# Patient Record
Sex: Female | Born: 1935 | Race: White | Hispanic: No | Smoking: Former smoker
Health system: Southern US, Community
[De-identification: ages and names within clinical notes are randomized; demographics above are authoritative.]

## PROBLEM LIST (undated history)

## (undated) DIAGNOSIS — R0602 Shortness of breath: Secondary | ICD-10-CM

## (undated) DIAGNOSIS — H269 Unspecified cataract: Secondary | ICD-10-CM

## (undated) DIAGNOSIS — IMO0001 Reserved for inherently not codable concepts without codable children: Secondary | ICD-10-CM

## (undated) DIAGNOSIS — M545 Low back pain, unspecified: Secondary | ICD-10-CM

## (undated) DIAGNOSIS — F329 Major depressive disorder, single episode, unspecified: Secondary | ICD-10-CM

## (undated) DIAGNOSIS — I350 Nonrheumatic aortic (valve) stenosis: Secondary | ICD-10-CM

## (undated) DIAGNOSIS — E7800 Pure hypercholesterolemia, unspecified: Secondary | ICD-10-CM

## (undated) DIAGNOSIS — E119 Type 2 diabetes mellitus without complications: Secondary | ICD-10-CM

## (undated) DIAGNOSIS — N183 Chronic kidney disease, stage 3 unspecified: Secondary | ICD-10-CM

## (undated) DIAGNOSIS — J449 Chronic obstructive pulmonary disease, unspecified: Secondary | ICD-10-CM

## (undated) DIAGNOSIS — M1990 Unspecified osteoarthritis, unspecified site: Secondary | ICD-10-CM

## (undated) DIAGNOSIS — E559 Vitamin D deficiency, unspecified: Secondary | ICD-10-CM

## (undated) DIAGNOSIS — G709 Myoneural disorder, unspecified: Secondary | ICD-10-CM

## (undated) DIAGNOSIS — D509 Iron deficiency anemia, unspecified: Secondary | ICD-10-CM

## (undated) DIAGNOSIS — H35059 Retinal neovascularization, unspecified, unspecified eye: Secondary | ICD-10-CM

## (undated) DIAGNOSIS — H353290 Exudative age-related macular degeneration, unspecified eye, stage unspecified: Secondary | ICD-10-CM

## (undated) DIAGNOSIS — I447 Left bundle-branch block, unspecified: Secondary | ICD-10-CM

## (undated) DIAGNOSIS — K219 Gastro-esophageal reflux disease without esophagitis: Secondary | ICD-10-CM

## (undated) DIAGNOSIS — G8929 Other chronic pain: Secondary | ICD-10-CM

## (undated) DIAGNOSIS — F32A Depression, unspecified: Secondary | ICD-10-CM

## (undated) DIAGNOSIS — F0280 Dementia in other diseases classified elsewhere without behavioral disturbance: Secondary | ICD-10-CM

## (undated) DIAGNOSIS — H3530 Unspecified macular degeneration: Secondary | ICD-10-CM

## (undated) DIAGNOSIS — Z0389 Encounter for observation for other suspected diseases and conditions ruled out: Secondary | ICD-10-CM

## (undated) DIAGNOSIS — M1710 Unilateral primary osteoarthritis, unspecified knee: Secondary | ICD-10-CM

## (undated) DIAGNOSIS — I10 Essential (primary) hypertension: Secondary | ICD-10-CM

## (undated) DIAGNOSIS — I480 Paroxysmal atrial fibrillation: Secondary | ICD-10-CM

## (undated) DIAGNOSIS — I5042 Chronic combined systolic (congestive) and diastolic (congestive) heart failure: Secondary | ICD-10-CM

## (undated) DIAGNOSIS — G309 Alzheimer's disease, unspecified: Secondary | ICD-10-CM

## (undated) DIAGNOSIS — E785 Hyperlipidemia, unspecified: Secondary | ICD-10-CM

## (undated) DIAGNOSIS — I428 Other cardiomyopathies: Secondary | ICD-10-CM

## (undated) HISTORY — DX: Reserved for inherently not codable concepts without codable children: IMO0001

## (undated) HISTORY — DX: Low back pain: M54.5

## (undated) HISTORY — DX: Gastro-esophageal reflux disease without esophagitis: K21.9

## (undated) HISTORY — DX: Chronic kidney disease, stage 3 unspecified: N18.30

## (undated) HISTORY — DX: Pure hypercholesterolemia, unspecified: E78.00

## (undated) HISTORY — DX: Type 2 diabetes mellitus without complications: E11.9

## (undated) HISTORY — DX: Low back pain, unspecified: M54.50

## (undated) HISTORY — DX: Other cardiomyopathies: I42.8

## (undated) HISTORY — DX: Iron deficiency anemia, unspecified: D50.9

## (undated) HISTORY — DX: Alzheimer's disease, unspecified: G30.9

## (undated) HISTORY — DX: Dementia in other diseases classified elsewhere, unspecified severity, without behavioral disturbance, psychotic disturbance, mood disturbance, and anxiety: F02.80

## (undated) HISTORY — DX: Major depressive disorder, single episode, unspecified: F32.9

## (undated) HISTORY — DX: Depression, unspecified: F32.A

## (undated) HISTORY — DX: Exudative age-related macular degeneration, unspecified eye, stage unspecified: H35.3290

## (undated) HISTORY — DX: Chronic obstructive pulmonary disease, unspecified: J44.9

## (undated) HISTORY — DX: Unilateral primary osteoarthritis, unspecified knee: M17.10

## (undated) HISTORY — DX: Vitamin D deficiency, unspecified: E55.9

## (undated) HISTORY — PX: TONSILLECTOMY: SUR1361

## (undated) HISTORY — DX: Unspecified osteoarthritis, unspecified site: M19.90

## (undated) HISTORY — DX: Unspecified cataract: H26.9

## (undated) HISTORY — DX: Chronic kidney disease, stage 3 (moderate): N18.3

## (undated) HISTORY — DX: Retinal neovascularization, unspecified, unspecified eye: H35.059

## (undated) HISTORY — DX: Paroxysmal atrial fibrillation: I48.0

## (undated) HISTORY — DX: Hyperlipidemia, unspecified: E78.5

## (undated) HISTORY — DX: Chronic combined systolic (congestive) and diastolic (congestive) heart failure: I50.42

## (undated) HISTORY — DX: Nonrheumatic aortic (valve) stenosis: I35.0

## (undated) HISTORY — DX: Encounter for observation for other suspected diseases and conditions ruled out: Z03.89

## (undated) HISTORY — DX: Essential (primary) hypertension: I10

## (undated) HISTORY — DX: Left bundle-branch block, unspecified: I44.7

---
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
## 1968-03-15 HISTORY — PX: ABDOMINAL HYSTERECTOMY: SUR658

## 1995-03-16 HISTORY — PX: KNEE SURGERY: SHX244

## 2005-11-16 ENCOUNTER — Encounter: Admission: RE | Admit: 2005-11-16 | Discharge: 2005-11-16 | Payer: Self-pay | Admitting: Internal Medicine

## 2005-12-28 ENCOUNTER — Encounter: Admission: RE | Admit: 2005-12-28 | Discharge: 2005-12-28 | Payer: Self-pay | Admitting: Internal Medicine

## 2006-06-26 ENCOUNTER — Inpatient Hospital Stay (HOSPITAL_COMMUNITY): Admission: EM | Admit: 2006-06-26 | Discharge: 2006-06-30 | Payer: Self-pay | Admitting: Emergency Medicine

## 2006-06-27 ENCOUNTER — Encounter (INDEPENDENT_AMBULATORY_CARE_PROVIDER_SITE_OTHER): Payer: Self-pay | Admitting: Cardiology

## 2006-06-28 ENCOUNTER — Ambulatory Visit: Payer: Self-pay | Admitting: Infectious Diseases

## 2007-10-27 ENCOUNTER — Encounter: Payer: Self-pay | Admitting: Cardiovascular Disease

## 2007-10-27 ENCOUNTER — Inpatient Hospital Stay (HOSPITAL_COMMUNITY): Admission: EM | Admit: 2007-10-27 | Discharge: 2007-10-29 | Payer: Self-pay | Admitting: Emergency Medicine

## 2007-12-25 ENCOUNTER — Inpatient Hospital Stay (HOSPITAL_COMMUNITY): Admission: EM | Admit: 2007-12-25 | Discharge: 2007-12-30 | Payer: Self-pay | Admitting: Emergency Medicine

## 2008-01-08 ENCOUNTER — Encounter: Admission: RE | Admit: 2008-01-08 | Discharge: 2008-01-08 | Payer: Self-pay | Admitting: Gastroenterology

## 2008-06-10 ENCOUNTER — Emergency Department (HOSPITAL_COMMUNITY): Admission: EM | Admit: 2008-06-10 | Discharge: 2008-06-10 | Payer: Self-pay | Admitting: Emergency Medicine

## 2009-01-09 ENCOUNTER — Ambulatory Visit: Payer: Self-pay | Admitting: Pulmonary Disease

## 2009-01-09 DIAGNOSIS — J309 Allergic rhinitis, unspecified: Secondary | ICD-10-CM | POA: Insufficient documentation

## 2009-01-09 DIAGNOSIS — R0602 Shortness of breath: Secondary | ICD-10-CM | POA: Insufficient documentation

## 2009-01-09 DIAGNOSIS — E119 Type 2 diabetes mellitus without complications: Secondary | ICD-10-CM | POA: Insufficient documentation

## 2009-01-09 DIAGNOSIS — I5040 Unspecified combined systolic (congestive) and diastolic (congestive) heart failure: Secondary | ICD-10-CM

## 2009-01-09 DIAGNOSIS — E785 Hyperlipidemia, unspecified: Secondary | ICD-10-CM

## 2009-01-09 DIAGNOSIS — I10 Essential (primary) hypertension: Secondary | ICD-10-CM | POA: Insufficient documentation

## 2009-01-25 ENCOUNTER — Inpatient Hospital Stay (HOSPITAL_COMMUNITY): Admission: EM | Admit: 2009-01-25 | Discharge: 2009-01-27 | Payer: Self-pay | Admitting: Emergency Medicine

## 2009-01-27 ENCOUNTER — Encounter (INDEPENDENT_AMBULATORY_CARE_PROVIDER_SITE_OTHER): Payer: Self-pay | Admitting: Emergency Medicine

## 2009-02-14 ENCOUNTER — Encounter: Payer: Self-pay | Admitting: Pulmonary Disease

## 2009-02-14 ENCOUNTER — Ambulatory Visit: Payer: Self-pay | Admitting: Pulmonary Disease

## 2009-03-13 ENCOUNTER — Encounter: Admission: RE | Admit: 2009-03-13 | Discharge: 2009-03-13 | Payer: Self-pay | Admitting: Internal Medicine

## 2009-03-24 ENCOUNTER — Encounter (HOSPITAL_COMMUNITY): Admission: RE | Admit: 2009-03-24 | Discharge: 2009-06-18 | Payer: Self-pay | Admitting: Cardiovascular Disease

## 2009-07-08 ENCOUNTER — Ambulatory Visit: Payer: Self-pay | Admitting: Pulmonary Disease

## 2009-12-24 ENCOUNTER — Encounter: Admission: RE | Admit: 2009-12-24 | Discharge: 2009-12-24 | Payer: Self-pay | Admitting: Internal Medicine

## 2009-12-31 ENCOUNTER — Ambulatory Visit: Payer: Self-pay | Admitting: Oncology

## 2010-01-03 ENCOUNTER — Inpatient Hospital Stay (HOSPITAL_COMMUNITY): Admission: EM | Admit: 2010-01-03 | Discharge: 2010-01-06 | Payer: Self-pay | Admitting: Emergency Medicine

## 2010-01-05 ENCOUNTER — Encounter (INDEPENDENT_AMBULATORY_CARE_PROVIDER_SITE_OTHER): Payer: Self-pay | Admitting: Cardiovascular Disease

## 2010-02-13 ENCOUNTER — Encounter: Payer: Self-pay | Admitting: Pulmonary Disease

## 2010-03-18 ENCOUNTER — Encounter
Admission: RE | Admit: 2010-03-18 | Discharge: 2010-03-18 | Payer: Self-pay | Source: Home / Self Care | Attending: Internal Medicine | Admitting: Internal Medicine

## 2010-04-04 ENCOUNTER — Encounter (HOSPITAL_BASED_OUTPATIENT_CLINIC_OR_DEPARTMENT_OTHER): Payer: Self-pay | Admitting: Internal Medicine

## 2010-04-05 ENCOUNTER — Encounter (HOSPITAL_BASED_OUTPATIENT_CLINIC_OR_DEPARTMENT_OTHER): Payer: Self-pay | Admitting: Internal Medicine

## 2010-04-14 NOTE — Assessment & Plan Note (Signed)
Summary: rov for dyspnea   Copy to:  [name] Primary Provider/Referring Provider:  [name]  CC:  Dyspnea follow-up.  The patient says she is doing well off Spiriva and Advair. She is not having to use her Proair much but does say her breathing is a little worse  due to the pollen.Marland Kitchen  History of Present Illness: The pt comes in today for f/u of her dyspnea.  At the last visit, she was taken off maintenance bronchodilators due to minimal abnormalities on her pfts.  She has been using as needed proair, and feels she is doing well with this.  She uses only intermittantly, and has not used in over a week.  She denies cough, congestion, or mucus.  She has been thru cardiac rehab, and feels it has helped her.  Current Medications (verified): 1)  Lantus 100 Unit/ml Soln (Insulin Glargine) .... Use As Directed 2)  Metoprolol Tartrate 25 Mg Tabs (Metoprolol Tartrate) .... Take 1 Tablet By Mouth Two Times A Day 3)  Furosemide 20 Mg Tabs (Furosemide) .... Take 1 Tablet By Mouth Two Times A Day 4)  Bupropion Hcl 75 Mg Tabs (Bupropion Hcl) .... Take 2 Tabs( Total of 150mg )  By Mouth Two Times A Day 5)  Calcium + Vitamin D .... Take 1 Tablet By Mouth Two Times A Day 6)  Lisinopril 20 Mg Tabs (Lisinopril) .... Take 1 Tablet By Mouth Once A Day 7)  Lovastatin 40 Mg Tabs (Lovastatin) .... Take 1 Tablet By Mouth Once A Day 8)  Hydrocodone 500mg  .... Take 1 Tab By Mouth Every 6 Hours As Needed 9)  Famotidine 20 Mg Tabs (Famotidine) .... Take 1 Tablet By Mouth Two Times A Day 10)  Proair Hfa 108 (90 Base) Mcg/act  Aers (Albuterol Sulfate) .Marland Kitchen.. 1-2 Puffs Every 4-6 Hours As Needed 11)  Nystatin Cream .... Use Two Times A Day As Needed 12)  Amlodipine Besylate 2.5 Mg Tabs (Amlodipine Besylate) .... Take 1 Tablet By Mouth Once A Day 13)  Colace 100 Mg Caps (Docusate Sodium) .... Take 1 Tablet By Mouth Once A Day  Allergies (verified): 1)  ! Paxil  Review of Systems      See HPI  Vital  Signs:  Patient profile:   77 year old female Height:      62 inches (157.48 cm) Weight:      207 pounds (94.09 kg) BMI:     38.00 O2 Sat:      98 % on Room air Temp:     98.0 degrees F (36.67 degrees C) oral Pulse rate:   98 / minute BP sitting:   126 / 80  (right arm) Cuff size:   regular  Vitals Entered By: Michel Bickers CMA (July 08, 2009 9:09 AM)  O2 Sat at Rest %:  98 O2 Flow:  Room air CC: Dyspnea follow-up.  The patient says she is doing well off Spiriva and Advair. She is not having to use her Proair much but does say her breathing is a little worse  due to the pollen. Comments Medications reviewed with the patient. Michel Bickers CMA  July 08, 2009 9:10 AM   Physical Exam  General:  obese female in nad Lungs:  clear to auscultation Heart:  rrr   Impression & Recommendations:  Problem # 1:  DYSPNEA (ICD-786.05)  multifactorial, and related to her weight, deconditioning, and chronic heart disease.  She has minimal airtrapping on pfts and no fixed airflow obstruction by spirometry.  She has been doing well on as needed albuterol alone, and uses it rarely.  She has participated in cardiac rehab and has lost 3 pounds since last visit, and feels better.  I will see her on an as needed basis.  Other Orders: Est. Patient Level II (16109)  Patient Instructions: 1)  continue with proair as needed. 2)  work on weight loss and conditioning 3)  followup with me as needed.

## 2010-04-22 NOTE — Letter (Signed)
Summary: [facility]  [facility]   Imported By: Lester Chalfant 04/16/2010 10:18:59  _____________________________________________________________________  External Attachment:    Type:   Image     Comment:   External Document

## 2010-04-29 ENCOUNTER — Inpatient Hospital Stay (HOSPITAL_COMMUNITY)
Admission: EM | Admit: 2010-04-29 | Discharge: 2010-05-04 | DRG: 683 | Disposition: A | Discharge status: Home Health | Payer: MEDICARE | Attending: Internal Medicine | Admitting: Internal Medicine

## 2010-04-29 ENCOUNTER — Emergency Department (HOSPITAL_COMMUNITY): Payer: MEDICARE

## 2010-04-29 DIAGNOSIS — Z87891 Personal history of nicotine dependence: Secondary | ICD-10-CM

## 2010-04-29 DIAGNOSIS — Z96659 Presence of unspecified artificial knee joint: Secondary | ICD-10-CM

## 2010-04-29 DIAGNOSIS — E860 Dehydration: Secondary | ICD-10-CM | POA: Diagnosis present

## 2010-04-29 DIAGNOSIS — E785 Hyperlipidemia, unspecified: Secondary | ICD-10-CM | POA: Diagnosis present

## 2010-04-29 DIAGNOSIS — J4489 Other specified chronic obstructive pulmonary disease: Secondary | ICD-10-CM | POA: Diagnosis present

## 2010-04-29 DIAGNOSIS — Z7901 Long term (current) use of anticoagulants: Secondary | ICD-10-CM

## 2010-04-29 DIAGNOSIS — I10 Essential (primary) hypertension: Secondary | ICD-10-CM | POA: Diagnosis present

## 2010-04-29 DIAGNOSIS — I447 Left bundle-branch block, unspecified: Secondary | ICD-10-CM | POA: Diagnosis present

## 2010-04-29 DIAGNOSIS — I428 Other cardiomyopathies: Secondary | ICD-10-CM | POA: Diagnosis present

## 2010-04-29 DIAGNOSIS — J449 Chronic obstructive pulmonary disease, unspecified: Secondary | ICD-10-CM | POA: Diagnosis present

## 2010-04-29 DIAGNOSIS — N179 Acute kidney failure, unspecified: Principal | ICD-10-CM | POA: Diagnosis present

## 2010-04-29 DIAGNOSIS — I4891 Unspecified atrial fibrillation: Secondary | ICD-10-CM | POA: Diagnosis present

## 2010-04-29 DIAGNOSIS — Z79899 Other long term (current) drug therapy: Secondary | ICD-10-CM

## 2010-04-29 DIAGNOSIS — I5032 Chronic diastolic (congestive) heart failure: Secondary | ICD-10-CM | POA: Diagnosis present

## 2010-04-29 DIAGNOSIS — Z880 Allergy status to penicillin: Secondary | ICD-10-CM

## 2010-04-29 DIAGNOSIS — N390 Urinary tract infection, site not specified: Secondary | ICD-10-CM | POA: Diagnosis present

## 2010-04-29 DIAGNOSIS — I959 Hypotension, unspecified: Secondary | ICD-10-CM | POA: Diagnosis present

## 2010-04-29 DIAGNOSIS — Z794 Long term (current) use of insulin: Secondary | ICD-10-CM

## 2010-04-29 DIAGNOSIS — E119 Type 2 diabetes mellitus without complications: Secondary | ICD-10-CM | POA: Diagnosis present

## 2010-04-29 DIAGNOSIS — E7800 Pure hypercholesterolemia, unspecified: Secondary | ICD-10-CM | POA: Diagnosis present

## 2010-04-29 LAB — CBC
Hemoglobin: 12.1 g/dL (ref 12.0–15.0)
MCH: 32.9 pg (ref 26.0–34.0)
Platelets: 123 10*3/uL — ABNORMAL LOW (ref 150–400)
RBC: 3.68 MIL/uL — ABNORMAL LOW (ref 3.87–5.11)
WBC: 7.1 10*3/uL (ref 4.0–10.5)

## 2010-04-29 LAB — BASIC METABOLIC PANEL
CO2: 27 mEq/L (ref 19–32)
Chloride: 100 mEq/L (ref 96–112)
Creatinine, Ser: 2.73 mg/dL — ABNORMAL HIGH (ref 0.4–1.2)
GFR calc Af Amer: 20 mL/min — ABNORMAL LOW (ref >60)
Potassium: 3.2 mEq/L — ABNORMAL LOW (ref 3.5–5.1)
Sodium: 142 mEq/L (ref 135–145)

## 2010-04-29 LAB — DIFFERENTIAL
Basophils Relative: 1 % (ref 0–1)
Lymphs Abs: 1.1 10*3/uL (ref 0.7–4.0)
Monocytes Relative: 12 % (ref 3–12)
Neutro Abs: 4.9 10*3/uL (ref 1.7–7.7)
Neutrophils Relative %: 70 % (ref 43–77)

## 2010-04-30 ENCOUNTER — Emergency Department (HOSPITAL_COMMUNITY): Payer: MEDICARE

## 2010-04-30 LAB — CREATININE, URINE, RANDOM: Creatinine, Urine: 49.5 mg/dL

## 2010-04-30 LAB — AMMONIA: Ammonia: 80 umol/L — ABNORMAL HIGH (ref 11–35)

## 2010-04-30 LAB — CK TOTAL AND CKMB (NOT AT ARMC)
CK, MB: 8.8 ng/mL (ref 0.3–4.0)
Relative Index: 2.4 (ref 0.0–2.5)

## 2010-04-30 LAB — URINALYSIS, ROUTINE W REFLEX MICROSCOPIC
Hgb urine dipstick: NEGATIVE
Specific Gravity, Urine: 1.013 (ref 1.005–1.030)
Urine Glucose, Fasting: NEGATIVE mg/dL
pH: 6.5 (ref 5.0–8.0)

## 2010-04-30 LAB — LIPID PANEL: VLDL: 17 mg/dL (ref 0–40)

## 2010-04-30 LAB — URINE MICROSCOPIC-ADD ON

## 2010-04-30 LAB — COMPREHENSIVE METABOLIC PANEL
ALT: 18 U/L (ref 0–35)
Albumin: 3.5 g/dL (ref 3.5–5.2)
Alkaline Phosphatase: 48 U/L (ref 39–117)
BUN: 86 mg/dL — ABNORMAL HIGH (ref 6–23)
Calcium: 9.2 mg/dL (ref 8.4–10.5)
Glucose, Bld: 215 mg/dL — ABNORMAL HIGH (ref 70–99)
Potassium: 3.7 mEq/L (ref 3.5–5.1)
Sodium: 145 mEq/L (ref 135–145)
Total Protein: 5.9 g/dL — ABNORMAL LOW (ref 6.0–8.3)

## 2010-04-30 LAB — CBC
Hemoglobin: 10.7 g/dL — ABNORMAL LOW (ref 12.0–15.0)
MCH: 32.3 pg (ref 26.0–34.0)
Platelets: 94 10*3/uL — ABNORMAL LOW (ref 150–400)
RBC: 3.31 MIL/uL — ABNORMAL LOW (ref 3.87–5.11)

## 2010-04-30 LAB — CARDIAC PANEL(CRET KIN+CKTOT+MB+TROPI)
Relative Index: 2.5 (ref 0.0–2.5)
Relative Index: 2.5 (ref 0.0–2.5)
Total CK: 536 U/L — ABNORMAL HIGH (ref 7–177)
Troponin I: 0.02 ng/mL (ref 0.00–0.06)
Troponin I: 0.04 ng/mL (ref 0.00–0.06)

## 2010-04-30 LAB — LIPASE, BLOOD: Lipase: 22 U/L (ref 11–59)

## 2010-04-30 LAB — GLUCOSE, CAPILLARY
Glucose-Capillary: 118 mg/dL — ABNORMAL HIGH (ref 70–99)
Glucose-Capillary: 173 mg/dL — ABNORMAL HIGH (ref 70–99)

## 2010-04-30 LAB — SODIUM, URINE, RANDOM: Sodium, Ur: 58 mEq/L

## 2010-04-30 LAB — TROPONIN I: Troponin I: 0.02 ng/mL (ref 0.00–0.06)

## 2010-04-30 LAB — TSH: TSH: 0.706 u[IU]/mL (ref 0.350–4.500)

## 2010-04-30 LAB — MAGNESIUM: Magnesium: 2.4 mg/dL (ref 1.5–2.5)

## 2010-04-30 LAB — PROTIME-INR: INR: 2.55 — ABNORMAL HIGH (ref 0.00–1.49)

## 2010-05-01 LAB — CBC
HCT: 29.2 % — ABNORMAL LOW (ref 36.0–46.0)
Hemoglobin: 10 g/dL — ABNORMAL LOW (ref 12.0–15.0)
MCHC: 34.2 g/dL (ref 30.0–36.0)
MCV: 94.5 fL (ref 78.0–100.0)
RDW: 13.7 % (ref 11.5–15.5)

## 2010-05-01 LAB — PROTIME-INR: INR: 2.72 — ABNORMAL HIGH (ref 0.00–1.49)

## 2010-05-01 LAB — URINE CULTURE: Culture  Setup Time: 201202160109

## 2010-05-01 LAB — GLUCOSE, CAPILLARY
Glucose-Capillary: 184 mg/dL — ABNORMAL HIGH (ref 70–99)
Glucose-Capillary: 187 mg/dL — ABNORMAL HIGH (ref 70–99)
Glucose-Capillary: 155 mg/dL — ABNORMAL HIGH (ref 70–99)

## 2010-05-01 LAB — BASIC METABOLIC PANEL
BUN: 71 mg/dL — ABNORMAL HIGH (ref 6–23)
CO2: 27 mEq/L (ref 19–32)
Chloride: 113 mEq/L — ABNORMAL HIGH (ref 96–112)
GFR calc Af Amer: 36 mL/min — ABNORMAL LOW (ref >60)
Potassium: 3.8 mEq/L (ref 3.5–5.1)

## 2010-05-02 LAB — PROTIME-INR
INR: 2.72 — ABNORMAL HIGH (ref 0.00–1.49)
Prothrombin Time: 28.9 seconds — ABNORMAL HIGH (ref 11.6–15.2)

## 2010-05-02 LAB — BASIC METABOLIC PANEL
BUN: 36 mg/dL — ABNORMAL HIGH (ref 6–23)
Calcium: 8.3 mg/dL — ABNORMAL LOW (ref 8.4–10.5)
Creatinine, Ser: 1.17 mg/dL (ref 0.4–1.2)
GFR calc non Af Amer: 45 mL/min — ABNORMAL LOW (ref >60)
Glucose, Bld: 116 mg/dL — ABNORMAL HIGH (ref 70–99)

## 2010-05-02 LAB — GLUCOSE, CAPILLARY
Glucose-Capillary: 129 mg/dL — ABNORMAL HIGH (ref 70–99)
Glucose-Capillary: 148 mg/dL — ABNORMAL HIGH (ref 70–99)

## 2010-05-02 LAB — CBC
MCH: 33 pg (ref 26.0–34.0)
MCHC: 34.6 g/dL (ref 30.0–36.0)
Platelets: 70 10*3/uL — ABNORMAL LOW (ref 150–400)

## 2010-05-03 LAB — GLUCOSE, CAPILLARY
Glucose-Capillary: 94 mg/dL (ref 70–99)
Glucose-Capillary: 144 mg/dL — ABNORMAL HIGH (ref 70–99)
Glucose-Capillary: 176 mg/dL — ABNORMAL HIGH (ref 70–99)
Glucose-Capillary: 126 mg/dL — ABNORMAL HIGH (ref 70–99)

## 2010-05-03 LAB — BASIC METABOLIC PANEL
Chloride: 116 mEq/L — ABNORMAL HIGH (ref 96–112)
Creatinine, Ser: 0.96 mg/dL (ref 0.4–1.2)
GFR calc Af Amer: >60 mL/min (ref >60)
Potassium: 4.6 mEq/L (ref 3.5–5.1)
Sodium: 145 mEq/L (ref 135–145)

## 2010-05-03 LAB — PROTIME-INR: INR: 3.1 — ABNORMAL HIGH (ref 0.00–1.49)

## 2010-05-04 LAB — CBC: WBC: 6.5 10*3/uL (ref 4.0–10.5)

## 2010-05-04 LAB — GLUCOSE, CAPILLARY
Glucose-Capillary: 128 mg/dL — ABNORMAL HIGH (ref 70–99)
Glucose-Capillary: 117 mg/dL — ABNORMAL HIGH (ref 70–99)

## 2010-05-04 LAB — PROTIME-INR
INR: 2.13 — ABNORMAL HIGH (ref 0.00–1.49)
Prothrombin Time: 24 seconds — ABNORMAL HIGH (ref 11.6–15.2)

## 2010-05-04 LAB — BASIC METABOLIC PANEL
GFR calc Af Amer: >60 mL/min (ref >60)
GFR calc non Af Amer: 58 mL/min — ABNORMAL LOW (ref >60)
Potassium: 3.9 mEq/L (ref 3.5–5.1)
Sodium: 144 mEq/L (ref 135–145)

## 2010-05-11 NOTE — H&P (Signed)
NAMEVICTORIAN, [name]              ACCOUNT NO.:  [id number]  MEDICAL RECORD NO.:  [id number]           PATIENT TYPE:  E  LOCATION:  [facility]                         FACILITY:  [facility]  PHYSICIAN:  Eduard Clos, MDDATE OF BIRTH:  Nov 08, 1929  DATE OF ADMISSION:  04/29/2010 DATE OF DISCHARGE:                             HISTORY & PHYSICAL   PRIMARY CARE PHYSICIAN:  [name], MD  CHIEF COMPLAINT:  Weakness and confusion.  History obtained from the patient's daughter, ER physician, and previous charts.  HISTORY OF PRESENT ILLNESS:  A 77 year old female with known history of cardiomyopathy with last EF measured in October of 2011, was 45%, history of COPD, hypertension, chronic left bundle-branch block, diabetes mellitus type 2, hyperlipidemia, was brought into ER, the patient was found to be increasingly weak and confused over last 3-4 days.  As per patient's daughter, patient was found herself feeling very weak and not eating well, nauseated, did not throw up, has not had any abdominal pain or chest pain, did not have shortness of breath.  Did not have fever or chills.  Did not lose consciousness or any focal deficit.  In the ER, the patient was found to have mildly hypotensive with a creatinine increasing from her baseline.  The patient was given some fluid and was found to have UTI.  At this time, the patient has been admitted for ARF with dehydration and UTI.  PAST MEDICAL HISTORY:  COPD, hyperlipidemia, diabetes mellitus type 2, hypertension, chronic left bundle-branch block, and systolic and diastolic CHF with last EF measured in October 2011 was 45%.  MEDICATIONS ON ADMISSION: 1. The patient is on albuterol inhaler four times as needed. 2. Nystatin twice daily. 3. Metoprolol 25 mg twice daily. 4. Lovastatin 40 mg daily. 5. Lisinopril 20 mg daily. 6. Lantus insulin 23 units b.i.d. 7. Imdur 30 mg daily. 8. Hydrocodone/acetaminophen q.6 p.r.n. 9. Furosemide 40 mg  daily. 10.Famotidine 20 mg twice daily. 11.Colace 1-2 tablets twice daily. 12.Citalopram 20 mg daily. 13.Caltrate.  ALLERGIES:  PAROXETINE and TRAMADOL.  SOCIAL HISTORY:  The patient quit smoking 3 years ago, drinks or alcohol occasionally on the weekend, has no history of drug abuse.  Lives with her daughter, at this time is a full code.  FAMILY HISTORY:  Significant for father died of MI.  Mother had pulmonary embolism and diabetes, and hypertension in multiple family members.  REVIEW OF SYSTEMS:  As per history of present illness nothing else significant.  PHYSICAL EXAMINATION:  GENERAL:  The patient examined at bedside, not in acute distress. VITAL SIGNS:  Blood pressure is now after giving a liter and half bolus is 98/50, pulse is 74, temperature 97.5, respirations 18, O2 sat 98%. HEENT:  Anicteric.  No pallor.  No discharge from ears, eyes, nose, or mouth. CHEST:  Bilateral air entry present.  No rhonchi, no crepitation. Heart:  S1 and S2 heard. ABDOMEN:  Soft, nontender.  Bowel sounds heard.  Has some discoloration on the left side of the abdomen. CNS:  The patient is at this time, alert, awake, hard of hearing, oriented x3, moves upper and lower extremities. EXTREMITIES:  Peripheral  pulses felt.  No edema.  LABORATORY DATA:  EKG shows AFib with left bundle-branch block, heart rate of 73 beats per minute.  The patient has known history of LBBB. Chest x-ray shows mild cardiomegaly without acute disease.  CBC, WBC is 7.1, hemoglobin is 12.1, hematocrit is 34.1, platelets 123.  PT/INR is 27.5 and 2.5.  Basic metabolic panel sodium 142, potassium 3.2, chloride 100, carbon dioxide 27, glucose 193, BUN 102, creatinine is 2.7, calcium 10.3.  UA shows large leukocytes, hyaline cast, nitrites positive, wbc 11-20.  ASSESSMENT: 1. Generalized weakness probably from dehydration and hypotension. 2. Acute renal failure from dehydration and hypotension and     medication. 3.  Hypotension from dehydration and medication. 4. Urinary tract infection. 5. Altered mental status probably metabolic reason, has improved at     this time. 6. Thrombocytopenia. 7. History of diastolic and systolic heart failure with last ejection     fraction measured in October 2011 was 45%. 8. Chronic obstructive pulmonary disease. 9. History of atrial fibrillation at this time rate controlled, is on     Coumadin, she is therapeutic. 10.History of chronic left bundle-branch block. 11.Diabetes mellitus type 2.  PLAN: 1. At this time, we will admit the patient to telemetry. 2. The patient for her acute renal failure, has always refused fluid     bolus 1 liter.  At this time, we will continue gently hydration for     another one liter, closely follow intake output, daily weights and     also her BMET.  I am also going to check a urine creatinine and     sodium.  I am going to hold off Lasix, lisinopril, and Imdur as     patient is mildly hypotensive at this time. 3. For urinary tract infection, we will do urine culture.  The patient     has been started on Cipro which we will continue and further dose     of antibiotics based on the urine cultures. 4. History of hypertension presently hypotensive, as discussed     earlier, we will hold off antihypertensive. 5. Altered mental status which is improved at this time would be from     metabolic reasons, I will just get a CT head to make sure there is     no intracranial process. 6. Diabetes mellitus type 2.  The patient has poor p.o. intake at this     time.  We are going to decrease the dose to 10 units b.i.d. with     sensitive sliding scale. 7. Thrombocytopenia.  The patient does have history of     thrombocytopenia in the previous admission in October 2011.  We     will closely follow CBC for this. 8. Atrial fibrillation, at this time is rate controlled.  We will     continue the Coumadin therapeutic. 9. Further recommendation as  condition evolves. 10.Patient is a full code.     Eduard Clos, MD     ANK/MEDQ  D:  04/30/2010  T:  04/30/2010  Job:  [id number]  cc:   [name], MD  Electronically Signed by Midge Minium MD on 05/11/2010 04:58:52 PM

## 2010-05-11 NOTE — H&P (Signed)
  NAMEEVANGELINE, [name]              ACCOUNT NO.:  [id number]  MEDICAL RECORD NO.:  [id number]           PATIENT TYPE:  LOCATION:                                 FACILITY:  PHYSICIAN:  Eduard Clos, MDDATE OF BIRTH:  Nov 21, 1929  DATE OF ADMISSION: DATE OF DISCHARGE:                             HISTORY & PHYSICAL   ADDENDUM  At this time, I am also ordering LFTs and lipase.  If the LFTs are increased, we may have to get a sonogram of the abdomen.  At this time, the patient's abdomen is benign, has no tenderness.     Eduard Clos, MD     ANK/MEDQ  D:  04/30/2010  T:  04/30/2010  Job:  [id number]  Electronically Signed by Midge Minium MD on 05/11/2010 04:58:58 PM

## 2010-05-22 NOTE — Discharge Summary (Signed)
[name], [name]              ACCOUNT NO.:  [id number]  MEDICAL RECORD NO.:  [id number]           PATIENT TYPE:  I  LOCATION:  6733                         FACILITY:  [facility]  PHYSICIAN:  Isidor Holts, M.D.  DATE OF BIRTH:  [birth year]  DATE OF ADMISSION:  04/29/2010 DATE OF DISCHARGE:  05/04/2010                              DISCHARGE SUMMARY   PRIMARY MD:  [name], MD  DISCHARGE DIAGNOSES: 1. Dehydration, volume depletion, and acute renal failure. 2. Urinary tract infection. 3. Confusion/delirium secondary to dehydration, volume depletion,     acute renal failure, and urinary tract infection. 4. Type 2 diabetes mellitus. 5. Dyslipidemia. 6. Chronic obstructive pulmonary disease. 7. History of hypertension. 8. History of left bundle-branch block. 9. Diastolic congestive cardiomyopathy, ejection fraction 45%, January     2011. 10.Atrial fibrillation, on chronic anticoagulation.  DISCHARGE MEDICATIONS: 1. Ciprofloxacin 500 mg p.o. b.i.d. for 2 days only. 2. Lantus 15 units subcutaneously b.i.d. (was on 23 units     subcutaneously b.i.d.). 3. Albuterol inhaler (90 mcg) 2 puffs p.r.n. q.i.d. for shortness of     breath. 4. Caltrate plus vitamin D and magnesium OTC 1 tablet p.o. daily. 5. Citalopram 20 mg p.o. q.a.m. 6. Colace OTC 1 tablet p.o. p.r.n. b.i.d. for constipation. 7. Famotidine 20 mg p.o. b.i.d. 8. Furosemide 40 mg p.o. daily. 9. Hydrocodone/APAP (5/500) one p.o. p.r.n. q.6 h. for arthritis pain. 10.Imdur 30 mg p.o. daily. 11.Lovastatin 40 mg p.o. daily. 12.Metoprolol tartrate 25 mg p.o. b.i.d. 13.Nystatin topical (100,000 units/g) 1 application topically to     affected areas of skin b.i.d. 14.Coumadin 5 mg p.o. at 6 p.m. daily on Mondays and Fridays, 2.5 mg     p.o. at 6 p.m. daily on other days.  Note: Lisinopril has been discontinued secondary to hypotension.  PROCEDURES: 1. Chest x-ray April 29, 2010.  This showed mild cardiomegaly  without acute disease. 2. Head CT scan April 30, 2010.  This showed no acute abnormality.  CONSULTATIONS:  None.  ADMISSION HISTORY:  As in H and P notes of April 29, 2010, dictated by Dr. Midge Minium.  However in brief, this is a 77 year old female, with known history of type 2 diabetes mellitus, hypertension, chronic left bundle-branch block, COPD, congestive cardiomyopathy/diastolic dysfunction, ejection fraction 45% in October 2011, dyslipidemia, presenting with increasing weakness and confusion over 3-4 days.  She was subsequently admitted for further evaluation, investigation, and management.  CLINICAL COURSE: 1. Dehydration/volume depletion and acute renal failure.  The     patient's BUN was found to be markedly elevated at 102 with     creatinine of 2.7.  Baseline creatinine is known to be between 0.84-     1.2.  The patient at the same time, was also found to be hypotensive     with a BP of 98/52 after a liter and half bolus of intravenous     fluids.  She was commenced on aggressive intravenous fluid     hydration with normal saline.  Nitrates and ACE inhibitor as well     as diuretics, were placed on hold.  Over the course of her  hospitalization, hydration status improved, as did mental status and     renal indices.  By May 03, 2010, BUN was 29, creatinine 0.96.     Intravenous fluids were therefore discontinued, and on May 04, 2010, BUN was 21, creatinine 0.94.  She did experience mild     shortness of breath in the early a.m. of May 04, 2010, likely     secondary to mild fluid overload.  This was readily addressed with     single intravenous dose of Lasix, with complete resolution.  2. UTI.  The patient did present with positive urinary sediment,     consistent with urinary tract infection.  She was treated with     ciprofloxacin, and although subsequent urine cultures grew multiple     bacterial morphotypes, given her age as well as  history of diabetes     mellitus, this will be considered as complicated UTI.  We therefore     have elected to complete a full 7-day course of ciprofloxacin     treatment.  She had no recorded episodes of pyrexia during this     hospitalization.  3. Altered mental status/confusion.  This was likely secondary to     delirium against a background of the patient's acute medical problems.  By     day #2 of hospitalization, mental status had reverted to baseline.  4. Type 2 diabetes mellitus.  The patient remained euglycemic     throughout the course of her hospitalization on Lantus insulin,     sliding scale insulin coverage, and appropriate carbohydrate-     modified diet.  CBG's remained normal on 15 units of Lantus b.i.d.     She has therefore been discharged on this dose.  5. Dyslipidemia.  The patient continues on statin treatment.  6. COPD.  This did not prove problematic.  7. Diastolic cardiomyopathy.  As described above, the patient did have     mild fluid overload in the early a.m. of May 04, 2010.  During     the course of hospitalization, however, there were no problems     referable to congestive heart failure.  8. Atrial fibrillation.  The patient has remained rate controlled on     beta-blocker treatment.  Anticoagulation was managed by clinical     pharmacologist during her hospital stay.  9. Hypertension.  The patient did present with hypotension,     necessitating holding her Lasix and ACE inhibitor as well as Imdur.     However, by May 04, 2010, BP had normalized at 121/75.  We     have reinstated her Imdur and of course her diuretics, but have     recommended discontinuation of lisinopril, until reevaluated by her     primary MD.  DISPOSITION:  The patient was considered clinically stable for discharge on May 04, 2010.  She was discharged accordingly.  ACTIVITY:  As tolerated.  Recommended to increase activity slowly.  DIET:   Heart-healthy/carbohydrate modified.  FOLLOWUP INSTRUCTIONS:  The patient is to follow up with her primary MD, Dr. [name], within 1-2 weeks.  She has been instructed to call for an appointment.  She verbalized understanding.     Isidor Holts, M.D.     CO/MEDQ  D:  05/04/2010  T:  05/05/2010  Job:  [id number]  cc:   [name], MD  Electronically Signed by Isidor Holts M.D. on 05/22/2010 01:58:29 PM

## 2010-05-27 LAB — GLUCOSE, CAPILLARY
Glucose-Capillary: 102 mg/dL — ABNORMAL HIGH (ref 70–99)
Glucose-Capillary: 108 mg/dL — ABNORMAL HIGH (ref 70–99)
Glucose-Capillary: 111 mg/dL — ABNORMAL HIGH (ref 70–99)
Glucose-Capillary: 197 mg/dL — ABNORMAL HIGH (ref 70–99)
Glucose-Capillary: 245 mg/dL — ABNORMAL HIGH (ref 70–99)
Glucose-Capillary: 82 mg/dL (ref 70–99)

## 2010-05-27 LAB — CBC
Hemoglobin: 12.3 g/dL (ref 12.0–15.0)
MCHC: 34.6 g/dL (ref 30.0–36.0)
Platelets: 74 10*3/uL — ABNORMAL LOW (ref 150–400)
Platelets: 90 10*3/uL — ABNORMAL LOW (ref 150–400)
RBC: 3.58 MIL/uL — ABNORMAL LOW (ref 3.87–5.11)
RDW: 15.1 % (ref 11.5–15.5)
WBC: 4.1 10*3/uL (ref 4.0–10.5)

## 2010-05-27 LAB — BASIC METABOLIC PANEL
BUN: 25 mg/dL — ABNORMAL HIGH (ref 6–23)
BUN: 31 mg/dL — ABNORMAL HIGH (ref 6–23)
BUN: 32 mg/dL — ABNORMAL HIGH (ref 6–23)
CO2: 24 mEq/L (ref 19–32)
CO2: 26 mEq/L (ref 19–32)
Calcium: 8.7 mg/dL (ref 8.4–10.5)
Calcium: 8.8 mg/dL (ref 8.4–10.5)
Calcium: 9.1 mg/dL (ref 8.4–10.5)
Chloride: 109 mEq/L (ref 96–112)
Creatinine, Ser: 0.95 mg/dL (ref 0.4–1.2)
Creatinine, Ser: 1.05 mg/dL (ref 0.4–1.2)
GFR calc Af Amer: >60 mL/min (ref >60)
GFR calc Af Amer: >60 mL/min (ref >60)
GFR calc non Af Amer: 47 mL/min — ABNORMAL LOW (ref >60)
GFR calc non Af Amer: 51 mL/min — ABNORMAL LOW (ref >60)
GFR calc non Af Amer: >60 mL/min (ref >60)
Glucose, Bld: 134 mg/dL — ABNORMAL HIGH (ref 70–99)
Glucose, Bld: 203 mg/dL — ABNORMAL HIGH (ref 70–99)
Potassium: 3.7 mEq/L (ref 3.5–5.1)
Potassium: 3.9 mEq/L (ref 3.5–5.1)
Sodium: 139 mEq/L (ref 135–145)
Sodium: 141 mEq/L (ref 135–145)
Sodium: 143 mEq/L (ref 135–145)

## 2010-05-27 LAB — POCT CARDIAC MARKERS
CKMB, poc: 2 ng/mL (ref 1.0–8.0)
Myoglobin, poc: 103 ng/mL (ref 12–200)

## 2010-05-27 LAB — LIPID PANEL
Cholesterol: 104 mg/dL (ref 0–200)
HDL: 41 mg/dL (ref >39)
LDL Cholesterol: 54 mg/dL (ref 0–99)
Total CHOL/HDL Ratio: 2.5 RATIO
Triglycerides: 47 mg/dL (ref <150)

## 2010-05-27 LAB — HEPARIN INDUCED THROMBOCYTOPENIA PNL
Patient O.D.: 0.125
UFH Low Dose 0.5 IU/mL: 4 % Release

## 2010-05-27 LAB — DIFFERENTIAL
Basophils Relative: 1 % (ref 0–1)
Monocytes Relative: 12 % (ref 3–12)
Neutro Abs: 5.3 10*3/uL (ref 1.7–7.7)
Neutrophils Relative %: 70 % (ref 43–77)

## 2010-05-27 LAB — HEMOGLOBIN A1C: Hgb A1c MFr Bld: 6 % — ABNORMAL HIGH (ref <5.7)

## 2010-05-29 ENCOUNTER — Inpatient Hospital Stay (INDEPENDENT_AMBULATORY_CARE_PROVIDER_SITE_OTHER)
Admission: RE | Admit: 2010-05-29 | Discharge: 2010-05-29 | Disposition: A | Discharge status: Home / Self Care | Payer: MEDICARE | Source: Ambulatory Visit | Attending: Family Medicine | Admitting: Family Medicine

## 2010-05-29 DIAGNOSIS — R300 Dysuria: Secondary | ICD-10-CM

## 2010-05-29 LAB — POCT URINALYSIS DIPSTICK
Glucose, UA: 250 mg/dL — AB
Ketones, ur: NEGATIVE mg/dL
Specific Gravity, Urine: 1.01 (ref 1.005–1.030)
Urobilinogen, UA: 2 mg/dL — ABNORMAL HIGH (ref 0.0–1.0)

## 2010-05-30 LAB — URINE CULTURE

## 2010-06-17 LAB — CBC
Hemoglobin: 10.4 g/dL — ABNORMAL LOW (ref 12.0–15.0)
MCHC: 34.7 g/dL (ref 30.0–36.0)
MCHC: 35 g/dL (ref 30.0–36.0)
MCHC: 35.1 g/dL (ref 30.0–36.0)
MCV: 93.3 fL (ref 78.0–100.0)
MCV: 94.3 fL (ref 78.0–100.0)
Platelets: 114 10*3/uL — ABNORMAL LOW (ref 150–400)
Platelets: 198 10*3/uL (ref 150–400)
RDW: 13.9 % (ref 11.5–15.5)
RDW: 14.1 % (ref 11.5–15.5)
RDW: 14.2 % (ref 11.5–15.5)

## 2010-06-17 LAB — URINE CULTURE
Colony Count: NO GROWTH
Culture: NO GROWTH

## 2010-06-17 LAB — POCT I-STAT, CHEM 8
Creatinine, Ser: 1 mg/dL (ref 0.4–1.2)
HCT: 33 % — ABNORMAL LOW (ref 36.0–46.0)
Hemoglobin: 11.2 g/dL — ABNORMAL LOW (ref 12.0–15.0)
Potassium: 3.8 mEq/L (ref 3.5–5.1)
Sodium: 142 mEq/L (ref 135–145)
TCO2: 19 mmol/L (ref 0–100)

## 2010-06-17 LAB — CARDIAC PANEL(CRET KIN+CKTOT+MB+TROPI)
CK, MB: 4 ng/mL (ref 0.3–4.0)
CK, MB: 4.5 ng/mL — ABNORMAL HIGH (ref 0.3–4.0)
CK, MB: 4.8 ng/mL — ABNORMAL HIGH (ref 0.3–4.0)
Relative Index: 3 — ABNORMAL HIGH (ref 0.0–2.5)
Relative Index: 3.3 — ABNORMAL HIGH (ref 0.0–2.5)
Total CK: 150 U/L (ref 7–177)
Total CK: 170 U/L (ref 7–177)
Troponin I: 0.25 ng/mL — ABNORMAL HIGH (ref 0.00–0.06)
Troponin I: 0.25 ng/mL — ABNORMAL HIGH (ref 0.00–0.06)
Troponin I: 0.27 ng/mL — ABNORMAL HIGH (ref 0.00–0.06)
Troponin I: 0.68 ng/mL (ref 0.00–0.06)
Troponin I: 0.74 ng/mL (ref 0.00–0.06)

## 2010-06-17 LAB — BASIC METABOLIC PANEL
BUN: 24 mg/dL — ABNORMAL HIGH (ref 6–23)
CO2: 24 mEq/L (ref 19–32)
Calcium: 8.7 mg/dL (ref 8.4–10.5)
Calcium: 8.9 mg/dL (ref 8.4–10.5)
Chloride: 108 mEq/L (ref 96–112)
Creatinine, Ser: 1.31 mg/dL — ABNORMAL HIGH (ref 0.4–1.2)
Glucose, Bld: 114 mg/dL — ABNORMAL HIGH (ref 70–99)
Glucose, Bld: 122 mg/dL — ABNORMAL HIGH (ref 70–99)
Potassium: 4.3 mEq/L (ref 3.5–5.1)

## 2010-06-17 LAB — URINALYSIS, MICROSCOPIC ONLY
Nitrite: NEGATIVE
Protein, ur: NEGATIVE mg/dL
Specific Gravity, Urine: 1.01 (ref 1.005–1.030)
Urobilinogen, UA: 1 mg/dL (ref 0.0–1.0)

## 2010-06-17 LAB — GLUCOSE, CAPILLARY
Glucose-Capillary: 110 mg/dL — ABNORMAL HIGH (ref 70–99)
Glucose-Capillary: 117 mg/dL — ABNORMAL HIGH (ref 70–99)
Glucose-Capillary: 119 mg/dL — ABNORMAL HIGH (ref 70–99)
Glucose-Capillary: 124 mg/dL — ABNORMAL HIGH (ref 70–99)
Glucose-Capillary: 159 mg/dL — ABNORMAL HIGH (ref 70–99)
Glucose-Capillary: 177 mg/dL — ABNORMAL HIGH (ref 70–99)

## 2010-06-17 LAB — CK TOTAL AND CKMB (NOT AT ARMC)
CK, MB: 6.1 ng/mL — ABNORMAL HIGH (ref 0.3–4.0)
Total CK: 162 U/L (ref 7–177)

## 2010-06-17 LAB — DIFFERENTIAL
Basophils Absolute: 0.1 10*3/uL (ref 0.0–0.1)
Basophils Relative: 1 % (ref 0–1)
Eosinophils Absolute: 0.2 10*3/uL (ref 0.0–0.7)
Neutro Abs: 5.8 10*3/uL (ref 1.7–7.7)
Neutrophils Relative %: 57 % (ref 43–77)

## 2010-06-17 LAB — URINALYSIS, ROUTINE W REFLEX MICROSCOPIC
Glucose, UA: NEGATIVE mg/dL
Ketones, ur: NEGATIVE mg/dL
Nitrite: NEGATIVE
Specific Gravity, Urine: 1.013 (ref 1.005–1.030)
pH: 6 (ref 5.0–8.0)

## 2010-06-17 LAB — LIPID PANEL
Cholesterol: 131 mg/dL (ref 0–200)
LDL Cholesterol: 70 mg/dL (ref 0–99)
Total CHOL/HDL Ratio: 3 RATIO

## 2010-06-17 LAB — HEMOGLOBIN A1C
Hgb A1c MFr Bld: 5.5 % (ref 4.6–6.1)
Mean Plasma Glucose: 111 mg/dL

## 2010-06-17 LAB — TROPONIN I: Troponin I: 0.49 ng/mL — ABNORMAL HIGH (ref 0.00–0.06)

## 2010-06-17 LAB — POCT CARDIAC MARKERS
CKMB, poc: 2.7 ng/mL (ref 1.0–8.0)
Myoglobin, poc: 203 ng/mL (ref 12–200)

## 2010-06-25 LAB — BASIC METABOLIC PANEL
BUN: 29 mg/dL — ABNORMAL HIGH (ref 6–23)
Calcium: 8.7 mg/dL (ref 8.4–10.5)
GFR calc non Af Amer: 38 mL/min — ABNORMAL LOW (ref >60)
Glucose, Bld: 76 mg/dL (ref 70–99)
Potassium: 3.8 mEq/L (ref 3.5–5.1)

## 2010-06-25 LAB — URINALYSIS, ROUTINE W REFLEX MICROSCOPIC
Bilirubin Urine: NEGATIVE
Glucose, UA: NEGATIVE mg/dL
Specific Gravity, Urine: 1.017 (ref 1.005–1.030)
Urobilinogen, UA: 1 mg/dL (ref 0.0–1.0)
pH: 6 (ref 5.0–8.0)

## 2010-06-25 LAB — DIFFERENTIAL
Basophils Absolute: 0 10*3/uL (ref 0.0–0.1)
Eosinophils Relative: 7 % — ABNORMAL HIGH (ref 0–5)
Lymphocytes Relative: 18 % (ref 12–46)

## 2010-06-25 LAB — URINE CULTURE: Colony Count: 10000

## 2010-06-25 LAB — CBC
HCT: 28.4 % — ABNORMAL LOW (ref 36.0–46.0)
Platelets: 162 10*3/uL (ref 150–400)
RDW: 13.9 % (ref 11.5–15.5)

## 2010-06-25 LAB — URINE MICROSCOPIC-ADD ON

## 2010-07-28 NOTE — Discharge Summary (Signed)
[name], [name] NO.:  [id number]   MEDICAL RECORD NO.:  [id number]          PATIENT TYPE:  INP   LOCATION:  2019                         FACILITY:  [facility]   PHYSICIAN:  Altha Harm, MDDATE OF BIRTH:  06-10-1929   DATE OF ADMISSION:  12/25/2007  DATE OF DISCHARGE:  12/29/2007                               DISCHARGE SUMMARY   ADDENDUM.   PROBLEM:  Patient with mild systolic heart failure, end diastolic  dysfunction.   The patient had been on lisinopril.  This had been stopped by a  cardiologist at [facility] upon this admission.  At  this time, I will not restart the lisinopril and defer to the  cardiologist to make that decision when the patient is seen in the  office.      Altha Harm, MD  Electronically Signed     MAM/MEDQ  D:  12/29/2007  T:  12/29/2007  Job:  [id number]

## 2010-07-28 NOTE — H&P (Signed)
[name], [name] NO.:  [id number]   MEDICAL RECORD NO.:  [id number]          PATIENT TYPE:  INP   LOCATION:  3306                         FACILITY:  [facility]   PHYSICIAN:  Della Goo, M.D. DATE OF BIRTH:  1929-06-26   DATE OF ADMISSION:  12/25/2007  DATE OF DISCHARGE:                              HISTORY & PHYSICAL   PRIMARY CARE PHYSICIAN:  [facility], Dr. Frederik [name]   CHIEF COMPLAINT:  Shortness of breath.   HISTORY OF PRESENT ILLNESS:  This is a 77 year old female who was  brought emergently to the hospital after having sudden onset of  shortness of breath at 11:00 p.m. in her home.  The patient lives with  her son and his family, and he reports hearing her struggling to breathe  and also found her to be severely confused at that time.  He reports  that she was stating that she was seeing her husband who is now  deceased.  EMS was called, and the patient was taken to the emergency  department.  The initial evaluation found the patient to be in  respiratory distress but also to be hypoglycemic with a CBG in the 50s.  The patient was administered one ampule of D50, and when she arrived in  the emergency department, the patient eventually had titration of her  oxygen up until she was placed on BiPAP therapy.  The patient was  evaluated by the EDP and found to have a COPD exacerbation.  She was  administered nebulizer treatments and also administered Solu-Medrol  therapy and had improvement and was weaned off of the BiPAP therapy.  The patient's blood sugar also had improved as well as her alertness.  The patient denied having any chest pain.  She denies having any cough  or chest congestion or nasal congestion, nausea, vomiting or diarrhea.  The patient has a history of COPD/emphysema, and she also has diastolic  congestive heart failure and cardiomyopathy with an ejection fraction of  40-45%, mild aortic stenosis, hypertension, type 2 diabetes  mellitus,  hyperlipidemia, she is status post hysterectomy, status post  tonsillectomy, and status post appendectomy.  The patient's medications  will be further verified.  The patient's son will call back with her  medications.   ALLERGIES:  PAXIL   SOCIAL HISTORY:  The patient has a past history of smoking.  She quit  one year ago.  She is a nondrinker.   FAMILY HISTORY:  Noncontributory.   REVIEW OF SYSTEMS:  Pertinents are mentioned above.   PHYSICAL EXAMINATION FINDINGS:  GENERAL:  This is an obese 77 year old  female who is currently in no acute distress.  VITAL SIGNS:  Temperature 98.6, blood pressure 160/81, heart rate 86,  respirations 30 her O2 sats initially were 99% when she was on BiPAP at  a 35% FIO2.  This was titrated downward.  The patient was down to 4  liters and had an O2 sat of 94%.  HEENT: Normocephalic, atraumatic.  Pupils equal round reactive to light.  Extraocular movements are intact.  Funduscopic benign.  Oropharynx is  clear.  NECK;  supple full range of motion.  No thyromegaly, adenopathy,  jugulovenous distention.  CARDIOVASCULAR:  Regular rate and rhythm.  Positive systolic ejection  murmur heard at the right second intercostal space.  LUNGS:  With  decreased breath sounds bilaterally and mild expiratory wheezes.  ABDOMEN:  Positive bowel sounds, soft, nontender, nondistended.  EXTREMITIES:  Without cyanosis, clubbing or edema.  NEUROLOGIC:  Examination nonfocal.   LABORATORY STUDIES:  White blood cell count 8.9, hemoglobin 13.7,  hematocrit 39.2, platelets 169, neutrophils 58%, lymphocytes 25%.  Sodium 139, potassium 3.7, chloride 111, bicarb 23, BUN 23, creatinine  1.13 and glucose 52 initially.  On recheck after one ampule of D50, the  blood sugar was 182.  Protime 14.0, INR 1.1, lipase 16.  BNP 249.0.  Arterial blood gas 7.420, pCO2 45, pO2 526, bicarb 29.3 and 100% on  BiPAP with a 35% FIO2.  Urinalysis negative.  Point of care markers:   Myoglobin of 71.7, CK-MB 1.3, troponin less than 0.05.  Chest x-ray  reveals peribronchial thickening with mild interstitial edema and right  infrahilar opacity is present.  A right infrahilar opacity which may be  consistent with atelectasis or infection/aspiration.  Also a pneumonia  level was performed and it returned elevated at 99.  CT scan of the head  performed is negative for any acute findings.  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   ASSESSMENT:  A 77 year old female being admitted with:  1. Respiratory distress.  2. COPD exacerbation.  3. Hypoglycemic episode.  4. Mild diastolic CHF.  5. Chronic renal insufficiency.  6. Elevated ammonia level.   PLAN:  The patient will be admitted to a step-down ICU area.  Cardiac  enzymes will be performed.  The patient will be placed on nebulizer  treatments, oxygen and IV steroid taper.  Her home medications will be  further verified.  Her son is to call back with her medications and  their dosages.  The patient will be placed on DVT and GI prophylaxis and  sliding-scale insulin coverage has also been ordered.  The hypoglycemic  protocol will also be followed as needed.  A repeat ammonia level will  be performed in the a.m. and further workup will ensue pending the  patient's clinical course and results of her studies.      Della Goo, M.D.  Electronically Signed     HJ/MEDQ  D:  12/25/2007  T:  12/25/2007  Job:  [id number]   cc:   Lenon Curt. Chilton Si, M.D.

## 2010-07-28 NOTE — Consult Note (Signed)
NAMEJAHMIA, [name] NO.:  [id number]   MEDICAL RECORD NO.:  [id number]          PATIENT TYPE:  INP   LOCATION:  3306                         FACILITY:  [facility]   PHYSICIAN:  Bernette Redbird, M.D.   DATE OF BIRTH:  [birth year]   DATE OF CONSULTATION:  12/25/2007  DATE OF DISCHARGE:                                 CONSULTATION   We were asked to see [name] today in consultation for a possible GI  bleed.   HISTORY OF PRESENT ILLNESS:  This is a 77 year old female with a recent  history of heme-positive anemia.  She was admitted in respiratory  distress with a COPD exacerbation and hypoglycemic with a CBG in the  104s.  She has recently had an upper endoscopy and colonoscopy by Dr.  Dorena Cookey 1 month ago.  The colonoscopy was incomplete secondary to  poor prep.  A barium enema was recommended to complete the study.  The  patient describes abdominal pain particularly on her left side, usually  after eating, lasts about an hour.  She says it is not relieved by  famotidine or Tums.  The patient has been having soft stools on MiraLax.  She says that if she does not take her MiraLax, it will get hard and she  has recently discontinued her iron in order to test her stools for  guaiac positivity.   PAST MEDICAL HISTORY:  Significant for:  1. COPD.  2. Diastolic congestive heart failure.  3. Cardiomyopathy with an ejection fraction of 40-45%.  4. Mild aortic stenosis.  5. Hypertension.  6. Type 2 diabetes.  7. Hyperlipidemia.  8. Chronic anemia.  9. Chronic pain.  10.History of depression.  11.Nonmobile cardiac thrombus.  12.She is status post hysterectomy, tonsillectomy, and appendectomy.   CURRENT MEDICATIONS:  Lantus, metoprolol, furosemide, bupropion, calcium  with vitamin D, magnesium, Colace, lisinopril, lovastatin, hydrocodone,  famotidine and albuterol.   ALLERGIES:  She has allergies to PAROXETINE and TRAMADOL.   REVIEW OF SYSTEMS:  Positive for  weight loss approximately 10 pounds in  the last 2 months as well as per HPI.   SOCIAL HISTORY:  She has a 90+ pack year tobacco history, but does not  smoke any longer.   FAMILY HISTORY:  She tells me that she believes her mother had colon  cancer as she was colonoscoped shortly before her death and had a mass  that they decided not to do surgery on.   PHYSICAL EXAMINATION:  GENERAL:  She is alert and oriented, on nasal  cannula O2.  HEART:  Regular rate and rhythm with murmur.  LUNGS:  Clear to auscultation anteriorly.  ABDOMEN:  Obese, soft, and slightly tender in left lower quadrant.  RECTAL:  She is nontender with no stool in her rectal vault.  Her fluid  is faintly guaiac-positive.   LABORATORY DATA:  Troponin of 0.14.  Coags are normal.  Hemoglobin is  13.7, hematocrit 39.2, white count is 8.9, and platelets 169,000.  Her  BMET significant for a BUN of 23, creatinine 1.13, and glucose of 52.  LFTs are normal.  She  is going for an abdominal ultrasound this  afternoon.   ASSESSMENT AND PLAN:  Dr. Molly Maduro Buccini has seen and examined the  patient, collected a history, and reviewed her chart.  His impression is  this is a 77 year old female with multiple medical problems who does  have heme-positive stool, is currently not anemic.  She did have an  incomplete colonoscopy, and we would recommend that this study be  completed with a barium enema.  She is now being admitted with  respiratory distress and recurrent hypoglycemia as well as an elevated  troponin.  I believe the patient would be okay for cardiac cath  tomorrow, recommend that her GI workup be completed when she is stable  enough to prep and withstand the procedure.  Thanks very much for this  consultation.      Stephani Police, PA    ______________________________  Bernette Redbird, M.D.    MLY/MEDQ  D:  12/25/2007  T:  12/26/2007  Job:  [id number]   cc:   Lenon Curt. Chilton Si, M.D.  John C. Madilyn Fireman, M.D.

## 2010-07-28 NOTE — Discharge Summary (Signed)
NAMENAJAE, FILSAIME NO.:  [id number]   MEDICAL RECORD NO.:  [id number]          PATIENT TYPE:  INP   LOCATION:  2019                         FACILITY:  [facility]   PHYSICIAN:  Altha Harm, MDDATE OF BIRTH:  Nov 29, 1929   DATE OF ADMISSION:  12/25/2007  DATE OF DISCHARGE:  12/30/2007                               DISCHARGE SUMMARY   FINAL DISCHARGE DIAGNOSIS:  1. Shortness of breath.  2. Exacerbation of chronic obstructive pulmonary disease.  3. Pneumonia.  4. Hyperlipidemia.  5. Diabetes type 2.  6. Hypertension.  7. Mild systolic congestive heart failure.  8. Left bundle branch block  9. Diastolic dysfunction.   MEDICATIONS AT DISCHARGE:  1. Metoprolol 25 mg p.o. b.i.d.  2. Lasix 20 mg p.o. b.i.d.  3. Bupropion 15 mg p.o. b.i.d.  4. Calcium with vitamin D 1 tablet p.o. daily.  5. Colace 1 mg p.o. daily.  6. Lovastatin 40 mg p.o. q.h.s.  7. Hydrocodone 500 mg p.o. q. 6 h. p.r.n.  8. Famotidine 20 mg p.o. b.i.d.  9. Albuterol 2 puffs p.o. q.4-6 h. p.r.n.  10.Spiriva 18 mcg 1 puff HandiHaler p.o. daily.  11.Zantac 1 tablet p.o. q.h.s.  12.Lantus 15 units subcu at breakfast and at bedtime.   CONSULTANTS:  1. Dr. Matthias Hughs, gastroenterology.  2. [facility].   PROCEDURE:  Cardiac cath with essentially normal coronaries.   DIAGNOSTIC STUDIES:  1. Barium enema that showed tortuosity of the colon.  No colonic mass      or active process was identified at the colon.  2. Abdominal x-ray one view which shows retained stool throughout the      colon.  3. Chest x-ray two view shows resolution of bilateral airspace      process.  4. Ultrasound of the abdomen which shows poorly visualized pancreas,      abdominal aorta and inferior vena cava due to overlying bowel gas.      Otherwise unremarkable examination.  5. CT of the head without contrast which shows no acute intracranial      abnormality.   CODE STATUS:  Full  code.   ALLERGIES:  1. PAXIL.  2. Janean Sark.   PRIMARY CARE PHYSICIAN:  Dr. Murray Hodgkins, [facility].   CHIEF COMPLAINT:  Shortness of breath.   HISTORY OF PRESENT ILLNESS:  Please see the H and P dictated by Dr.  Della Goo on December 25, 2007 for details of the HPI.   However, in short this is a patient who presented with hypoglycemia and  shortness of breath.   HOSPITAL COURSE:  1. Hypoglycemia. The patient is a known diabetic who takes Lantus 34      units subcu in the morning and the evening.  It is unclear as to      whether the patient had been eating properly, however, became      hypoglycemic at night with confusion.  The patient was brought to      the hospital where the hypoglycemia was treated with dextrose      supplementation and the patient was observed.  The patient did      regain her euglycemic state and the Lantus has been decreased to 15      units subcu q. a.m. and p.m.  It needs further titration as an      outpatient if the patient resumes her normal eating patterns.  2. The patient does complain of chest pain when she arrived to the      hospital and she had serial enzymes done with a small rise in her      troponins.  The patient had intermittent left bundle branch block,      and based upon this and the patient's presenting symptoms      cardiology was consulted who assessed that it is necessary for the      patient to have a cardiac catheterization done.  Cardiac cath was      done on today which showed the patient had an essentially normal      coronary arteries.  3. Acute exacerbation of COPD. Upon arrival the patient did have      significant shortness of breath requiring nebulized beta agonist      and BiPAP treatment.  The patient was started on Solu-Medrol and      tapered down off the prednisone.  The patient is now off of her      steroids and is back to using her Spiriva and albuterol without any      difficulty.  The patient at  this time does not require any oxygen      supplementation.  4. Pneumonia.  The patient upon arrival to the emergency room had a      chest x-ray done and was assessed by the admitting physician, at      that time the patient had pneumonia.  The patient was started on      antibiotics and has completed her course of antibiotics for      treatment of pneumonia.  However, clinically it is unclear that the      patient did have pneumonia.  However, given the gravity of her      respiratory situation upon arrival it was prudent to start her      antibiotics.   DIETARY RESTRICTIONS:  The patient should be on a diabetic heart-healthy  diet.   PHYSICAL RESTRICTIONS:  No restriction to activity.   FOLLOWUP:  The patient is to follow up with Dr. Murray Hodgkins in the  office within a week or as needed.      Altha Harm, MD  Electronically Signed     MAM/MEDQ  D:  12/29/2007  T:  12/29/2007  Job:  [id number]   cc:   Lenon Curt. Chilton Si, M.D.

## 2010-07-28 NOTE — Cardiovascular Report (Signed)
[name], [name]              ACCOUNT NO.:  [id number]   MEDICAL RECORD NO.:  [id number]          PATIENT TYPE:  INP   LOCATION:  2019                         FACILITY:  [facility]   PHYSICIAN:  Cristy Hilts. Jacinto Halim, MD       DATE OF BIRTH:  06-Jan-1930   DATE OF PROCEDURE:  12/29/2007  DATE OF DISCHARGE:                            CARDIAC CATHETERIZATION   PROCEDURES PERFORMED:  1. Left ventriculography.  2. Selective right and left coronary angiography.  3. Right heart catheterization and calculation of cardiac output and      cardiac index by Fick.   INDICATION:  [name], who is a 77 year old female, who was  admitted with acute onset of shortness of breath and respiratory  failure.  She had previously undergone echocardiographic evaluation,  which had suggested mild aortic stenosis.  With the sudden onset of  shortness of breath with the patient with multiple cardiovascular risks  that includes hyperlipidemia and abnormal EKG with left bundle-branch  block and congestive heart failure by chest x-ray and diabetes mellitus,  she was felt that she should have a cardiac catheterization to evaluate  her coronary anatomy.  Right heart catheterization was performed to  evaluate for pulmonary hypertension.   HEMODYNAMIC DATA RIGHT HEART CATHETERIZATION:  RA pressure 11/9, mean 8  mmHg.   RV pressure 35/8, end-diastolic pressure 11 mmHg.   PA pressure 36/19 with a mean of 28 mmHg.  Saturation 62%.   Pulmonary capillary wedge 22/31, mean 23 mmHg.  Saturation aortic 93%.   Fick                                            Thermodilution  Cardiac output (CO) 5.98                          Cardiac output (CO)  5.71  Cardiac index (CI) 3.01                               Cardiac index (CI)  2.81  Which were normal.   LEFT HEART CATHETERIZATION HEMODYNAMIC DATA:  The left ventricular  pressure was 134/14 with end-diastolic pressure of 22 mmHg.  Aortic  pressure 132/60 with a mean of  80 mmHg.  There was no significant  pressure gradient across the aortic valve.   Right coronary artery.  Right coronary artery is a moderate-to-large  caliber vessel, which is smooth and normal.  It is a dominant vessel.   Left main coronary artery.  Left main coronary artery is a large vessel  that is smooth and normal.   Circumflex coronary artery.  Circumflex coronary artery are shows mild  calcification.  There is a 10% stenosis in the distal circumflex.  Circumflex gives origin to large obtuse marginal one.   LAD.  LAD is a large caliber vessel.  Gives origin to moderate-sized  diagonals.  There was again  minimal calcification was noted in the LAD.    Left ventricle.  Left ventricular systolic function was minimally  depressed with ejection fraction of 45-50% with mild global hypokinesis.  There was no evidence of apical thrombus.   IMPRESSION:  1. Essentially normal coronary arteries.  There is minimal      calcification noted in the coronary arteries.  No evidence of      aortic stenosis by hemodynamic measurements.  There is borderline      decrease in left ventricular systolic function.  2. Mild pulmonary hypertension with preserved cardiac output and      cardiac index.  No evidence of arteriovenous shunting.   RECOMMENDATION:  Evaluation for noncardiac causes of shortness of breath  is indicated.   TECHNIQUE OF THE PROCEDURE:  Under usual sterile precautions using a 7-  [place] right femoral venous and a 6-French right femoral arterial  access, a balloon-tip Swan-Ganz catheter was advanced into the pulmonary  artery and pulmonary capillary wedge was easily obtained.  Right-sided  hemodynamics were carefully performed and the data was carefully  analyzed.  Catheter was then pulled out of the body.   Left heart catheterization was performed using a 6-French multipurpose  B2 catheter, which was advanced into the ascending aorta and then into  the left ventricle.  Left  ventriculography was performed both in LAO and  RAO projection.  Catheter pulled into the ascending aorta.  Right  coronary was selectively engaged and angiography was performed, then  left main coronary artery was selectively engaged and angiography was  performed, then catheter was pulled out of the body.  A total of 90 mL  of contrast was utilized for diagnostic angiography.  No immediate  complications were noted.      Cristy Hilts. Jacinto Halim, MD  Electronically Signed     JRG/MEDQ  D:  12/29/2007  T:  12/30/2007  Job:  [id number]   cc:   Nanetta Batty, M.D.  Bertram Millard. Hyacinth Meeker, M.D.

## 2010-07-31 NOTE — Discharge Summary (Signed)
[name], [name]              ACCOUNT NO.:  [id number]   MEDICAL RECORD NO.:  [id number]          PATIENT TYPE:  INP   LOCATION:  2021                         FACILITY:  [facility]   PHYSICIAN:  Antonieta Iba, MD   DATE OF BIRTH:  03/06/1930   DATE OF ADMISSION:  10/27/2007  DATE OF DISCHARGE:  10/29/2007                               DISCHARGE SUMMARY   DISCHARGE DIAGNOSES:  1. Dyspnea on exertion, multifactorial, secondary to mild diastolic      congestive heart failure, acute with chronic anemia, and chronic      obstructive pulmonary disease.  2. Acute on chronic anemia:  Blood work showed that she did have low      iron.  She also had a positive guaiac stool.  She was transfused      with 2 units of packed red blood cells with hematocrit rising from      25-30.  3. Cardiomyopathy with an ejection fraction of 40-45%.  No known      coronary artery disease.  She has never had a catheterization or a      Myoview that we know of.  She did have a moderate diffuse left      ventricular hypokinesis per echocardiogram done on this admission.  4. Mild aortic stenosis by 2-D echocardiogram.  5. Non-mobile thrombus along the apical wall, questionable by      echocardiogram.  It may represent a prominent papillary muscle      secondary to her global hypokinesis and left ventricular      hypertrophy.  6. Hypertension.  7. Non-insulin-dependent diabetes mellitus.  8. Chronic obstructive pulmonary disease.  9. History of depression.  10.History of chronic pain.  11.History of hyperlipidemia.   LABORATORY:  Initial CBC showed a hemoglobin 8.5, hematocrit 25.1, WBCs  4.9, and platelets 149.  On October 28, 2007, hemoglobin 8.2, hematocrit  24.6, platelets 127, and WBCs 5.5.  After transfusion; hemoglobin 10,  hematocrit 29.9, WBCs 4.6, and platelets 133.  BNP on admission was 293;  at discharge, it was 135.  Magnesium 2.2.  CK-MBs and troponins negative  x3.  Ferritin 20, total iron  25,TIBC 308, 10%, fat 8.  Positive guaiac  stool.  Chest x-ray on October 27, 2007, mild cardiomegaly without  definite failure and bibasilar airspace disease, right greater than  left.  On October 27, 2007, echocardiogram read by Dr. Yates Decamp showing  an EF of 40-45%, LVH, and LV hypokinesis, possibility of small minimally  calcified non-mobile thrombus along the apical wall of the left  ventricle, mild AS with a mean gradient of 11, estimated aortic valve  area is 1.48 cm2, mild MAC and mild MR.  She had RVH and LAE.   DISCHARGE MEDICATIONS:  1. Bupropion 75 mg twice a day.  2. Metoprolol 50 mg b.i.d.  3. Lasix 20 mg b.i.d.  4. Lovastatin 40 mg every day.  5. Lisinopril 40 mg every day.  6. Aspirin 81 mg every day.  7. Stool softener 1 a day.  8. Pepcid 20 mg twice a day.  9. Vicodin 500 mg  four times a day.  10.Multivitamin with calcium once a day.  11.Lantus 32 units twice daily.  12.Spiriva MDI once in the morning and once at bedtime.  13.Albuterol p.r.n.  14.Ferrous sulfate 325 mg twice per day.   She will follow up in our office with a Persantine Myoview, then follow  up with cardiologist at [facility].  She  should follow up with a Gastrointestinal MD through her primary care  doctor, Dr. Murray Hodgkins.   HOSPITAL COURSE:  [name] is a 77 year old female with history of  hypertension, hyperlipidemia, and long history of smoking, who was  brought to the emergency room by her family because of shortness of  breath, only able to sleep for about 2 hours secondary to her shortness  of breath.  She denied any dark stools or hematochezia.  She was seen by  Dr. Julien Nordmann, admitted, on IV heparin, and ruled out for an MI.  She underwent 2-D echocardiogram.  Her initial hemoglobin and hematocrit  were low the following day, October 28, 2007.  It dropped from hematocrit  of 25 down to 24, thus Dr. Julien Nordmann decided that she needs to be   transfused.  She was given 2 units of packed red blood cells with 20 mg  of Lasix IV between the units.  On the following day, October 29, 2007,  her hematocrit was 30 and hemoglobin 10.  She was again seen by Dr.  Julien Nordmann.  She did have a positive guaiac stool, but he felt that  she could follow up as an outpatient with a GI doctor through her  primary care doctor.  Also, he felt that she could have an outpatient  ischemic evaluation and that for now aspirin would be held, which she  was on in the hospital, but apparently not as an outpatient per her  medication reconciliation form.       Lezlie Octave, N.P.      Antonieta Iba, MD  Electronically Signed    BB/MEDQ  D:  12/07/2007  T:  12/08/2007  Job:  [id number]   cc:   Lenon Curt. Chilton Si, M.D.

## 2010-07-31 NOTE — Discharge Summary (Signed)
[name], [name]              ACCOUNT NO.:  [id number]   MEDICAL RECORD NO.:  [id number]          PATIENT TYPE:  INP   LOCATION:  3732                         FACILITY:  [facility]   PHYSICIAN:  [name], MDDATE OF BIRTH:  25-Jan-1930   DATE OF ADMISSION:  06/26/2006  DATE OF DISCHARGE:  06/30/2006                               DISCHARGE SUMMARY   DISCHARGE DISPOSITION:  Home.   FINAL DISCHARGE DIAGNOSES:  1. Hypoxemia, resolved.  2. Staph aureus urinary tract infection.  3. Possible pneumonia.  4. Mild decompensative congestive heart failure, resolved.  5. Mild hypokalemia.  6. History of hypertension.  7. Diabetes type 2.  8. Chronic pain.  9. History of depression.   DISCHARGE MEDICATIONS:  New medications:  1. Bactrim DS 1 tab p.o. b.i.d. x7 days.  2. Potassium 20 mEq daily.   Continued medications:  1. Lopressor 50 mg p.o. b.i.d.  2. Lasix 40 mg p.o. daily.  3. Lisinopril 40 mg p.o. daily.  4. Wellbutrin 150 mg p.o. b.i.d.  5. Lantus 34 units subcutaneously 2 times a day.  6. Lovastatin 40 mg p.o. daily at bedtime.  7. Vicodin 1 tab p.o. q.4 hours p.r.n.   CONSULTANTS:  Dr. Ninetta [name], infectious diseases.   PROCEDURES:  None.   DIAGNOSTIC STUDIES:  1. A 2D echocardiogram, which shows overall left ventricular systolic      function mildly decreased with a left ventricular ejection fraction      estimated between 45-50%.  There is mild diffuse left ventricular      hypokinesis and left ventricular diastolic function parameters are      normal.  2. CT with pulmonary angiogram, which shows no evidence of pulmonary      embolus.  Minimal bronchitic and interstitial changes with minimal      right lower lobe infiltrate.  3. Peribronchial thickening, likely related to chronic bronchitis or      smoking.  Irregularity of right-sided ribs, likely remote trauma.   PERTINENT LABORATORY STUDIES:  At time of discharge, patient had a white  blood cell count of  6.9, a hemoglobin of 10.7, hematocrit of 31.6, a  platelet count of 151.  A sodium of 138, a potassium of 4.4, chloride of  106, bicarb of 23, a BUN of 24 and a creatinine of 1.0 and a calcium of  8.1.  Patient's hemoglobin A1c, on this hospitalization, was 5.9.  Cholesterol was checked.  Patient's total cholesterol was 164,  triglycerides 113, HDL 61, LDL 80.  Culture:  Patient had a urine  culture, which was positive for staph aureus, which was sensitive to  Levaquin and Bactrim and vancomycin.  Blood cultures were sterile and  revealed no staph aureus.   CODE STATUS:  Full code.   ALLERGIES:  TO PAXIL.   PRIMARY CARE PHYSICIAN:  Dr. Murray [name].   CHIEF COMPLAINT:  Shortness of breath.   HISTORY OF PRESENT ILLNESS:  Please see H&P dictated by Dr. Darene [name] for  details of the HPI.  However, briefly, the patient is a 77 year old with  diabetes, CHF, COPD, hypertension, hyperlipidemia who presented with  worsening shortness of breath over a period of 3-4 days.   HOSPITAL COURSE:  1. Right lower lobe pneumonia.  The patient was started on vancomycin      and Zosyn for her pneumonia.  The patient was initially hypoxemic      and the hypoxemia resolved over the course of her hospital stay.      At this time, the patient has normal O2 saturations on room air and      is able to ambulate without any difficulty.  2. Staph aureus urinary tract infection.  The patient was found to      have a staph aureus UTI, which was sensitive to several      medications.  The patient was initially started on vancomycin and      this was transitioned over to Bactrim.  The patient will continue      on Bactrim for a total of 7 days.  3. Hypokalemia.  The patient was mildly hypokalemic with a potassium      at a nadir of 3.1.  The patient was orally replaced and is now in a      eukalemic state.  The patient will continue on potassium      supplementation in light of the fact that she is on chronic  Lasix.  4. Congestive heart failure.  The patient does have mild systolic      dysfunction and was initially, clinically appeared to be in a      decompensated state of CHF.  The patient was diuresed.  Results are      clinical state and is being continued off of Prinivil and      Lopressor, which she had been on previously.  The patient,      otherwise, remained stable during her hospital stay, with blood      pressures in a normotensive state and her blood sugars in a      euglycemic state.   FOLLOWUP:  The patient is to follow up with primary care physician in 1  week or p.r.n.   DIETARY RESTRICTIONS:  The patient should be on a low-cholesterol, low-  sodium, diabetic diet.   PHYSICAL RESTRICTIONS:  None.   FOLLOWUP LABORATORY STUDIES:  No compelling laboratory studies need to  be done at this time; however, I would recommend that the patient does  have an evaluation of her electrolytes sometime in the next 3-6 months  to evaluate for her potassium levels.      Yvonne Harm, MD  Electronically Signed     MAM/MEDQ  D:  06/30/2006  T:  06/30/2006  Job:  [id number]   cc:   [name], M.D.

## 2010-07-31 NOTE — H&P (Signed)
[name], [name]              ACCOUNT NO.:  [id number]   MEDICAL RECORD NO.:  [id number]          PATIENT TYPE:  EMS   LOCATION:  [facility]                         FACILITY:  [facility]   PHYSICIAN:  Madaline Savage, MD        DATE OF BIRTH:  Jul 05, 1929   DATE OF PROCEDURE:  DATE OF DISCHARGE:                    STAT - MUST CHANGE TO CORRECT WORK TYPE   PRIMARY CARE PHYSICIAN:  Lenon Curt. Chilton Si, M.D.   CHIEF COMPLAINT:  Shortness of breath and nausea.   [name] is a 77 year old lady with a history of diabetes mellitus,  CHF, history of COPD, hypertension and hyperlipidemia, who comes in with  worsening shortness of breath for the last 3 to 4 days.  She says she  has had a similar attack approximately 10 years ago, when she was  diagnosed to have CHF.  She says her breathing difficulty started three  days ago and progressively got worse.  She also complains of nausea.  She has not vomited.  She also feels generally weak for the last two to  three days.  She says she has gained approximately 10 pounds in the last  one week.  She usually weighs approximately over 210 to 220 pounds, but  this morning she was 230 pounds.  She denies having any chest pain.  She  did feel that she had chills, but she denies having any fever.  She  denies having any cough at this time.  She says she had cough to start  with, with whitish sputum production.  She started taking Robitussin,  and her cough has improved since then.  She denies chest pain or any  abdominal pain or any change in her bowel habits.  No other complaints.   PAST MEDICAL HISTORY:  1. History of CHF, she says for the last 10 to 14 years, but she says      she has never had an echocardiogram of a heart cath.  2. History of COPD.  She states her last lung function studies were      approximately 6 years ago, and she was told she was not that bad at      that time.  3. Diabetes mellitus.  She says her blood sugars are well controlled,  usually running between 100 and 150.  4. Hypertension.  5. Hyperlipidemia.   PAST SURGICAL HISTORY:  1. Appendectomy.  2. Hysterectomy.  3. Tonsillectomy.   ALLERGIES:  SHE IS ALLERGIC TO PAXIL.   SOCIAL HISTORY:  She lives with her son and daughter-in-law.  She has a  family history of smoking in the past, smoked since the age of 67 and  has smoked since then.  She now smokes approximately 3 cigarettes a day,  but she used to be a heavy smoker in the past.  She takes alcohol very  occasionally.  No history of drug abuse.   FAMILY HISTORY:  Noncontributory.   REVIEW OF SYSTEMS:  GENERAL:  She denies any fever but does complain of  chills.  No headaches, no blurred vision, no sore throat.  CARDIOVASCULAR:  Denies chest pain, denies palpitation.  RESPIRATORY:  She does have shortness of breath, no cough.  GI:  No abdominal pain but does complain of nausea.  No change in bowel  habits.   PHYSICAL EXAMINATION:  GENERAL:  She is alert and oriented x3.  VITAL SIGNS:  Temperature is 100, pulse rate of 119, regular.  Blood  pressure 155/72, oxygen saturation 97% on 2 liters nasal cannula.  HEENT:  Head atraumatic, normocephalic.  Pupils bilaterally equal and  reactive to light.  Moist mucous membranes.  NECK:  Supple, no JVD, no carotid bruits.  CARDIOVASCULAR:  S1, S2, regular rate and rhythm.  CHEST:  Bilateral wheezes heard.  No crackles heard.  ABDOMEN:  Soft, nontender.  EXTREMITIES:  No edema appreciated.   LABORATORY:  She had a white count of 409, hemoglobin of 11.6, platelets  128, sodium 134, potassium 3.5, chloride 103, bicarb 23, BUN 8,  creatinine 0.86, glucose 193.  BNP is elevated at 337.  She had a  urinalysis which showed 21-50 WBCs and many bacteria.  Her x-ray chest  showed no acute cardiopulmonary process but pre-bronchial thickening,  likely chronic bronchitis.  She had an EKG which showed normal sinus  rhythm.   IMPRESSION:  1. Dyspnea, which is likely  multifactorial.  2. CHF with decompensation.  3. Acute bronchitis versus pneumonia.  4. Urinary tract infection.  5. Chronic obstructive pulmonary disease.  6. Diabetes mellitus.  7. Hypertension.  8. Hyperlipidemia.  9. Tobaccoism.   PLAN:  1. Dyspnea.  this is a 77 year old lady who comes in with shortness of      breath which has been worsening for the last three days.  She does      have a fever of 100, and she does have a mildly elevated BN PT of      337.  Clinically, she does not appear to be in overt heart failure,      but as per the family, she has been gaining weight and has gained      approximately 10 pounds.  We will dose her Lasix as IV at this      point of time, and we will adjust the dose of Lasix, but looking at      the clinical response.  She also has a very big history of smoking      in the past, and I will also put her on oxygen and aerosols.  Her x-      ray of the chest does not show an infiltrate, but she does have      wheezing and mild fever.  She does have a runny nose at this time.      She could probably have acute bronchitis.  I would start her on      Rocephin and Citrobacter at this time, and I will also get a D-      dimer on her.  If the D-dimer is positive, I will go ahead and do a      CT of her chest.  I will also send her for blood cultures and urine      cultures.  2. History of CHF.  She has a history of CHF, and her BN PT is      elevated at 337.  We will use her Lasix as IV.  I will get a 2D      echocardiogram to assess her LV function.  3. Acute bronchitis versus pneumonia.  Will put her on IV antibiotics,  obtain cultures.  4. Urinary tract infection.  She does not have any symptoms of urinary      tract infection.  Her urinalysis shows pyuria.  Will get cultures      on her.  Rocephin should provide adequate coverage for urinary      pathogens.  5. History of COPD.  I will put her on Solu-Medrol and aerosols. 6. Diabetes  mellitus.  I will restart her Lantus at her home dose.  I      will also put her on a sliding scale coverage.  I expect her blood      sugars to be elevated at this time, because of we are using      steroids.  7. Hypertension.  Blood pressure is mildly elevated, but I expect it      so come down with the use of Lasix.  8. Hyperlipidemia.  Will get a lipid profile in the morning.  9. Arthritis.  She does have a history of arthritis and uses Vicodin      at home.  I will continue the same dose of Vicodin while in the      hospital.  10.I will put her on DVT and GI prophylaxis.      Madaline Savage, MD  Electronically Signed     PKN/MEDQ  D:  06/26/2006  T:  06/26/2006  Job:  [id number]

## 2010-08-17 ENCOUNTER — Emergency Department (HOSPITAL_COMMUNITY): Payer: Medicare Other

## 2010-08-17 ENCOUNTER — Inpatient Hospital Stay (HOSPITAL_COMMUNITY)
Admission: EM | Admit: 2010-08-17 | Discharge: 2010-08-22 | Disposition: A | Discharge status: Home Health | Payer: Medicare Other | Source: Home / Self Care | Attending: Internal Medicine | Admitting: Internal Medicine

## 2010-08-17 DIAGNOSIS — J438 Other emphysema: Secondary | ICD-10-CM | POA: Diagnosis present

## 2010-08-17 DIAGNOSIS — F1910 Other psychoactive substance abuse, uncomplicated: Secondary | ICD-10-CM | POA: Diagnosis present

## 2010-08-17 DIAGNOSIS — D649 Anemia, unspecified: Secondary | ICD-10-CM | POA: Diagnosis present

## 2010-08-17 DIAGNOSIS — I509 Heart failure, unspecified: Secondary | ICD-10-CM | POA: Diagnosis present

## 2010-08-17 DIAGNOSIS — M129 Arthropathy, unspecified: Secondary | ICD-10-CM | POA: Diagnosis present

## 2010-08-17 DIAGNOSIS — I447 Left bundle-branch block, unspecified: Secondary | ICD-10-CM | POA: Diagnosis present

## 2010-08-17 DIAGNOSIS — Z87891 Personal history of nicotine dependence: Secondary | ICD-10-CM

## 2010-08-17 DIAGNOSIS — I359 Nonrheumatic aortic valve disorder, unspecified: Secondary | ICD-10-CM | POA: Diagnosis present

## 2010-08-17 DIAGNOSIS — R5381 Other malaise: Secondary | ICD-10-CM | POA: Diagnosis present

## 2010-08-17 DIAGNOSIS — E119 Type 2 diabetes mellitus without complications: Secondary | ICD-10-CM | POA: Diagnosis present

## 2010-08-17 DIAGNOSIS — Z7901 Long term (current) use of anticoagulants: Secondary | ICD-10-CM

## 2010-08-17 DIAGNOSIS — E785 Hyperlipidemia, unspecified: Secondary | ICD-10-CM | POA: Diagnosis present

## 2010-08-17 DIAGNOSIS — J449 Chronic obstructive pulmonary disease, unspecified: Secondary | ICD-10-CM | POA: Diagnosis present

## 2010-08-17 DIAGNOSIS — M549 Dorsalgia, unspecified: Secondary | ICD-10-CM | POA: Diagnosis present

## 2010-08-17 DIAGNOSIS — I5043 Acute on chronic combined systolic (congestive) and diastolic (congestive) heart failure: Principal | ICD-10-CM | POA: Diagnosis present

## 2010-08-17 DIAGNOSIS — J90 Pleural effusion, not elsewhere classified: Secondary | ICD-10-CM | POA: Diagnosis not present

## 2010-08-17 DIAGNOSIS — Z66 Do not resuscitate: Secondary | ICD-10-CM | POA: Diagnosis present

## 2010-08-17 DIAGNOSIS — I10 Essential (primary) hypertension: Secondary | ICD-10-CM | POA: Diagnosis present

## 2010-08-17 DIAGNOSIS — I428 Other cardiomyopathies: Secondary | ICD-10-CM | POA: Diagnosis present

## 2010-08-17 DIAGNOSIS — J9600 Acute respiratory failure, unspecified whether with hypoxia or hypercapnia: Secondary | ICD-10-CM | POA: Diagnosis present

## 2010-08-17 DIAGNOSIS — J4489 Other specified chronic obstructive pulmonary disease: Secondary | ICD-10-CM | POA: Diagnosis present

## 2010-08-17 DIAGNOSIS — R5383 Other fatigue: Secondary | ICD-10-CM | POA: Diagnosis present

## 2010-08-17 DIAGNOSIS — Z765 Malingerer [conscious simulation]: Secondary | ICD-10-CM

## 2010-08-17 DIAGNOSIS — D696 Thrombocytopenia, unspecified: Secondary | ICD-10-CM | POA: Diagnosis present

## 2010-08-17 DIAGNOSIS — I4891 Unspecified atrial fibrillation: Secondary | ICD-10-CM | POA: Diagnosis present

## 2010-08-17 DIAGNOSIS — D638 Anemia in other chronic diseases classified elsewhere: Secondary | ICD-10-CM | POA: Diagnosis present

## 2010-08-17 DIAGNOSIS — I2789 Other specified pulmonary heart diseases: Secondary | ICD-10-CM | POA: Diagnosis present

## 2010-08-17 DIAGNOSIS — I5023 Acute on chronic systolic (congestive) heart failure: Secondary | ICD-10-CM | POA: Diagnosis present

## 2010-08-17 DIAGNOSIS — R040 Epistaxis: Secondary | ICD-10-CM | POA: Diagnosis present

## 2010-08-17 DIAGNOSIS — M1990 Unspecified osteoarthritis, unspecified site: Secondary | ICD-10-CM | POA: Diagnosis present

## 2010-08-17 DIAGNOSIS — Z794 Long term (current) use of insulin: Secondary | ICD-10-CM

## 2010-08-17 LAB — GLUCOSE, CAPILLARY: Glucose-Capillary: 195 mg/dL — ABNORMAL HIGH (ref 70–99)

## 2010-08-17 LAB — POCT I-STAT, CHEM 8
BUN: 35 mg/dL — ABNORMAL HIGH (ref 6–23)
Chloride: 107 mEq/L (ref 96–112)
Potassium: 3.7 mEq/L (ref 3.5–5.1)
Sodium: 141 mEq/L (ref 135–145)

## 2010-08-17 LAB — URINALYSIS, ROUTINE W REFLEX MICROSCOPIC
Bilirubin Urine: NEGATIVE
Glucose, UA: NEGATIVE mg/dL
Hgb urine dipstick: NEGATIVE
Specific Gravity, Urine: 1.013 (ref 1.005–1.030)
Urobilinogen, UA: 0.2 mg/dL (ref 0.0–1.0)

## 2010-08-17 LAB — CARDIAC PANEL(CRET KIN+CKTOT+MB+TROPI)
Relative Index: INVALID (ref 0.0–2.5)
Total CK: 70 U/L (ref 7–177)
Troponin I: 0.34 ng/mL (ref <0.30)

## 2010-08-17 LAB — POCT I-STAT 3, VENOUS BLOOD GAS (G3P V)
Bicarbonate: 23.2 mEq/L (ref 20.0–24.0)
TCO2: 24 mmol/L (ref 0–100)
pCO2, Ven: 39.2 mmHg — ABNORMAL LOW (ref 45.0–50.0)
pH, Ven: 7.38 — ABNORMAL HIGH (ref 7.250–7.300)

## 2010-08-17 LAB — HEMOGLOBIN AND HEMATOCRIT, BLOOD
HCT: 28 % — ABNORMAL LOW (ref 36.0–46.0)
Hemoglobin: 9.8 g/dL — ABNORMAL LOW (ref 12.0–15.0)

## 2010-08-17 LAB — CBC
Platelets: 106 10*3/uL — ABNORMAL LOW (ref 150–400)
RBC: 2.62 MIL/uL — ABNORMAL LOW (ref 3.87–5.11)
WBC: 5.7 10*3/uL (ref 4.0–10.5)

## 2010-08-17 LAB — DIFFERENTIAL
Basophils Absolute: 0.1 10*3/uL (ref 0.0–0.1)
Basophils Relative: 1 % (ref 0–1)
Eosinophils Absolute: 0.3 10*3/uL (ref 0.0–0.7)
Lymphs Abs: 0.8 10*3/uL (ref 0.7–4.0)
Neutrophils Relative %: 66 % (ref 43–77)

## 2010-08-17 LAB — PROTIME-INR
INR: 1.73 — ABNORMAL HIGH (ref 0.00–1.49)
Prothrombin Time: 20.4 seconds — ABNORMAL HIGH (ref 11.6–15.2)

## 2010-08-17 LAB — CK TOTAL AND CKMB (NOT AT ARMC)
CK, MB: 3.1 ng/mL (ref 0.3–4.0)
Total CK: 78 U/L (ref 7–177)

## 2010-08-17 LAB — TROPONIN I: Troponin I: <0.3 ng/mL (ref <0.30)

## 2010-08-18 LAB — CROSSMATCH: Antibody Screen: NEGATIVE

## 2010-08-18 LAB — CBC
HCT: 28.8 % — ABNORMAL LOW (ref 36.0–46.0)
MCV: 92.3 fL (ref 78.0–100.0)
Platelets: 108 10*3/uL — ABNORMAL LOW (ref 150–400)
RBC: 3.12 MIL/uL — ABNORMAL LOW (ref 3.87–5.11)
WBC: 5.4 10*3/uL (ref 4.0–10.5)

## 2010-08-18 LAB — CARDIAC PANEL(CRET KIN+CKTOT+MB+TROPI)
CK, MB: 3.3 ng/mL (ref 0.3–4.0)
Relative Index: INVALID (ref 0.0–2.5)
Total CK: 66 U/L (ref 7–177)
Troponin I: 0.36 ng/mL (ref <0.30)

## 2010-08-18 LAB — IRON AND TIBC
Iron: 51 ug/dL (ref 42–135)
Saturation Ratios: 19 % — ABNORMAL LOW (ref 20–55)

## 2010-08-18 LAB — GLUCOSE, CAPILLARY: Glucose-Capillary: 176 mg/dL — ABNORMAL HIGH (ref 70–99)

## 2010-08-18 LAB — BASIC METABOLIC PANEL
Calcium: 8.6 mg/dL (ref 8.4–10.5)
GFR calc Af Amer: >60 mL/min (ref >60)
GFR calc non Af Amer: 59 mL/min — ABNORMAL LOW (ref >60)
Glucose, Bld: 142 mg/dL — ABNORMAL HIGH (ref 70–99)
Sodium: 145 mEq/L (ref 135–145)

## 2010-08-18 LAB — PROTIME-INR: Prothrombin Time: 19.2 seconds — ABNORMAL HIGH (ref 11.6–15.2)

## 2010-08-18 LAB — VITAMIN B12: Vitamin B-12: 398 pg/mL (ref 211–911)

## 2010-08-19 ENCOUNTER — Inpatient Hospital Stay (HOSPITAL_COMMUNITY): Payer: Medicare Other

## 2010-08-19 DIAGNOSIS — R0602 Shortness of breath: Secondary | ICD-10-CM

## 2010-08-19 LAB — CBC
HCT: 31.8 % — ABNORMAL LOW (ref 36.0–46.0)
HCT: 29.9 % — ABNORMAL LOW (ref 36.0–46.0)
Hemoglobin: 10.1 g/dL — ABNORMAL LOW (ref 12.0–15.0)
MCH: 30.9 pg (ref 26.0–34.0)
MCH: 31.4 pg (ref 26.0–34.0)
MCHC: 33.3 g/dL (ref 30.0–36.0)
MCV: 92.7 fL (ref 78.0–100.0)
MCV: 92.9 fL (ref 78.0–100.0)
Platelets: 118 10*3/uL — ABNORMAL LOW (ref 150–400)
Platelets: 118 10*3/uL — ABNORMAL LOW (ref 150–400)
RBC: 3.22 MIL/uL — ABNORMAL LOW (ref 3.87–5.11)
RDW: 16.2 % — ABNORMAL HIGH (ref 11.5–15.5)
WBC: 4.9 10*3/uL (ref 4.0–10.5)

## 2010-08-19 LAB — GLUCOSE, CAPILLARY
Glucose-Capillary: 131 mg/dL — ABNORMAL HIGH (ref 70–99)
Glucose-Capillary: 123 mg/dL — ABNORMAL HIGH (ref 70–99)

## 2010-08-19 LAB — BASIC METABOLIC PANEL
CO2: 28 mEq/L (ref 19–32)
Calcium: 8.8 mg/dL (ref 8.4–10.5)
Chloride: 104 mEq/L (ref 96–112)
GFR calc Af Amer: >60 mL/min (ref >60)
Potassium: 3.6 mEq/L (ref 3.5–5.1)
Sodium: 139 mEq/L (ref 135–145)

## 2010-08-19 LAB — PROTIME-INR: INR: 1.54 — ABNORMAL HIGH (ref 0.00–1.49)

## 2010-08-19 LAB — D-DIMER, QUANTITATIVE: D-Dimer, Quant: 0.71 ug/mL-FEU — ABNORMAL HIGH (ref 0.00–0.48)

## 2010-08-19 MED ORDER — IOHEXOL 300 MG/ML  SOLN
100.0000 mL | Freq: Once | INTRAMUSCULAR | Status: AC | PRN
  Administered 2010-08-19: 100 mL via INTRAVENOUS

## 2010-08-20 LAB — GLUCOSE, CAPILLARY
Glucose-Capillary: 93 mg/dL (ref 70–99)
Glucose-Capillary: 174 mg/dL — ABNORMAL HIGH (ref 70–99)
Glucose-Capillary: 153 mg/dL — ABNORMAL HIGH (ref 70–99)

## 2010-08-20 LAB — BASIC METABOLIC PANEL
Chloride: 103 mEq/L (ref 96–112)
GFR calc non Af Amer: >60 mL/min (ref >60)
Glucose, Bld: 100 mg/dL — ABNORMAL HIGH (ref 70–99)
Potassium: 3.5 mEq/L (ref 3.5–5.1)
Sodium: 141 mEq/L (ref 135–145)

## 2010-08-21 LAB — CBC
Hemoglobin: 10.5 g/dL — ABNORMAL LOW (ref 12.0–15.0)
MCHC: 34.3 g/dL (ref 30.0–36.0)
RDW: 15.6 % — ABNORMAL HIGH (ref 11.5–15.5)
WBC: 4.5 10*3/uL (ref 4.0–10.5)

## 2010-08-21 LAB — BASIC METABOLIC PANEL
BUN: 25 mg/dL — ABNORMAL HIGH (ref 6–23)
Creatinine, Ser: 0.8 mg/dL (ref 0.4–1.2)
GFR calc Af Amer: >60 mL/min (ref >60)
GFR calc non Af Amer: >60 mL/min (ref >60)

## 2010-08-21 LAB — GLUCOSE, CAPILLARY: Glucose-Capillary: 113 mg/dL — ABNORMAL HIGH (ref 70–99)

## 2010-08-21 LAB — PROTIME-INR
INR: 2 — ABNORMAL HIGH (ref 0.00–1.49)
Prothrombin Time: 22.8 seconds — ABNORMAL HIGH (ref 11.6–15.2)

## 2010-08-22 LAB — CBC
HCT: 30.8 % — ABNORMAL LOW (ref 36.0–46.0)
Hemoglobin: 10.4 g/dL — ABNORMAL LOW (ref 12.0–15.0)
MCV: 93.3 fL (ref 78.0–100.0)
Platelets: 120 10*3/uL — ABNORMAL LOW (ref 150–400)
RBC: 3.3 MIL/uL — ABNORMAL LOW (ref 3.87–5.11)
WBC: 4.4 10*3/uL (ref 4.0–10.5)

## 2010-08-22 LAB — GLUCOSE, CAPILLARY: Glucose-Capillary: 108 mg/dL — ABNORMAL HIGH (ref 70–99)

## 2010-08-22 LAB — BASIC METABOLIC PANEL
BUN: 26 mg/dL — ABNORMAL HIGH (ref 6–23)
CO2: 28 mEq/L (ref 19–32)
Chloride: 103 mEq/L (ref 96–112)
Creatinine, Ser: 0.93 mg/dL (ref 0.4–1.2)

## 2010-08-23 ENCOUNTER — Emergency Department (HOSPITAL_COMMUNITY): Payer: Medicare Other

## 2010-08-23 ENCOUNTER — Inpatient Hospital Stay (HOSPITAL_COMMUNITY)
Admission: EM | Admit: 2010-08-23 | Discharge: 2010-08-27 | Disposition: A | Discharge status: Other Acute Inpatient Hospital | Payer: Medicare Other | Source: Home / Self Care | Attending: Internal Medicine | Admitting: Internal Medicine

## 2010-08-23 LAB — DIFFERENTIAL
Basophils Absolute: 0 10*3/uL (ref 0.0–0.1)
Lymphocytes Relative: 22 % (ref 12–46)
Monocytes Relative: 14 % — ABNORMAL HIGH (ref 3–12)
Neutro Abs: 2.4 10*3/uL (ref 1.7–7.7)

## 2010-08-23 LAB — CK TOTAL AND CKMB (NOT AT ARMC)
CK, MB: 5.7 ng/mL — ABNORMAL HIGH (ref 0.3–4.0)
Relative Index: 5.1 — ABNORMAL HIGH (ref 0.0–2.5)
Total CK: 112 U/L (ref 7–177)

## 2010-08-23 LAB — URINALYSIS, ROUTINE W REFLEX MICROSCOPIC
Glucose, UA: NEGATIVE mg/dL
Hgb urine dipstick: NEGATIVE
Specific Gravity, Urine: 1.013 (ref 1.005–1.030)
Urobilinogen, UA: 1 mg/dL (ref 0.0–1.0)

## 2010-08-23 LAB — CBC
Hemoglobin: 11.4 g/dL — ABNORMAL LOW (ref 12.0–15.0)
MCH: 31.8 pg (ref 26.0–34.0)
MCH: 32.1 pg (ref 26.0–34.0)
Platelets: ADEQUATE 10*3/uL (ref 150–400)
RBC: 3.59 MIL/uL — ABNORMAL LOW (ref 3.87–5.11)
RBC: 3.4 MIL/uL — ABNORMAL LOW (ref 3.87–5.11)
RDW: 15.5 % (ref 11.5–15.5)
WBC: 4.5 10*3/uL (ref 4.0–10.5)

## 2010-08-23 LAB — BLOOD GAS, VENOUS
Acid-base deficit: 1.2 mmol/L (ref 0.0–2.0)
Delivery systems: POSITIVE
FIO2: 0.5 %
pCO2, Ven: 40 mmHg — ABNORMAL LOW (ref 45.0–50.0)
pH, Ven: 7.381 — ABNORMAL HIGH (ref 7.250–7.300)
pO2, Ven: 53.9 mmHg — ABNORMAL HIGH (ref 30.0–45.0)

## 2010-08-23 LAB — COMPREHENSIVE METABOLIC PANEL
ALT: 25 U/L (ref 0–35)
AST: 43 U/L — ABNORMAL HIGH (ref 0–37)
Alkaline Phosphatase: 84 U/L (ref 39–117)
CO2: 28 mEq/L (ref 19–32)
Chloride: 103 mEq/L (ref 96–112)
Creatinine, Ser: 0.81 mg/dL (ref 0.4–1.2)
GFR calc non Af Amer: >60 mL/min (ref >60)
Potassium: 3.9 mEq/L (ref 3.5–5.1)
Total Bilirubin: 0.8 mg/dL (ref 0.3–1.2)

## 2010-08-23 LAB — GLUCOSE, CAPILLARY
Glucose-Capillary: 141 mg/dL — ABNORMAL HIGH (ref 70–99)
Glucose-Capillary: 131 mg/dL — ABNORMAL HIGH (ref 70–99)
Glucose-Capillary: 127 mg/dL — ABNORMAL HIGH (ref 70–99)
Glucose-Capillary: 140 mg/dL — ABNORMAL HIGH (ref 70–99)

## 2010-08-23 LAB — HEMOGLOBIN A1C
Hgb A1c MFr Bld: 5.9 % — ABNORMAL HIGH (ref <5.7)
Mean Plasma Glucose: 123 mg/dL — ABNORMAL HIGH (ref <117)

## 2010-08-23 LAB — PROTIME-INR: Prothrombin Time: 25 seconds — ABNORMAL HIGH (ref 11.6–15.2)

## 2010-08-23 LAB — CARDIAC PANEL(CRET KIN+CKTOT+MB+TROPI)
CK, MB: 2.7 ng/mL (ref 0.3–4.0)
Relative Index: 3.6 — ABNORMAL HIGH (ref 0.0–2.5)
Total CK: 103 U/L (ref 7–177)

## 2010-08-23 LAB — PRO B NATRIURETIC PEPTIDE: Pro B Natriuretic peptide (BNP): 3909 pg/mL — ABNORMAL HIGH (ref 0–450)

## 2010-08-23 LAB — TSH: TSH: 1.231 u[IU]/mL (ref 0.350–4.500)

## 2010-08-24 LAB — BASIC METABOLIC PANEL
BUN: 33 mg/dL — ABNORMAL HIGH (ref 6–23)
CO2: 26 mEq/L (ref 19–32)
Calcium: 9 mg/dL (ref 8.4–10.5)
Chloride: 106 mEq/L (ref 96–112)
Creatinine, Ser: 1.04 mg/dL (ref 0.4–1.2)
GFR calc Af Amer: >60 mL/min (ref >60)

## 2010-08-24 LAB — CBC
HCT: 30.1 % — ABNORMAL LOW (ref 36.0–46.0)
MCH: 32.3 pg (ref 26.0–34.0)
MCV: 94.4 fL (ref 78.0–100.0)
Platelets: 96 10*3/uL — ABNORMAL LOW (ref 150–400)
RDW: 15.5 % (ref 11.5–15.5)

## 2010-08-24 LAB — GLUCOSE, CAPILLARY
Glucose-Capillary: 161 mg/dL — ABNORMAL HIGH (ref 70–99)
Glucose-Capillary: 142 mg/dL — ABNORMAL HIGH (ref 70–99)

## 2010-08-24 LAB — URINE CULTURE: Culture: NO GROWTH

## 2010-08-24 LAB — PROTIME-INR: Prothrombin Time: 26.1 seconds — ABNORMAL HIGH (ref 11.6–15.2)

## 2010-08-25 ENCOUNTER — Inpatient Hospital Stay (HOSPITAL_COMMUNITY): Payer: Medicare Other

## 2010-08-25 LAB — BASIC METABOLIC PANEL
BUN: 33 mg/dL — ABNORMAL HIGH (ref 6–23)
Calcium: 9.1 mg/dL (ref 8.4–10.5)
GFR calc non Af Amer: 54 mL/min — ABNORMAL LOW (ref >60)
Glucose, Bld: 115 mg/dL — ABNORMAL HIGH (ref 70–99)
Potassium: 4 mEq/L (ref 3.5–5.1)

## 2010-08-25 LAB — HEPARIN LEVEL (UNFRACTIONATED): Heparin Unfractionated: <0.1 IU/mL — ABNORMAL LOW (ref 0.30–0.70)

## 2010-08-25 LAB — GLUCOSE, CAPILLARY

## 2010-08-25 LAB — CBC
MCH: 30.7 pg (ref 26.0–34.0)
MCHC: 32.7 g/dL (ref 30.0–36.0)
Platelets: 108 10*3/uL — ABNORMAL LOW (ref 150–400)

## 2010-08-26 ENCOUNTER — Inpatient Hospital Stay (HOSPITAL_COMMUNITY): Payer: Medicare Other

## 2010-08-26 ENCOUNTER — Inpatient Hospital Stay (HOSPITAL_COMMUNITY)
Admission: AD | Admit: 2010-08-26 | Payer: Self-pay | Source: Other Acute Inpatient Hospital | Admitting: Internal Medicine

## 2010-08-26 LAB — GLUCOSE, CAPILLARY
Glucose-Capillary: 161 mg/dL — ABNORMAL HIGH (ref 70–99)
Glucose-Capillary: 129 mg/dL — ABNORMAL HIGH (ref 70–99)
Glucose-Capillary: 176 mg/dL — ABNORMAL HIGH (ref 70–99)

## 2010-08-26 LAB — CBC
HCT: 31 % — ABNORMAL LOW (ref 36.0–46.0)
Hemoglobin: 10.4 g/dL — ABNORMAL LOW (ref 12.0–15.0)
MCV: 95.1 fL (ref 78.0–100.0)
RDW: 15.4 % (ref 11.5–15.5)
WBC: 5.1 10*3/uL (ref 4.0–10.5)

## 2010-08-26 LAB — BASIC METABOLIC PANEL
BUN: 30 mg/dL — ABNORMAL HIGH (ref 6–23)
CO2: 24 mEq/L (ref 19–32)
Chloride: 107 mEq/L (ref 96–112)
Creatinine, Ser: 0.86 mg/dL (ref 0.4–1.2)
Glucose, Bld: 133 mg/dL — ABNORMAL HIGH (ref 70–99)

## 2010-08-27 ENCOUNTER — Inpatient Hospital Stay (HOSPITAL_COMMUNITY): Payer: Medicare Other

## 2010-08-27 ENCOUNTER — Inpatient Hospital Stay (HOSPITAL_COMMUNITY)
Admission: EM | Admit: 2010-08-27 | Discharge: 2010-09-02 | DRG: 286 | Disposition: A | Discharge status: Home Health | Payer: Medicare Other | Attending: Internal Medicine | Admitting: Internal Medicine

## 2010-08-27 ENCOUNTER — Inpatient Hospital Stay
Admission: AD | Admit: 2010-08-27 | Payer: Self-pay | Source: Other Acute Inpatient Hospital | Admitting: Internal Medicine

## 2010-08-27 LAB — CARDIAC PANEL(CRET KIN+CKTOT+MB+TROPI)
CK, MB: 4.5 ng/mL — ABNORMAL HIGH (ref 0.3–4.0)
Relative Index: 3.1 — ABNORMAL HIGH (ref 0.0–2.5)
Troponin I: <0.3 ng/mL (ref <0.30)

## 2010-08-27 LAB — CBC
Platelets: 107 10*3/uL — ABNORMAL LOW (ref 150–400)
RBC: 3.13 MIL/uL — ABNORMAL LOW (ref 3.87–5.11)
RDW: 15.2 % (ref 11.5–15.5)
WBC: 5.8 10*3/uL (ref 4.0–10.5)

## 2010-08-27 LAB — POCT I-STAT 3, VENOUS BLOOD GAS (G3P V)
O2 Saturation: 65 %
TCO2: 25 mmol/L (ref 0–100)
pCO2, Ven: 40.3 mmHg — ABNORMAL LOW (ref 45.0–50.0)
pH, Ven: 7.375 — ABNORMAL HIGH (ref 7.250–7.300)

## 2010-08-27 LAB — BASIC METABOLIC PANEL
CO2: 25 mEq/L (ref 19–32)
Chloride: 109 mEq/L (ref 96–112)
Creatinine, Ser: 0.84 mg/dL (ref 0.4–1.2)
GFR calc Af Amer: >60 mL/min (ref >60)
Sodium: 142 mEq/L (ref 135–145)

## 2010-08-27 LAB — POCT I-STAT 3, ART BLOOD GAS (G3+)
Acid-base deficit: 2 mmol/L (ref 0.0–2.0)
Bicarbonate: 22.3 mEq/L (ref 20.0–24.0)
pH, Arterial: 7.399 (ref 7.350–7.400)
pO2, Arterial: 77 mmHg — ABNORMAL LOW (ref 80.0–100.0)

## 2010-08-27 LAB — GLUCOSE, CAPILLARY
Glucose-Capillary: 178 mg/dL — ABNORMAL HIGH (ref 70–99)
Glucose-Capillary: 129 mg/dL — ABNORMAL HIGH (ref 70–99)
Glucose-Capillary: 152 mg/dL — ABNORMAL HIGH (ref 70–99)

## 2010-08-27 NOTE — Discharge Summary (Addendum)
[name], [name] NO.:  [id number]  MEDICAL RECORD NO.:  [id number]  LOCATION:  2503                         FACILITY:  [facility]  PHYSICIAN:  Thad Ranger, MD       DATE OF BIRTH:  May 23, 1929  DATE OF ADMISSION:  08/17/2010 DATE OF DISCHARGE:                        DISCHARGE SUMMARY - REFERRING   PRIMARY CARE PHYSICIAN:  [name], MD  DISCHARGE DIAGNOSES: 1. Acute on chronic congestive heart exacerbation. 2. Anemia likely secondary to epistaxis prior to the admission and     occult GI bleed. 3. Nonischemic cardiomyopathy. 4. Paroxysmal atrial fibrillation on Coumadin. 5. Chronic obstructive pulmonary disease. 6. Generalized debility. 7. Mildly elevated troponin secondary to congestive heart failure. 8. Diabetes mellitus 2. 9. History of chronic left bundle-branch block. 10.Hypertension.  CONSULTATIONS: 1. Cardiology, Dr. Allyson Sabal. 2. Gastroenterology, Dr. Everardo All. Madilyn Fireman.  DISCHARGE MEDICATIONS: 1. Ocuvite 1 tablet p.o. daily. 2. Albuterol inhaler 2 puffs inhaled daily as needed for shortness of     breath. 3. Os-Cal D 1 tablet p.o. daily. 4. Citalopram 20 mg p.o. q.a.m. 5. Colace 2 tablets p.o. daily. 6. Famotidine 20 mg p.o. b.i.d. 7. Ferrous sulfate 1 tablet p.o. b.i.d. 8. Lasix 80 mg daily. 9. Hydrocodone/APAP 5/500, 1 tablet every 4 hours as needed for pain. 10.Imdur 30 mg p.o. daily. 11.Lantus 23 units subcu daily. 12.Lovastatin 40 mg p.o. q.a.m. 13.Metoprolol 25 mg p.o. b.i.d. 14.Nystatin 1 application topically twice daily as needed. 15.Warfarin, the patient's home dose of 5 mg Monday and Friday and 2.5     mg on all other days.  BRIEF HISTORY OF PRESENT ILLNESS AT THE TIME OF ADMISSION:  [name] is a 77 year old female who presented with increasing shortness of breath and chest pain.  The patient stated that chest pain was substernal and nonradiating with no orthopnea, PND, fever, chills, or diaphoresis.  She denied any black  stool or bloody stool but had been feeling weak.  The patient has a hemoglobin of previously close to 10. Evaluation in the emergency room included a chest x-ray which showed CHF, BNP elevated at 2293 with EKG showing left bundle-branch block. The patient was given Lasix intravenously and felt better and Hospitalist Service was requested to admit the patient.  RADIOLOGICAL DATA:  Chest x-ray 2-view June 4, CHF.  Chest CT angiogram of the chest June 6 showed: 1. Negative for PE. 2. Marked cardiomegaly. 3. Very small bilateral pleural effusions. 4. Nausea, liver border compatible with cirrhosis.  Echocardiogram June 5 showed EF of 45-50% and systolic function mildly reduced incoordinating to septal motion and lateral to inferolateral hypokinesis.  BRIEF HOSPITALIZATION COURSE:  [name] is a 78 year old female with a history of CHF was admitted with acute-on-chronic CHF exacerbation and worsening anemia. 1. Acute-on-chronic CHF exacerbation.  The patient was admitted to the     monitored floor.  Troponin's were mildly positive secondary to the     CHF.  However, Cardiology consultation was obtained.  The patient     underwent 2-D echocardiogram which did show EF of 45-50% with     lateral to inferolateral hypokinesis.  The patient was continued on     IV diuresis originally and per Cardiology  recommendation     transitioned to p.o. Lasix at 80 mg daily.  At the time of     discharge, this CHF has improved. 2. Anemia.  The patient was found to have hemoglobin of 8.7 with a     baseline of 10.  The patient and her family reported that she had     significant epistaxis prior to admission requiring cauterization     which may have been the precipitating factor with anemia.  The     patient was given 3 units of packed RBC transfusion.  Anemia panel     was also checked which showed iron percent saturation low at 19,     however, ferritin within normal range.  The patient was  continued     on iron.  D-dimer was elevated at 0.71 which prompted the CT     angiogram of the chest which was negative for PE.  During     hospitalization, the patient also had right lower extremity edema     which prompted Doppler ultrasound of the lower extremity which was     negative for any DVT.  The patient was continued on Coumadin.  FOBT     was positive for occult blood and GI consultation was obtained.     Per Gastroenterology, the patient will benefit from outpatient     workup and once the outpatient visit will be scheduled, then hold     Coumadin 5 days prior.  The patient will be discharged home today. 3. Generalized debility.  Physical therapy evaluation was done during     the hospitalization and initially she had refused home health     PT/OT, however, subsequently agreed to the home PT followup.  We     will arrange home physical therapy prior to the discharge.  DISCHARGE PHYSICAL EXAMINATION:  VITAL SIGNS:  Blood pressure 134/62, temperature 98.8, pulse 76, respirations 17, O2 sats 93% on room air. GENERAL:  The patient is alert, awake, and oriented x3 not in acute distress. HEENT:  Negative sclerae and conjunctivae.  Pupils reactive to light and accommodation.  EOMI. NECK:  Supple.  No lymphadenopathy. CVS:  S1 and S2 clear. CHEST:  Fairly clear to auscultation bilaterally. ABDOMEN:  Soft, nontender, nondistended.  Normal bowel sounds. EXTREMITIES:  No cyanosis, clubbing, or edema noted.  DISCHARGE FOLLOWUP:  With Dr. Royann Shivers in 2-3 weeks, Dorena Cookey in next 2-3 weeks and Dr. [name] in next 7-10 days.  60 mL a copy of this patient was apparently Allena Katz Dr..  A very Dr. Amanda Cockayne and incisional pain.  DISCHARGE TIME:  35 minutes.     Thad Ranger, MD     RR/MEDQ  D:  08/22/2010  T:  08/22/2010  Job:  [id number]  cc:   [name], MD Nanetta Batty, M.D. Thurmon Fair, MD Everardo All. Madilyn Fireman, M.D.  Electronically Signed by Andres Labrum RAI  on  08/27/2010 03:30:42 PM

## 2010-08-27 NOTE — H&P (Addendum)
[name], [name]:  [id number]  MEDICAL RECORD NO.:  [id number]           PATIENT TYPE:  E  LOCATION:  [facility]                         FACILITY:  [facility]  PHYSICIAN:  Houston Siren, MD           DATE OF BIRTH:  [birth year]  DATE OF ADMISSION:  08/17/2010 DATE OF DISCHARGE:                             HISTORY & PHYSICAL   PRIMARY CARE PHYSICIAN:  [name], MD  ADVANCE DIRECTIVE:  Full code.  REASON FOR ADMISSION:  Anemia and congestive heart failure.  HISTORY OF PRESENT ILLNESS:  [name] is a 77 year old female with history of type 2 diabetes, hypertension, diastolic dysfunction, anemia, atrial fibrillation on chronic anticoagulation, chronic left bundle branch block, and emphysema, presents to the emergency room because of increased shortness of breath and chest pain.  She stated her chest pain has been substernal and nonradiating.  There has been no orthopnea, PND, fever, chills, cough, or diaphoresis.  She has been feeling weak also. She denied any black stool or bloody stool.  She has no abdominal pain. Evaluation in emergency room included a chest x-ray which shows congestive heart failure.  ABG shows 7.3, 8.39, PaO2 54.  She has a normal white count, and her hemoglobin was found to be 8.2 g/dl.  Her baseline hemoglobin is usually close to 10 g/dL.  She has a blood sugar of 278, and a normal creatinine of 1.0.  EKG shows a left bundle branch block and her BNP is elevated at 2293.   She was given Lasix 80 mg intravenously and felt better.  Hospitalist was asked to admit the patient for congestive heart failure and anemia.  PAST MEDICAL HISTORY:  COPD; hyperlipidemia; diabetes, type 2; hypertension; chronic left bundle branch block; and systolic and diastolic congestive heart failure, ejection fraction around 45%.  MEDICATIONS: 1. Lantus 15 units b.i.d. 2. Albuterol. 3. Caltrate. 4. Citalopram 20 mg per day. 5. Pepcid 20 mg b.i.d. 6. Lasix 40  mg daily. 7. Imdur 30 mg daily. 8. Lovastatin 40 mg per day. 9. Lopressor 25 mg b.i.d. 10.Coumadin. 11.Nystatin p.r.n.  ALLERGY:  PAROXETINE and TRAMADOL.  SOCIAL HISTORY:  She was a prior smoker.  She drinks only occasionally and denied drug use.  FAMILY HISTORY:  Father with coronary artery disease.  Mother with diabetes, hypertension, and pulmonary embolism.  PHYSICAL EXAMINATION:  VITAL SIGNS:  Blood pressure 120/50, pulse of 70, respiratory rate of 19, and temp 98.3. GENERAL:  She is slightly dyspneic as she has tachypnea. HEENT:  Her conjunctivae are pink.  Her throat is clear.  Speech is fluent. NECK:  Supple. TONGUE:  Midline.  She does not have any stridor. LUNGS:  Crackle at both bases, but no wheezes. HEART:  S1 and S2.  Irregular rhythm.  I would did not hear any friction rub or any S3 or any S4. ABDOMEN:  Soft, obese, nondistended, and nontender.  No palpable mass. EXTREMITIES:  No edema.  No calf tenderness and she has neither central nor peripheral cyanosis.  OBJECTIVE FINDINGS:  Chest x-ray showed congestive heart failure with interstitial edema.  Arterial blood gas;  7.3, 8.39, PaO2 54, hemoglobin 8.2, blood glucose of 278, creatinine of 1.0.  BNP 2293.  IMPRESSION:  This is a 77 year old female with history of anemia and congestive heart failure, presents with congestive heart failure and worsening anemia.  She will need blood, and I will give her 2 units of packed red blood cells.  We will pretreat her transfusion with 40 mg IV Lasix prior to each transfusions.  We will rule out with serial CPKs and troponins.  I would like to continue all her medications including the Imdur, but we will add nitro paste 1 inch q.8 h. to her chest wall temporarily, and once she gets other heart failure, this can be discontinued and the Isordil can be titrated up as needed.  She is a full code.  Will be admitted to [facility].  As soon as her medications are reconciled, we  will continue them.  For her diabetes, we will implement the insulin sliding scale.  I suspect she needs about 80 mg of Lasix IV daily.     Houston Siren, MD     PL/MEDQ  D:  08/17/2010  T:  08/17/2010  Job:  [id number]  Electronically Signed by Houston Siren  on 08/27/2010 09:19:06 PM

## 2010-08-28 ENCOUNTER — Inpatient Hospital Stay (HOSPITAL_COMMUNITY): Payer: Medicare Other

## 2010-08-28 LAB — CBC
Platelets: 107 10*3/uL — ABNORMAL LOW (ref 150–400)
RBC: 2.76 MIL/uL — ABNORMAL LOW (ref 3.87–5.11)
RDW: 15.6 % — ABNORMAL HIGH (ref 11.5–15.5)
WBC: 5.7 10*3/uL (ref 4.0–10.5)

## 2010-08-28 LAB — GLUCOSE, CAPILLARY
Glucose-Capillary: 148 mg/dL — ABNORMAL HIGH (ref 70–99)
Glucose-Capillary: 137 mg/dL — ABNORMAL HIGH (ref 70–99)
Glucose-Capillary: 120 mg/dL — ABNORMAL HIGH (ref 70–99)
Glucose-Capillary: 138 mg/dL — ABNORMAL HIGH (ref 70–99)
Glucose-Capillary: 169 mg/dL — ABNORMAL HIGH (ref 70–99)

## 2010-08-28 LAB — BASIC METABOLIC PANEL
BUN: 32 mg/dL — ABNORMAL HIGH (ref 6–23)
CO2: 23 mEq/L (ref 19–32)
Calcium: 8.3 mg/dL — ABNORMAL LOW (ref 8.4–10.5)
Creatinine, Ser: 0.74 mg/dL (ref 0.50–1.10)
GFR calc non Af Amer: >60 mL/min (ref >60)
Glucose, Bld: 132 mg/dL — ABNORMAL HIGH (ref 70–99)

## 2010-08-28 LAB — PRO B NATRIURETIC PEPTIDE: Pro B Natriuretic peptide (BNP): 4460 pg/mL — ABNORMAL HIGH (ref 0–450)

## 2010-08-28 LAB — CARDIAC PANEL(CRET KIN+CKTOT+MB+TROPI): CK, MB: 6.3 ng/mL (ref 0.3–4.0)

## 2010-08-29 ENCOUNTER — Inpatient Hospital Stay (HOSPITAL_COMMUNITY): Payer: Medicare Other

## 2010-08-29 LAB — GLUCOSE, CAPILLARY
Glucose-Capillary: 113 mg/dL — ABNORMAL HIGH (ref 70–99)
Glucose-Capillary: 176 mg/dL — ABNORMAL HIGH (ref 70–99)
Glucose-Capillary: 129 mg/dL — ABNORMAL HIGH (ref 70–99)

## 2010-08-29 LAB — BASIC METABOLIC PANEL
CO2: 25 mEq/L (ref 19–32)
Chloride: 108 mEq/L (ref 96–112)
Glucose, Bld: 124 mg/dL — ABNORMAL HIGH (ref 70–99)
Sodium: 141 mEq/L (ref 135–145)

## 2010-08-29 LAB — PRO B NATRIURETIC PEPTIDE: Pro B Natriuretic peptide (BNP): 1804 pg/mL — ABNORMAL HIGH (ref 0–450)

## 2010-08-30 LAB — BASIC METABOLIC PANEL
CO2: 27 mEq/L (ref 19–32)
Calcium: 8.7 mg/dL (ref 8.4–10.5)
Chloride: 106 mEq/L (ref 96–112)
Glucose, Bld: 136 mg/dL — ABNORMAL HIGH (ref 70–99)
Potassium: 3.4 mEq/L — ABNORMAL LOW (ref 3.5–5.1)
Sodium: 142 mEq/L (ref 135–145)

## 2010-08-30 LAB — PRO B NATRIURETIC PEPTIDE: Pro B Natriuretic peptide (BNP): 1691 pg/mL — ABNORMAL HIGH (ref 0–450)

## 2010-08-30 LAB — GLUCOSE, CAPILLARY
Glucose-Capillary: 129 mg/dL — ABNORMAL HIGH (ref 70–99)
Glucose-Capillary: 176 mg/dL — ABNORMAL HIGH (ref 70–99)
Glucose-Capillary: 156 mg/dL — ABNORMAL HIGH (ref 70–99)

## 2010-08-31 LAB — BASIC METABOLIC PANEL
BUN: 29 mg/dL — ABNORMAL HIGH (ref 6–23)
Creatinine, Ser: 0.79 mg/dL (ref 0.50–1.10)
GFR calc non Af Amer: >60 mL/min (ref >60)
Glucose, Bld: 119 mg/dL — ABNORMAL HIGH (ref 70–99)
Potassium: 3.3 mEq/L — ABNORMAL LOW (ref 3.5–5.1)

## 2010-08-31 LAB — GLUCOSE, CAPILLARY
Glucose-Capillary: 121 mg/dL — ABNORMAL HIGH (ref 70–99)
Glucose-Capillary: 127 mg/dL — ABNORMAL HIGH (ref 70–99)

## 2010-09-01 LAB — BASIC METABOLIC PANEL
BUN: 31 mg/dL — ABNORMAL HIGH (ref 6–23)
Calcium: 9.1 mg/dL (ref 8.4–10.5)
GFR calc Af Amer: >60 mL/min (ref >60)
GFR calc non Af Amer: 60 mL/min — ABNORMAL LOW (ref >60)
Glucose, Bld: 124 mg/dL — ABNORMAL HIGH (ref 70–99)
Sodium: 143 mEq/L (ref 135–145)

## 2010-09-01 LAB — GLUCOSE, CAPILLARY: Glucose-Capillary: 215 mg/dL — ABNORMAL HIGH (ref 70–99)

## 2010-09-01 LAB — PROTIME-INR: Prothrombin Time: 32.4 seconds — ABNORMAL HIGH (ref 11.6–15.2)

## 2010-09-01 NOTE — Consult Note (Addendum)
[name], [name] NO.:  [id number]  MEDICAL RECORD NO.:  [id number]  LOCATION:  1417                         FACILITY:  [facility]  PHYSICIAN:  Italy Hilty, MD         DATE OF BIRTH:  1929/10/27  DATE OF CONSULTATION:  08/23/2010 DATE OF DISCHARGE:                                CONSULTATION   CHIEF COMPLAINT:  Heart failure, pulmonary edema and shortness of breath.  HISTORY OF PRESENT ILLNESS:  [name] is a 77 year old female patient followed by Dr. Allyson Sabal in the office with a history of hypertension, left bundle branch block and history of syncope in the past as well as nonischemic cardiomyopathy, EF of 45%.  Catheterization in 2009 showed essentially normal coronary arteries with no valvular stenosis, however, echocardiogram just recently performed on August 18, 2010 does show moderate aortic stenosis with a valve area of 1.3.  She also has chronic atrial fibrillation, on Coumadin, and was recently admitted to [facility] with heart failure exacerbation.  She was initially placed on BiPAP and diuresed and improved rapidly.  She was subsequently discharged and the day following her admission she represented with similar symptoms.  On discussion with her today revealed that she felt her heart was racing prior to this episode and she had been taking her medicines that had no dietary discretions.  Also of note, her weight is 2 kg less than her admission weight prior to a recent hospitalization, so I do suspect this is likely secondary to decompensated heart failure.  She fully denies any chest pain, syncope or presyncope.  PAST MEDICAL HISTORY: 1. Nonischemic cardiomyopathy, EF of 45-50%. 2. Chronic left bundle branch block. 3. Anemia secondary to epistaxis. 4. Type 2 diabetes. 5. Hypertension. 6. COPD. 7. Paroxysmal atrial fibrillation, on Coumadin. 8. Hyperlipidemia. 9. Hypertension. 10.Arthritis.  FAMILY HISTORY:  Noncontributory.  SOCIAL  HISTORY:  The patient is a former smoker, not currently.  Denies alcohol, tobacco or recreational drugs.  ALLERGIES:  To PENICILLIN, PAXIL and DARVOCET.  REVIEW OF SYSTEMS:  Significant for shortness of breath, heart fluttering and gurgly breath sounds.  MEDICATIONS ON DISCHARGE:  Reviewed (please see medicine reconciliation form).  PHYSICAL EXAMINATION:  VITAL SIGNS:  Temperature 98.1, pulse is 88 and regular, respiratory rate 18, oxygen saturation 95% on room air.  Weight is 87.8 kg, down from 89 kg at discharge. GENERAL:  In general, awake, alert, oriented, no apparent distress. HEENT:  Pupils equal, round and reactive to light and accommodation. Extraocular muscles intact. NECK:  Supple.  No jugular venous distention.  No carotid bruits. HEART:  Regular rate and rhythm.  Normal S1, S2.  No S3.  No murmurs, rubs or gallops. ABDOMEN:  Soft, nontender.  Bowel sounds were present. EXTREMITIES:  Without significant edema. LUNGS:  With faint bibasilar crackles.  EKG shows a sinus tachycardia with left bundle branch block  Admission portable chest x-ray showed bilateral pleural effusions and worsening interstitial edema on the right greater than left.  IMPRESSION: 1. Acute pulmonary edema, possibly secondary to tachyarrhythmia in the     setting of moderate aortic stenosis. 2. Acute on chronic bilateral pleural effusions. 3. Paroxysmal atrial fibrillation, question uncontrolled.  4. Hypertension. 5. Nonischemic cardiomyopathy, EF of 40-45% without new anginal     symptoms.  PLAN:  Ms. Maguire initially responded well to BiPAP and diuretics.  I would continue aggressive diuresis.  Her BMP is elevated to over 3000 from 700 at her last admission.  In addition I wonder if she is having some occult arrhythmia that is leading to her pulmonary edema.  There is no clear evidence that this is a worsening systolic heart failure exacerbation and she is currently on a good medical regimen.   Her blood pressure is well controlled.  My current recommendation would be to monitor her rhythm and we will continue to diurese her and hopefully titrate up her heart failure medications as tolerated.  Thank you for the consult.     Italy Hilty, MD     CH/MEDQ  D:  08/23/2010  T:  08/23/2010  Job:  [id number]  cc:   Nanetta Batty, M.D. Fax: [phone]  [name], MD Fax: [phone]  Dr. Madilyn Fireman  Electronically Signed by Kirtland Bouchard. HILTY M.D. on 09/01/2010 08:09:52 AM

## 2010-09-02 LAB — GLUCOSE, CAPILLARY
Glucose-Capillary: 249 mg/dL — ABNORMAL HIGH (ref 70–99)
Glucose-Capillary: 163 mg/dL — ABNORMAL HIGH (ref 70–99)

## 2010-09-02 LAB — BASIC METABOLIC PANEL
BUN: 33 mg/dL — ABNORMAL HIGH (ref 6–23)
Chloride: 102 mEq/L (ref 96–112)
GFR calc Af Amer: 55 mL/min — ABNORMAL LOW (ref >60)
GFR calc non Af Amer: 45 mL/min — ABNORMAL LOW (ref >60)
Potassium: 3.6 mEq/L (ref 3.5–5.1)
Sodium: 143 mEq/L (ref 135–145)

## 2010-09-02 LAB — PROTIME-INR: Prothrombin Time: 30.1 seconds — ABNORMAL HIGH (ref 11.6–15.2)

## 2010-09-04 NOTE — Cardiovascular Report (Signed)
NAMEALYZABETH, PONTILLO NO.:  [id number]  MEDICAL RECORD NO.:  [id number]  LOCATION:  2928                         FACILITY:  [facility]  PHYSICIAN:  Nicki Guadalajara, M.D.     DATE OF BIRTH:  [birth year]  DATE OF PROCEDURE: DATE OF DISCHARGE:                           CARDIAC CATHETERIZATION   INDICATIONS:  [name] is a 77 year old female who has a history of hypertension, left bundle-branch block, as well as previously felt to have nonischemic cardiomyopathy with an EF of 45%. Catheterization in 2009 showed essentially normal coronary arteries without significant valvular stenosis.  An echo Doppler recently done raise the possibility of moderate aortic stenosis with a valve area of 1.3.  The patient also has chronic atrial fibrillation, on Coumadin. She was recently admitted to [facility] with heart failure exacerbation.  She had been placed on BiPAP and diuresed rapidly.  She has had recurrent symptomatology with flash pulmonary edema raising the possibility of ischemia mediated symptomatology.  Her last episode was last night.  She has been off Coumadin, waiting for cardiac catheterization and presents today for this procedure with an INR of 1.57.  PROCEDURE:  The patient was prepped and draped in usual fashion.  Versed 1 mg was administered for conscious sedation.  Her right femoral vein was punctured and a 7-French sheath was inserted.  A Swan-Ganz catheter was advanced to the RA, RV, PA and PC positions.  Oxygen saturation was obtained in the pulmonary artery.  Right femoral artery was punctured anteriorly and a 6-French sheath was inserted.  A 5-French pigtail catheter was then advanced and this was able to cross the aortic valve without significant difficulty argue against significant valvular aortic stenosis.  Simultaneous LV and FA pressures were recorded.  O2 saturation was obtained in the left ventricle.  Left ventriculography was then  performed.  Due to the patient's significant respiratory effect, there was significant pressure differential between her inspiratory and expiratory efforts.  She had difficulty in holding her breath to allow for stabilization of pressures.  A LV pullback was then performed.  In addition, because of the patient's recurrent episodes of flash pulmonary edema, distal aortography was also done to make certain she did not have significant bilateral renal artery stenosis.  The pigtail catheter was then removed.  Attention was then directed at the coronary arteries and a 5-French FL-4 and 5-French FR-4 catheter was used for selective angiography into the left and right coronary system. Hemostasis was obtained by direct manual pressure.  The patient tolerated the procedure well.  With her recent recurrent exacerbation of CHF, requiring BiPAP and IV diuresis, she will be kept at [facility] rather than transported back to [facility] for closer observation.  HEMODYNAMIC DATA:  Right atrial pressure mean 14.  Right ventricular pressure 40/7, although EP up to 14 with respiratory affect, PA pressure 40/23, mean pulmonary capillary wedge pressure 20.  Initial LV pressure was 130/16.  FA pressure was fairly equal at approximately 129/65.  On pullback, the LV pressure was 147/21 and although the computer rated as the AO pressure is 140/75.  Review of the tracings indicate that this was due to the respiratory  artifact and that AO pressure was comparable at least 145 on the next several beats fairly comparable to the LV pressure again arguing against significant valvular aortic stenosis.  O2 saturation in the LV was 95% and the PA was 65%.  Cardiac output by the thermodilution method was 5.5 liters per minute and by the Fick method was 5.8 liters per minute.  Cardiac index was 2.9 and 3.1 liters per minute per meter squared respectively.  RAO ventriculography revealed global LV  dysfunction with an ejection fraction in the 35-40% range.  Distal aortography revealed aortic calcification.  There was no significant renal artery stenosis.  There was evidence for small infrarenal fusiform aneurysm in the aorta.  There was evidence for mild coronary calcification.  Left main coronary artery was otherwise angiographically normal and bifurcated into an LAD and left circumflex system.  The LAD was free of significant disease and gave rise to two major diagonal vessels and several septal perforating arteries and extended to the LV apex.  The circumflex vessel was angiographically normal and gave rise to two major marginal vessels.  The right coronary artery was angiographically normal and gave rise to moderate-sized PDA system, which extended to the apex.  There was no significant CAD.  IMPRESSION: 1. Nonischemic cardiomyopathy with an ejection fraction of 35-40%. 2. Mild pulmonary hypertension with a pulmonary artery systolic     pressure approximately 40 mm. 3. No evidence for significant valvular aortic stenosis. 4. Essentially normal coronary arteries. 5. Small infrarenal abdominal aortic aneurysm. 6. No evidence for renal artery stenosis.          ______________________________ Nicki Guadalajara, M.D.     TK/MEDQ  D:  08/27/2010  T:  08/28/2010  Job:  [id number]  cc:   Nanetta Batty, M.D. [place] Hilty, MD  Electronically Signed by Nicki Guadalajara M.D. on 09/04/2010 03:13:21 PM

## 2010-09-06 NOTE — H&P (Addendum)
[name], [name] NO.:  [id number]  MEDICAL RECORD NO.:  [id number]  LOCATION:  1417                         FACILITY:  [facility]  PHYSICIAN:  Michiel Cowboy, MDDATE OF BIRTH:  [birth year]  DATE OF ADMISSION:  08/23/2010 DATE OF DISCHARGE:                             HISTORY & PHYSICAL   PRIMARY CARE PROVIDER:  [name], MD  CARDIOLOGIST:  Nanetta Batty, M.D. and also Thurmon Fair, MD with [facility].  GASTROENTEROLOGIST:  Everardo All. Madilyn Fireman, M.D.  CHIEF COMPLAINT:  Shortness of breath.  HISTORY OF PRESENT ILLNESS:  The patient is a 77 year old female with history of systolic heart failure, diabetes, COPD, and atrial fibrillation, on Coumadin.  The patient was just admitted with flash pulmonary edema last week to [facility].  She had good diuresis and was discharged to home on the 9th around 11 p.m.; on the same day, she developed sudden onset of shortness of breath.  To family, she sounded wheezy and gurgly.  She was brought into the emergency department this time to [facility].  Chest x-ray was found to have flash pulmonary edema.  Initially, the patient required BiPAP, but after receiving IV Lasix, she rapidly diuresed and now on room air, feeling much better. She does describe a short sensation of chest pain that lasted may be a minute or two, was sharp while she was experiencing a lot of shortness of breath.  She also felt like she needed to use the bathroom, but never had any diarrhea.  She also has slight nausea associated with this. Otherwise, review of systems is negative.  She has not had any other trouble since her discharge, was just a few hours before then.  She has not had any heavy intake of salt.  She has used all her medications as appropriate and taking her Lasix as she is supposed to.  She has not noticed any worsening swelling since her discharge and was doing fairly well.  This was again sudden onset.  REVIEW  OF SYSTEMS:  Otherwise unremarkable except for HPI, 10 systems reviewed.  PAST MEDICAL HISTORY:  Significant for: 1. Systolic heart failure with EF of 45%/50%. 2. Anemia likely secondary to epistaxis. 3. Nonischemic cardiomyopathy. 4. Type 2 diabetes. 5. History of chronic left bundle-branch block. 6. Hypertension. 7. Mildly elevated troponin last time during admission, thought to be     secondary to CHF. 8. Debility. 9. COPD. 10.Paroxysmal atrial fibrillation, on Coumadin. 11.Cataracts. 12.Hyperlipidemia. 13.Hypertension. 14.Arthritis. 15.The patient has had a cardiac catheterization done in 2009, which     showed essentially normal coronary arteries with minimal     calcification and mild pulmonary hypertension.  SOCIAL HISTORY:  The patient is a former smoker, but does not smoke now, does not drink or abuse drugs.  FAMILY HISTORY:  Noncontributory.  ALLERGIES: 1. PAXIL. 2. PENICILLIN.  MEDICATIONS:  As per discharge order, which was done just early yesterday include: 1. Ocuvite 1 tablet daily. 2. Albuterol inhaler 2 puffs daily as needed. 3. Cal-Citrate/vitamin D. 4. Magnesium over-the-counter 1 tablet daily. 5. Citalopram 20 mg daily. 6. Colace 2 tablets daily. 7. Famotidine 20 mg twice daily. 8. Ferrous sulfate twice daily. 9. Lasix 80  mg in the morning. 10.Hydrocodone/APAP 5/500 mg 1 tablet every 4 hours as needed.  Of     note, the patient has had some trouble with possible addiction to     pain medications and her primary care provider recommended I should     give it her every 6 hours if that could keep her comfortable. 11.Isosorbide mononitrate 30 mg daily. 12.Lantus 23 units daily. 13.Lovastatin 40 mg daily. 14.Metoprolol 25 mg twice daily. 15.Nystatin twice daily as needed. 16.Coumadin, supposed to take 5 mg on Monday and Friday and 2.5 mg on     all other days.  PHYSICAL EXAMINATION:  VITAL SIGNS:  Temperature 99.0, blood pressure initially was  158/74, pulse 88, respirations initially was 44 now down to 20, satting 100% on BiPAP and now satting 93% on room air. GENERAL:  The patient appears to be in no acute distress, currently breathing comfortably. LUNGS:  There are only minimal crackles at the bases currently, but otherwise has fairly good air movement.  She markedly improved. HEART:  I do appreciate a systolic murmur. LOWER EXTREMITIES:  Without clubbing, cyanosis, or edema. ABDOMEN:  Soft, slightly obese but nontender. NEUROLOGIC:  Grossly intact.  She is hard of hearing, but alert and oriented. SKIN:  No skin breakdown or rashes noted.  LABORATORY DATA:  White blood cell count 4.1, hemoglobin 11.4.  Cardiac enzymes; CK total 112, CK-MB 5.7, troponin less than 0.3.  BNP elevated at 1671 which is above her BNP at discharge, which was 700.  Chest x-ray did show a CHF.  UA is unremarkable.  Yesterday's INR was 1.98.  EKG showing left bundle-branch block.  ASSESSMENT AND PLAN:  This is a 77 year old female with past medical history of recent admission with congestive heart failure, history of diabetes and paroxysmal atrial fibrillation here with what appears to be another onset of acute congestive heart failure and flash pulmonary edema, which has currently improved after diuresis. 1. Acute congestive heart failure, likely systolic.  We will readmit.     She is currently on room air.  We will put her on telemetry, cycle     cardiac enzymes.  She may need another cardiology consult to see     what the cause of recurrent flash pulmonary edema whether she needs     another catheterization.  We will continue with Lasix IV at 40     b.i.d.  This may need to be titrated down to avoid over diuresis.     We will repeat EKG, which she has a history of a left bundle-branch     block, which is old.  Of note, I did notice that the patient was on     an ACE inhibitor, we will hold her lisinopril and watch her     potassium.  She is  already on beta blocker.  Would need to clarify     with Cardiology if there was any evidence of the patient not     tolerating ACE inhibitor in the past, but I do not see any     recommendation concerning this. 2. History of paroxysmal atrial fibrillation.  We will continue     Coumadin and check INR. 3. Diabetes mellitus.  We will put on sliding scale. 4. History of anemia, currently improved. 5. History of chronic obstructive pulmonary disease.  The patient     appears to be currently stable, but we will make sure if she is     right for p.r.n. albuterol  and schedule Atrovent. 6. Prophylaxis.  Protonix and Coumadin. 7. Code status.  The patient wished to be do not resuscitate/do not     intubate, which was confirmed with her family.  Her son is at     bedside. 8. Debility in the past.  The patient need a PT/OT evaluation.  We     will write for that since she was to have this as an outpatient as     well.  I have spent a total of 70 minutes on this admission.     Michiel Cowboy, MD     AVD/MEDQ  D:  08/23/2010  T:  08/23/2010  Job:  [id number]  cc:   [name], MD Fax: [phone]  Nanetta Batty, M.D. Fax: [phone]  Thurmon Fair, MD Fax: [phone]  Everardo All. Madilyn Fireman, M.D. Fax: [phone]  Electronically Signed by Therisa Doyne MD on 09/06/2010 09:38:47 PM

## 2010-09-12 NOTE — Discharge Summary (Signed)
NAMEOLIANA, GOWENS NO.:  [id number]  MEDICAL RECORD NO.:  [id number]  LOCATION:  2034                         FACILITY:  [facility]  PHYSICIAN:  Lonia Blood, M.D.       DATE OF BIRTH:  [birth year]  DATE OF ADMISSION:  08/27/2010 DATE OF DISCHARGE:  09/01/2010                        DISCHARGE SUMMARY - REFERRING   PRIMARY CARE PHYSICIAN:  [name], MD  PRIMARY CARDIOLOGIST:  Thurmon Fair, MD, with [facility].  DISCHARGE DIAGNOSES: 1. Acute respiratory failure resolved. 2. Pulmonary edema resolved. 3. Acute on chronic systolic and diastolic congestive heart fair     exacerbation resolved. 4  Mild pulmonary hypertension. 1. Nonischemic cardiomyopathy as proven by cardiac catheterization. 2. Episode of paroxysmal atrial fibrillation - not resulting in any     symptoms witnessed in the hospital. 3. Diabetes mellitus type 2. 4. Chronic left bundle branch block. 5. Hypertension. 6. Morbid obesity. 7. Chronic back pain with narcotic seeking behavior. 8. Hyperlipidemia. 9. Hypertension. 10.Osteoarthritis. 11.History of tobacco abuse.  DISCHARGE MEDICATIONS: 1. Tylenol 650 mg by mouth every 4 hours needed for pain. 2. Aspirin 81 mg daily. 3. Vicodin 5/325 one tablet every 4 hours needed for back pain. 4. Omeprazole 20 mg daily. 5. MiraLax 17 g by mouth daily. 6. Potassium 20 mEq by mouth twice a day. 7. Senokot 2 tablets by mouth daily. 8. Potassium 20 mEq by mouth twice a day. 9. Senokot 2 tablets by mouth daily as needed for constipation. 10.Lasix 80 mg by mouth twice a day. 11.Lantus 20 units each morning. 12.Ocuvite tablet daily. 13.Albuterol 90 mcg 2 puffs inhale daily as needed. 14.Calcium with vitamin D 1 tablet by mouth daily. 15.Celexa 20 mg daily. 16.Colace 2 tablets by mouth daily. 17.Famotidine 20 mg by mouth daily. 18.Ferrous sulfate 5 gr 1 tablet by mouth twice a day. 19.Imdur 30 mg daily. 20.Lovastatin  40 mg daily. 21.Metoprolol 25 mg twice a day. 22.Nystatin topically twice daily as needed. 23.Coumadin 5 mg Monday and Friday and 2.5 mg Tuesday, Wednesday,     Thursday, Saturday and Sunday.  PROCEDURE ON THIS ADMISSION: 1. On August 23, 2010, the patient had a portable chest x-ray showing     bilateral pleural effusions. 2. August 25, 2010, chest x-ray showing decrease pulmonary edema, small     pleural effusions. 3. Cardiac catheterization August 27, 2010, by Dr. Nicki Guadalajara showing     nonischemic cardiomyopathy, ejection fraction of 35-40%, mild     pulmonary hypertension.  No evidence of significant valvular aortic     stenosis, normal coronary arteries, small infrarenal abdominal     aortic aneurysm. 4. Repeat portable chest x-ray on August 28, 2010, showing resolved     interstitial edema.  HISTORY AND PHYSICAL:  Refer to dictation done by Dr. Adela Glimpse.  CONSULTATION IN THIS ADMISSION:  The patient was seen in consultation by [facility], multiple physicians including Dr. Rennis Golden, Dr. Tresa Endo, Dr. Royann Shivers.  HOSPITAL COURSE:  Ms. Dicenso is a 77 year old woman with multiple medical problems who was discharged from [facility] on August 22, 2010, presented to the emergency room on August 25, 2010, with respiratory distress.  She basically  went home and the same night started to develop significant shortness of breath and had to be brought back to the hospital.  In emergency room on August 23, 2010, had a blood pressure of 150/74, pulse of 88.  She was tachypneic into 44 and she required BiPAP initially.  Her portable chest x-ray on that night showed bilateral pleural effusions, worsening aeration in both lungs, increased interstitial edema right greater than left, suggestive of developing pulmonary edema.  Ms. Moustafa was admitted to the Intensive Care Unit of [facility] where she was seen that morning by Dr. [place] Hilty from [facility].  He felt that this was an episode of acute on chronic congestive heart failure with pulmonary edema of unclear etiology.  The concern was expressed about possible ischemia as inducing these symptoms.  Cardiac enzymes were obtained and all cardiac panel showed troponin-I less than 0.30 not suggestive of myocardial infarction as the etiology of the patient's symptoms.  When investigating with the family, she ate ravioli and a cheese sandwich the night before which both have a high salt load.  The family though vehemently denies as diet being the culprit for her symptoms.  The patient got better with intravenous diuretics and she was moved to [facility] on August 27, 2010, for elective cardiac catheterization.  The cardiac catheterization essentially showed normal coronaries, ejection fraction of 45% suggestive of a nonischemic cardiomyopathy most likely from hypertension with components of systolic, diastolic congestive heart failure.  The patient was continued on aggressive diuresis throughout the hospitalization.  The patient continued to have symptoms of dyspnea and chronic back pain times with back pain and agitation-type behavior. The symptoms were not associated with objective findings of hypoxemia or crackles in the lungs.  There was no documented arrhythmia to proceed these symptoms.  According to the patient's son, Ms. Fritsche has a history of chronic back pain with opiates seeking behavior and indeed she has been asking for her morphine every 1 hour if possible. Otherwise objectively speaking, the patient has continued to diurese well.  As documented per the computer, she is negative for about 3000 mL in the last 72 hours.  Her weight has been stable at 85 kg.  Her last portable chest x-ray from August 28, 2010, shows no resolve interstitial edema and the patient has no crackles on exam.  Objectively speaking to me, she seems to be euvolemic and compensated.  The  family requested that the patient be evaluated for short-term rehabilitation and a physical therapy and occupational therapy evaluation revealed that she might benefit from such a treatment.  She is currently ready as far as I am concerned to transfer to a skilled nursing home with strict fluid restriction 500 mL for 24 hours, low-salt diet of 2000 mg per 24 hours and close followup with Cardiology and primary care.     Lonia Blood, M.D.     SL/MEDQ  D:  09/01/2010  T:  09/01/2010  Job:  [id number]  cc:   Thurmon Fair, MD [name], MD  Electronically Signed by Lonia Blood M.D. on 09/12/2010 04:46:00 PM

## 2010-09-27 NOTE — Discharge Summary (Signed)
  NAMEAFRAH, BURLISON NO.:  [id number]  MEDICAL RECORD NO.:  [id number]  LOCATION:  2034                         FACILITY:  [facility]  PHYSICIAN:  Lonia Blood, M.D.       DATE OF BIRTH:  12/11/1929  DATE OF ADMISSION:  08/27/2010 DATE OF DISCHARGE:  09/02/2010                              DISCHARGE SUMMARY   ADDENDUM  DISCHARGE MEDICATIONS: 1. Zaroxolyn 2.5 mg one tablet by mouth every other day.     Lonia Blood, M.D.     SL/MEDQ  D:  09/17/2010  T:  09/18/2010  Job:  [id number]  Electronically Signed by Lonia Blood M.D. on 09/27/2010 07:39:09 AM

## 2010-09-30 NOTE — Consult Note (Addendum)
[name], [name]              ACCOUNT NO.:  [id number]  MEDICAL RECORD NO.:  [id number]  LOCATION:                                 FACILITY:  PHYSICIAN:  Ercelle Winkles C. Madilyn Fireman, M.D.    DATE OF BIRTH:  Apr 17, 1929  DATE OF CONSULTATION:  08/21/2010 DATE OF DISCHARGE:                                CONSULTATION   REASON FOR CONSULTATION:  Anemia and heme-positive stools.  HISTORY OF PRESENT ILLNESS:  The patient is a 77 year old white female admitted with anemia and congestive heart failure.  She has known atrial fibrillation with diastolic dysfunction, chronic left bundle branch block and emphysema and has had several admissions for congestive heart failure and COPD exacerbations.  She was found to have a hemoglobin of 8.7 with an MCV of 95.8 on admission.  During a previous admission in February, her hemoglobin was 12.1.  She has had a single Hemoccult here which was positive.  She is on Coumadin. and her latest INR at the time of a stool specimen was 2.0.  She was seen by [place] 3 years ago for anemia and heme-positive stools and a colonoscopy was done, which was nondiagnostic and also incomplete without with the cecum not being reached.  EGD showed no source of GI bleeding.  Follow-up barium enema done in the hospital showed a tortuous colon.  No obvious lesions.  It was noted that the right colon was difficult to image.  The patient has also had a barium swallow in February 2012, this showed a small hiatal hernia.  She admits to chronic acid reflux, but has no other GI complaints.  She does state that she sees occasional bright red blood per rectum of small volume.  Her bowels move fine and she denies any dysphagia.  She does not use nonsteroidal anti-inflammatory drugs.  PAST MEDICAL HISTORY:  As outlined above.  Also type 2 diabetes.  MEDICATIONS:  Lantus insulin, albuterol, Caltrate, citalopram, Pepcid, Lasix, Imdur, lovastatin, Lopressor, and Coumadin.  ALLERGIES: 1.  PAXIL. 2. TRAMADOL.  SOCIAL HISTORY:  The patient denies alcohol or tobacco use.  She is a former smoker.  FAMILY HISTORY:  Reportedly positive for colon cancer in her mother.  PHYSICAL EXAMINATION:  GENERAL:  Obese white female, slightly dyspneic. LUNGS:  Clear. HEART:  Irregularly irregular without murmurs, gallops, or rubs. ABDOMEN:  Soft, distended, obese with normoactive bowel sounds.  No hepatosplenomegaly, mass, or guarding.  IMPRESSION:  A 77 year old white female with single heme-positive stool, chronic anemia with some decrease in hemoglobin in the last several months with previous GI workup as outlined above.  PLAN:  It would be reasonable to attempt another colonoscopy although as before it might prove unsuccessful in region of the cecum.  I do not think she needs upper GI evaluation.  Depending on her overall medical and cardiopulmonary status, this could be done as an inpatient or delayed for outpatient.  Ideally, we would like to reverse her Coumadin if this is allowable from a cardiac standpoint.  We will follow with you.    ______________________________ Everardo All. Madilyn Fireman, M.D.     JCH/MEDQ  D:  08/21/2010  T:  08/21/2010  Job:  [id number]  Electronically Signed by Dorena Cookey M.D. on 09/30/2010 06:58:03 PM

## 2010-10-16 ENCOUNTER — Other Ambulatory Visit: Payer: Self-pay | Admitting: Cardiovascular Disease

## 2010-10-16 ENCOUNTER — Ambulatory Visit
Admission: RE | Admit: 2010-10-16 | Discharge: 2010-10-16 | Disposition: A | Discharge status: Home / Self Care | Payer: Medicare Other | Source: Ambulatory Visit | Attending: Cardiovascular Disease | Admitting: Cardiovascular Disease

## 2010-10-16 DIAGNOSIS — R0602 Shortness of breath: Secondary | ICD-10-CM

## 2010-10-16 DIAGNOSIS — I509 Heart failure, unspecified: Secondary | ICD-10-CM

## 2010-10-16 DIAGNOSIS — J449 Chronic obstructive pulmonary disease, unspecified: Secondary | ICD-10-CM

## 2010-10-19 ENCOUNTER — Other Ambulatory Visit: Payer: Self-pay | Admitting: Internal Medicine

## 2010-10-19 ENCOUNTER — Ambulatory Visit
Admission: RE | Admit: 2010-10-19 | Discharge: 2010-10-19 | Disposition: A | Discharge status: Home / Self Care | Payer: Medicare Other | Source: Ambulatory Visit | Attending: Internal Medicine | Admitting: Internal Medicine

## 2010-10-19 DIAGNOSIS — J189 Pneumonia, unspecified organism: Secondary | ICD-10-CM

## 2010-12-11 LAB — CROSSMATCH

## 2010-12-11 LAB — CBC
HCT: 25.1 — ABNORMAL LOW
HCT: 26.3 — ABNORMAL LOW
Hemoglobin: 8.5 — ABNORMAL LOW
MCHC: 33.8
MCHC: 34
MCV: 85.6
MCV: 86.1
Platelets: 155
RDW: 14.4
RDW: 14.6

## 2010-12-11 LAB — ABO/RH: ABO/RH(D): A POS

## 2010-12-11 LAB — POCT CARDIAC MARKERS: Troponin i, poc: <0.05

## 2010-12-11 LAB — IRON AND TIBC
Iron: 25 — ABNORMAL LOW
Saturation Ratios: 8 — ABNORMAL LOW
UIBC: 283

## 2010-12-11 LAB — BASIC METABOLIC PANEL
CO2: 26
Chloride: 105
Glucose, Bld: 79
Potassium: 4.2
Sodium: 138

## 2010-12-11 LAB — GLUCOSE, CAPILLARY
Glucose-Capillary: 102 — ABNORMAL HIGH
Glucose-Capillary: 96

## 2010-12-11 LAB — CK TOTAL AND CKMB (NOT AT ARMC)
CK, MB: 2.6
Relative Index: 2.6 — ABNORMAL HIGH

## 2010-12-11 LAB — DIFFERENTIAL
Basophils Absolute: 0
Basophils Relative: 1
Eosinophils Relative: 3
Monocytes Absolute: 0.6

## 2010-12-11 LAB — TROPONIN I: Troponin I: 0.02

## 2010-12-14 LAB — DIFFERENTIAL
Basophils Relative: 1
Eosinophils Absolute: 0.3
Eosinophils Relative: 3
Monocytes Absolute: 1.2 — ABNORMAL HIGH
Monocytes Relative: 13 — ABNORMAL HIGH
Neutrophils Relative %: 58

## 2010-12-14 LAB — GLUCOSE, CAPILLARY
Glucose-Capillary: 100 — ABNORMAL HIGH
Glucose-Capillary: 120 — ABNORMAL HIGH
Glucose-Capillary: 132 — ABNORMAL HIGH
Glucose-Capillary: 145 — ABNORMAL HIGH
Glucose-Capillary: 153 — ABNORMAL HIGH
Glucose-Capillary: 156 — ABNORMAL HIGH
Glucose-Capillary: 162 — ABNORMAL HIGH
Glucose-Capillary: 172 — ABNORMAL HIGH
Glucose-Capillary: 173 — ABNORMAL HIGH
Glucose-Capillary: 185 — ABNORMAL HIGH
Glucose-Capillary: 216 — ABNORMAL HIGH
Glucose-Capillary: 254 — ABNORMAL HIGH
Glucose-Capillary: 276 — ABNORMAL HIGH
Glucose-Capillary: 314 — ABNORMAL HIGH
Glucose-Capillary: 90

## 2010-12-14 LAB — CBC
HCT: 29.8 — ABNORMAL LOW
HCT: 32.2 — ABNORMAL LOW
HCT: 34.7 — ABNORMAL LOW
Hemoglobin: 11.2 — ABNORMAL LOW
Hemoglobin: 11.4 — ABNORMAL LOW
Hemoglobin: 13.7
MCHC: 34.6
MCHC: 34.9
MCHC: 35.2
MCV: 92.9
MCV: 94.3
Platelets: 118 — ABNORMAL LOW
Platelets: 74 — ABNORMAL LOW
Platelets: 84 — ABNORMAL LOW
RBC: 3.33 — ABNORMAL LOW
RBC: 3.45 — ABNORMAL LOW
RBC: 4.24
RDW: 17.4 — ABNORMAL HIGH
RDW: 17.5 — ABNORMAL HIGH
RDW: 17.7 — ABNORMAL HIGH
RDW: 18 — ABNORMAL HIGH
RDW: 18.1 — ABNORMAL HIGH
WBC: 4.1
WBC: 4.3
WBC: 8.2

## 2010-12-14 LAB — POCT I-STAT 3, VENOUS BLOOD GAS (G3P V)
Acid-base deficit: 2
Bicarbonate: 23.5
O2 Saturation: 62
O2 Saturation: 64
TCO2: 25
pCO2, Ven: 42.1 — ABNORMAL LOW
pO2, Ven: 34

## 2010-12-14 LAB — POCT CARDIAC MARKERS
Myoglobin, poc: 71.7
Troponin i, poc: <0.05

## 2010-12-14 LAB — COMPREHENSIVE METABOLIC PANEL
ALT: 23
ALT: 27
ALT: 34
Albumin: 3.4 — ABNORMAL LOW
Alkaline Phosphatase: 44
Alkaline Phosphatase: 54
BUN: 29 — ABNORMAL HIGH
CO2: 23
Calcium: 9
GFR calc Af Amer: 50 — ABNORMAL LOW
GFR calc non Af Amer: 38 — ABNORMAL LOW
Glucose, Bld: 261 — ABNORMAL HIGH
Glucose, Bld: 263 — ABNORMAL HIGH
Glucose, Bld: 52 — ABNORMAL LOW
Potassium: 3.7
Potassium: 4
Sodium: 136
Sodium: 139
Sodium: 140
Total Protein: 5.5 — ABNORMAL LOW
Total Protein: 6.1
Total Protein: 6.7

## 2010-12-14 LAB — BLOOD GAS, ARTERIAL
Acid-base deficit: 3.9 — ABNORMAL HIGH
Bicarbonate: 20
O2 Saturation: 99.6
Patient temperature: 98.4
TCO2: 21

## 2010-12-14 LAB — URINALYSIS, ROUTINE W REFLEX MICROSCOPIC
Glucose, UA: NEGATIVE
Nitrite: NEGATIVE
pH: 7

## 2010-12-14 LAB — BASIC METABOLIC PANEL
BUN: 20
BUN: 27 — ABNORMAL HIGH
BUN: 29 — ABNORMAL HIGH
CO2: 26
Calcium: 7.8 — ABNORMAL LOW
Calcium: 9
Chloride: 103
Chloride: 107
Chloride: 109
Creatinine, Ser: 1.06
Creatinine, Ser: 1.13
GFR calc Af Amer: 57 — ABNORMAL LOW
GFR calc non Af Amer: 47 — ABNORMAL LOW
Glucose, Bld: 143 — ABNORMAL HIGH
Potassium: 4.3
Potassium: 4.7
Sodium: 138
Sodium: 139

## 2010-12-14 LAB — POCT I-STAT 3, ART BLOOD GAS (G3+)
Acid-Base Excess: 4 — ABNORMAL HIGH
Bicarbonate: 29.3 — ABNORMAL HIGH
O2 Saturation: 100
TCO2: 31
pCO2 arterial: 38
pH, Arterial: 7.419 — ABNORMAL HIGH
pO2, Arterial: 526 — ABNORMAL HIGH
pO2, Arterial: 65 — ABNORMAL LOW

## 2010-12-14 LAB — HEPARIN INDUCED THROMBOCYTOPENIA PNL
Heparin Induced Plt Ab: NEGATIVE
Patient O.D.: 0.172
Serotonin Release: 0 % release (ref <20)

## 2010-12-14 LAB — AMMONIA: Ammonia: 34

## 2010-12-14 LAB — CK TOTAL AND CKMB (NOT AT ARMC)
CK, MB: 3.3
Relative Index: INVALID
Relative Index: INVALID
Total CK: 66
Total CK: 75

## 2010-12-14 LAB — PLATELET COUNT: Platelets: 72 — ABNORMAL LOW

## 2010-12-14 LAB — PROTIME-INR
Prothrombin Time: 14
Prothrombin Time: 15.5 — ABNORMAL HIGH

## 2010-12-14 LAB — HEPARIN ANTIBODY SCREEN: Heparin Antibody Screen: POSITIVE

## 2010-12-14 LAB — TSH: TSH: 1.621

## 2010-12-14 LAB — B-NATRIURETIC PEPTIDE (CONVERTED LAB)
Pro B Natriuretic peptide (BNP): 188 — ABNORMAL HIGH
Pro B Natriuretic peptide (BNP): 343 — ABNORMAL HIGH

## 2010-12-14 LAB — TROPONIN I: Troponin I: 0.1 — ABNORMAL HIGH

## 2010-12-14 LAB — HEPARIN LEVEL (UNFRACTIONATED): Heparin Unfractionated: <0.1 — ABNORMAL LOW

## 2011-03-05 ENCOUNTER — Encounter (HOSPITAL_COMMUNITY)
Admission: RE | Admit: 2011-03-05 | Discharge: 2011-03-05 | Disposition: A | Discharge status: Home / Self Care | Source: Ambulatory Visit | Attending: Family Medicine | Admitting: Family Medicine

## 2011-03-05 DIAGNOSIS — D509 Iron deficiency anemia, unspecified: Secondary | ICD-10-CM | POA: Insufficient documentation

## 2011-03-05 MED ORDER — FERUMOXYTOL INJECTION 510 MG/17 ML
INTRAVENOUS | Status: AC
  Administered 2011-03-05: 510 mg via INTRAVENOUS
  Filled 2011-03-05: qty 17

## 2011-03-05 MED ORDER — FERUMOXYTOL INJECTION 510 MG/17 ML: 510.0000 mg | Freq: Once | INTRAVENOUS | Status: DC

## 2011-03-10 ENCOUNTER — Other Ambulatory Visit (HOSPITAL_COMMUNITY): Payer: Self-pay | Admitting: *Deleted

## 2011-03-12 ENCOUNTER — Encounter (HOSPITAL_COMMUNITY)
Admission: RE | Admit: 2011-03-12 | Discharge: 2011-03-12 | Disposition: A | Discharge status: Home / Self Care | Source: Ambulatory Visit | Attending: Family Medicine | Admitting: Family Medicine

## 2011-03-12 MED ORDER — FERUMOXYTOL INJECTION 510 MG/17 ML
510.0000 mg | Freq: Once | INTRAVENOUS | Status: AC
  Administered 2011-03-12: 510 mg via INTRAVENOUS

## 2011-03-12 MED ORDER — FERUMOXYTOL INJECTION 510 MG/17 ML: 510.0000 mg | INTRAVENOUS | Status: DC

## 2011-03-12 MED ORDER — FERUMOXYTOL INJECTION 510 MG/17 ML
INTRAVENOUS | Status: AC
  Administered 2011-03-12: 510 mg via INTRAVENOUS
  Filled 2011-03-12: qty 17

## 2011-05-25 ENCOUNTER — Encounter (HOSPITAL_COMMUNITY)
Admission: RE | Admit: 2011-05-25 | Discharge: 2011-05-25 | Disposition: A | Discharge status: Home / Self Care | Payer: Medicare Other | Source: Ambulatory Visit | Attending: Family Medicine | Admitting: Family Medicine

## 2011-05-25 DIAGNOSIS — D509 Iron deficiency anemia, unspecified: Secondary | ICD-10-CM | POA: Insufficient documentation

## 2011-05-25 MED ORDER — FERUMOXYTOL INJECTION 510 MG/17 ML
510.0000 mg | INTRAVENOUS | Status: DC
  Administered 2011-05-25: 510 mg via INTRAVENOUS

## 2011-05-25 MED ORDER — SODIUM CHLORIDE 0.9 % IV SOLN
INTRAVENOUS | Status: DC
  Administered 2011-05-25: 15:00:00 via INTRAVENOUS

## 2011-05-25 MED ORDER — FERUMOXYTOL INJECTION 510 MG/17 ML
INTRAVENOUS | Status: AC
  Administered 2011-05-25: 510 mg via INTRAVENOUS
  Filled 2011-05-25: qty 17

## 2011-06-01 ENCOUNTER — Encounter (HOSPITAL_COMMUNITY)
Admission: RE | Admit: 2011-06-01 | Discharge: 2011-06-01 | Disposition: A | Discharge status: Home / Self Care | Payer: Medicare Other | Source: Ambulatory Visit | Attending: Family Medicine | Admitting: Family Medicine

## 2011-06-01 DIAGNOSIS — D509 Iron deficiency anemia, unspecified: Secondary | ICD-10-CM | POA: Diagnosis not present

## 2011-06-01 MED ORDER — SODIUM CHLORIDE 0.9 % IV SOLN
INTRAVENOUS | Status: DC
  Administered 2011-06-01: 10 mL/h via INTRAVENOUS

## 2011-06-01 MED ORDER — FERUMOXYTOL INJECTION 510 MG/17 ML
510.0000 mg | INTRAVENOUS | Status: AC
  Administered 2011-06-01: 510 mg via INTRAVENOUS

## 2011-06-01 MED ORDER — FERUMOXYTOL INJECTION 510 MG/17 ML
INTRAVENOUS | Status: AC
  Administered 2011-06-01: 510 mg via INTRAVENOUS
  Filled 2011-06-01: qty 17

## 2011-08-19 IMAGING — CR DG CHEST 2V
2 series · 2 of 2 positions shown · non-contrast
Comparison: [HOSPITAL] CT angio chest 01/03/2010 and
08/25/2010, chest x-rays 01/03/2010, 01/06/2010, 08/17/2010 and
thoracic spine radiographs 08/29/2010.

CLINICAL DATA: Shortness of breath, congestive heart failure, COPD.

CHEST - 2 VIEW

[view not recorded (1 of 2)]
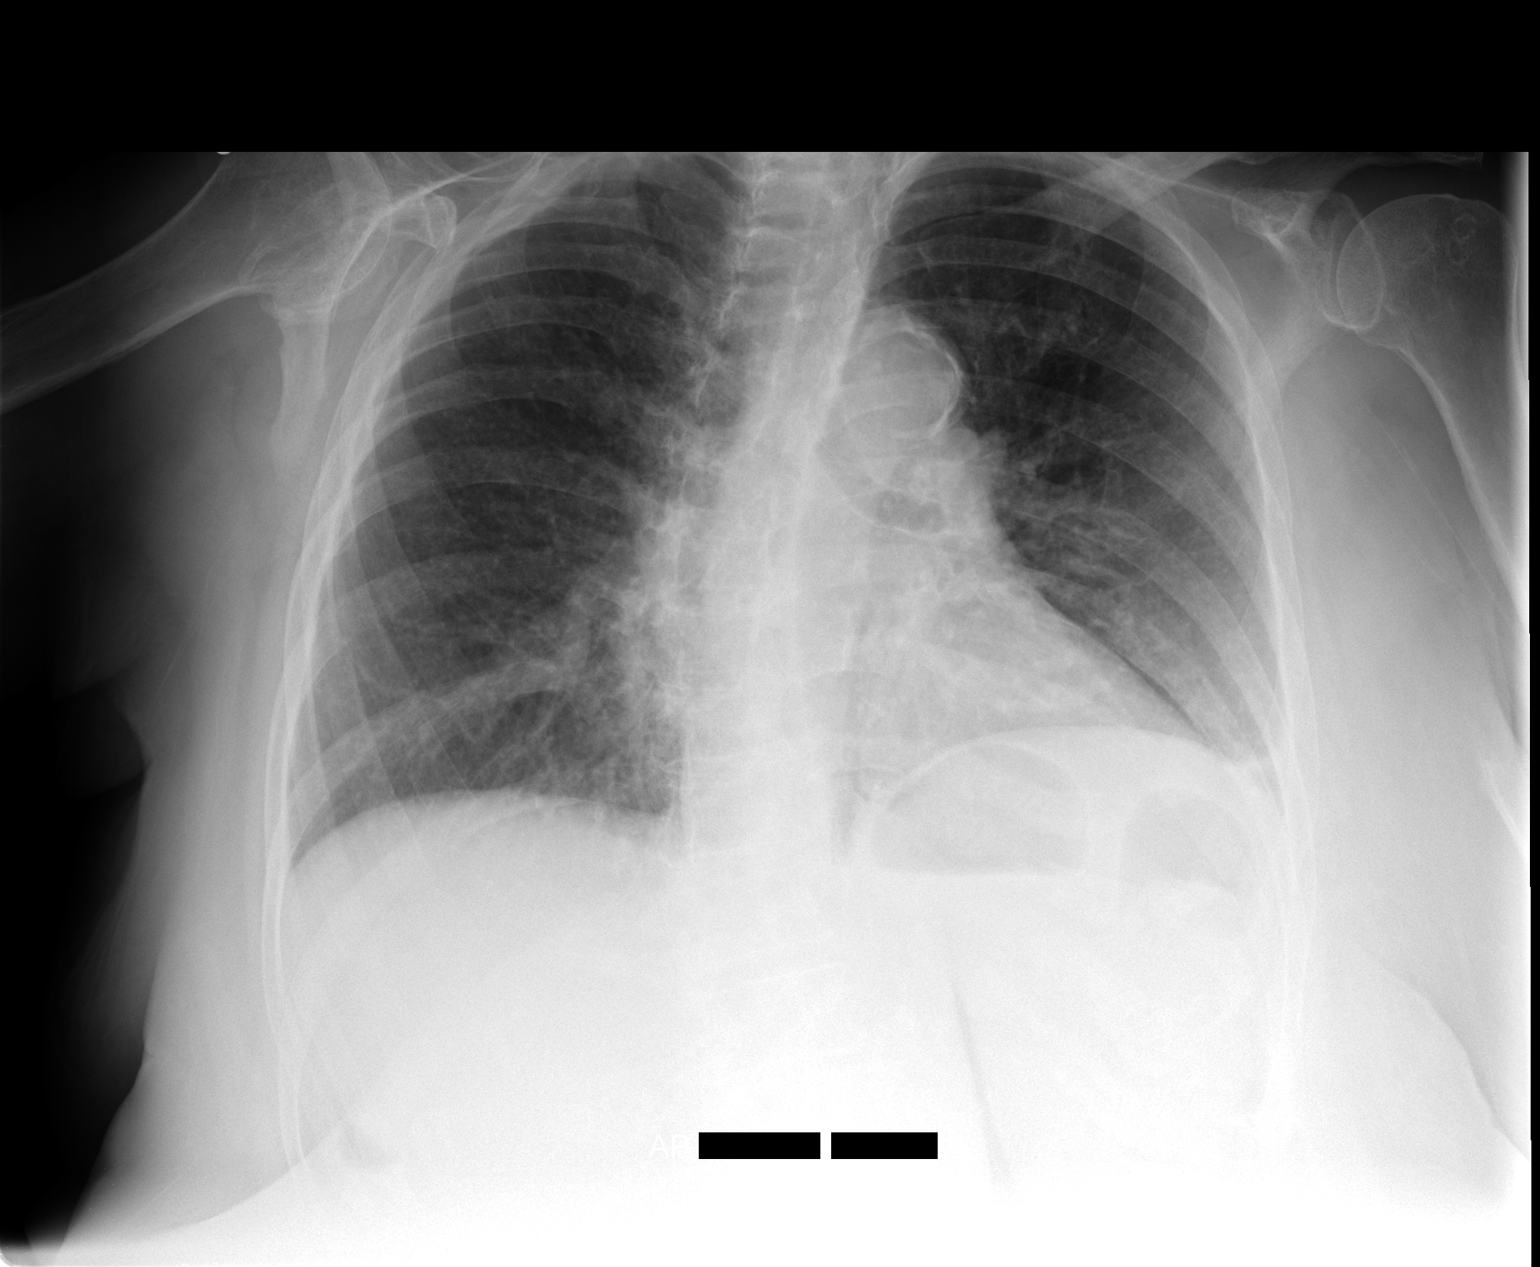

[view not recorded (2 of 2)]
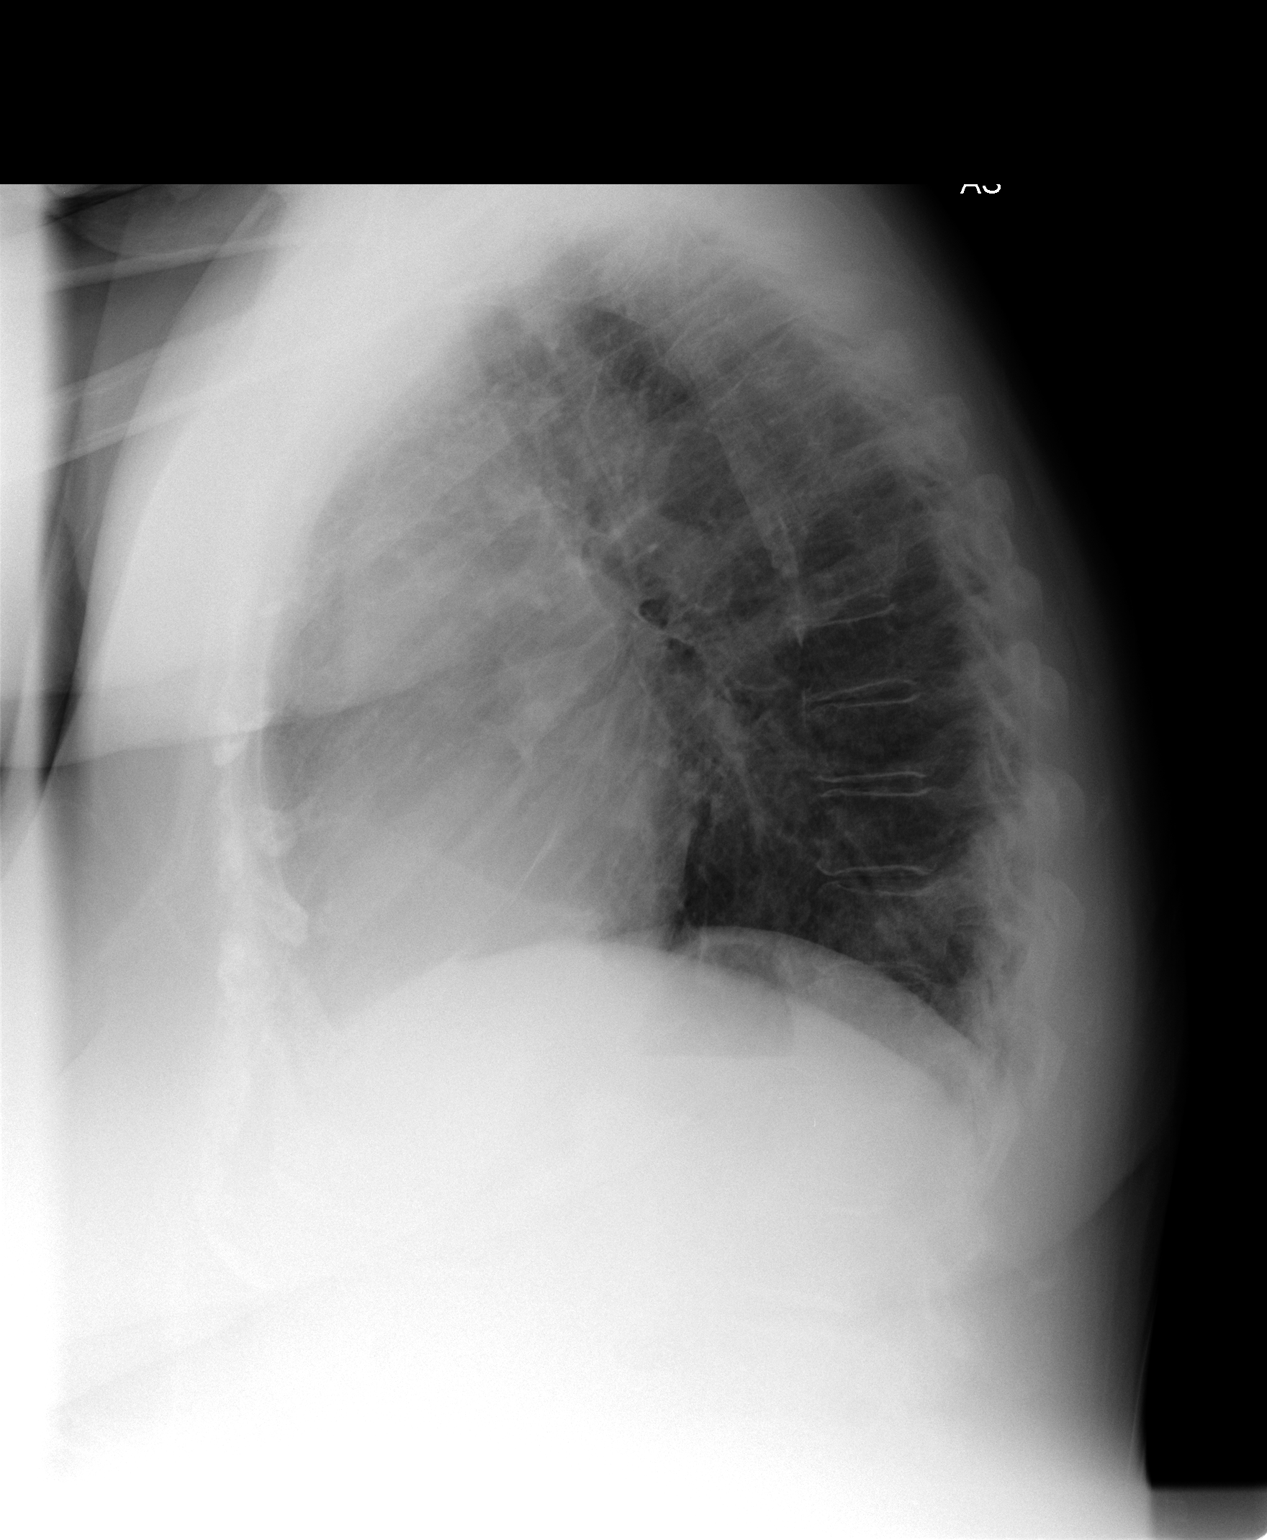

[2 of 2 positions shown; findings below may reference images not displayed]

FINDINGS: Since 08/28/2010, lesser inspiration with slight
elevation left hemidiaphragm, slight patient rotation to left on PA
view, and minimal linear atelectasis lateral left lung base.

Lungs otherwise clear.

Stable slight cardiomegaly and calcified thoracic aortic arch.

Mediastinum, hila, pleura, the dorsal osteopenia stable and
otherwise unremarkable.
IMPRESSION: 1.  Lesser inspiration with slight patient rotation to left on PA
view and slight linear atelectasis lateral left lung base.
2.  Stable slight cardiomegaly.
3.  No active cardiopulmonary disease.

## 2011-08-30 ENCOUNTER — Other Ambulatory Visit: Payer: Self-pay | Admitting: Family Medicine

## 2011-08-30 ENCOUNTER — Ambulatory Visit
Admission: RE | Admit: 2011-08-30 | Discharge: 2011-08-30 | Disposition: A | Discharge status: Home / Self Care | Payer: PRIVATE HEALTH INSURANCE | Source: Ambulatory Visit | Attending: Family Medicine | Admitting: Family Medicine

## 2011-08-30 DIAGNOSIS — R52 Pain, unspecified: Secondary | ICD-10-CM

## 2011-08-30 DIAGNOSIS — W19XXXA Unspecified fall, initial encounter: Secondary | ICD-10-CM

## 2011-10-11 ENCOUNTER — Other Ambulatory Visit: Payer: Self-pay | Admitting: Family Medicine

## 2011-10-11 ENCOUNTER — Ambulatory Visit
Admission: RE | Admit: 2011-10-11 | Discharge: 2011-10-11 | Disposition: A | Discharge status: Home / Self Care | Payer: No Typology Code available for payment source | Source: Ambulatory Visit | Attending: Family Medicine | Admitting: Family Medicine

## 2011-10-11 DIAGNOSIS — R0600 Dyspnea, unspecified: Secondary | ICD-10-CM

## 2012-01-18 ENCOUNTER — Encounter (INDEPENDENT_AMBULATORY_CARE_PROVIDER_SITE_OTHER): Payer: Self-pay | Admitting: General Surgery

## 2012-01-18 ENCOUNTER — Ambulatory Visit (INDEPENDENT_AMBULATORY_CARE_PROVIDER_SITE_OTHER): Admitting: General Surgery

## 2012-01-18 ENCOUNTER — Other Ambulatory Visit (INDEPENDENT_AMBULATORY_CARE_PROVIDER_SITE_OTHER): Payer: Self-pay | Admitting: General Surgery

## 2012-01-18 VITALS — BP 118/64 | HR 92 | Temp 98.6°F | Resp 20 | Ht 61.0 in | Wt 166.0 lb

## 2012-01-18 DIAGNOSIS — L0231 Cutaneous abscess of buttock: Secondary | ICD-10-CM

## 2012-01-18 DIAGNOSIS — L03317 Cellulitis of buttock: Secondary | ICD-10-CM

## 2012-01-18 MED ORDER — OXYCODONE-ACETAMINOPHEN 5-325 MG PO TABS: 1.0000 | ORAL_TABLET | ORAL | Status: DC | PRN

## 2012-01-18 MED ORDER — SULFAMETHOXAZOLE-TRIMETHOPRIM 800-160 MG PO TABS: 1.0000 | ORAL_TABLET | Freq: Two times a day (BID) | ORAL | Status: AC

## 2012-01-18 NOTE — Progress Notes (Signed)
Patient ID: [name], female   DOB: Dec 31, 1929, 78 y.o.   MRN: [id number] The patient is an 78 year old female who is referred by Dr. Modena Jansky for a right renal abscess he states it has been fluctuating in size the last week or 2. Has had no drainage. Patient has had no fevers on home.  On exam patient is 1 cm area of fluctuance with superficial necrosis.  Procedure:  I incise and drain this in the usual sterile fashion with 4 x 4 packing there was minimal amount of purulence. A culture was sent off.  Assessment and plan: Patient is to continue with her schedule antibiotics we will add Septra for 7 days. Patient's remove packing continue with showers on daily basis the  Patient followup x2 weeks. Wound check

## 2012-02-03 ENCOUNTER — Encounter (INDEPENDENT_AMBULATORY_CARE_PROVIDER_SITE_OTHER): Payer: Self-pay | Admitting: General Surgery

## 2012-02-03 ENCOUNTER — Ambulatory Visit (INDEPENDENT_AMBULATORY_CARE_PROVIDER_SITE_OTHER): Admitting: General Surgery

## 2012-02-03 VITALS — BP 120/74 | HR 82 | Temp 98.1°F | Resp 14 | Ht 61.0 in | Wt 156.4 lb

## 2012-02-03 DIAGNOSIS — Z9889 Other specified postprocedural states: Secondary | ICD-10-CM

## 2012-02-03 NOTE — Progress Notes (Signed)
Patient ID: [name], female   DOB: 28-Feb-1930, 78 y.o.   MRN: [id number] The patient is an 78 year old female status post right I&D of a gluteal abscess. Patient has been doing well postoperatively. She is to place at this time.  On exam:  Wounds clean dry and intact little bit fibrous tissue erythema or drainage coming from the area.  Patient follow up when necessary at this time.

## 2012-02-27 ENCOUNTER — Encounter (HOSPITAL_COMMUNITY): Payer: Self-pay | Admitting: *Deleted

## 2012-02-27 ENCOUNTER — Emergency Department (HOSPITAL_COMMUNITY): Payer: Medicare (Managed Care)

## 2012-02-27 ENCOUNTER — Inpatient Hospital Stay (HOSPITAL_COMMUNITY)
Admission: EM | Admit: 2012-02-27 | Discharge: 2012-03-03 | DRG: 291 | Disposition: A | Discharge status: Home / Self Care | Payer: Medicare (Managed Care) | Source: Ambulatory Visit | Attending: Internal Medicine | Admitting: Internal Medicine

## 2012-02-27 DIAGNOSIS — D61818 Other pancytopenia: Secondary | ICD-10-CM | POA: Diagnosis present

## 2012-02-27 DIAGNOSIS — E785 Hyperlipidemia, unspecified: Secondary | ICD-10-CM | POA: Diagnosis present

## 2012-02-27 DIAGNOSIS — K921 Melena: Secondary | ICD-10-CM | POA: Diagnosis present

## 2012-02-27 DIAGNOSIS — K219 Gastro-esophageal reflux disease without esophagitis: Secondary | ICD-10-CM | POA: Diagnosis present

## 2012-02-27 DIAGNOSIS — I4891 Unspecified atrial fibrillation: Secondary | ICD-10-CM | POA: Diagnosis present

## 2012-02-27 DIAGNOSIS — N183 Chronic kidney disease, stage 3 unspecified: Secondary | ICD-10-CM | POA: Diagnosis present

## 2012-02-27 DIAGNOSIS — I10 Essential (primary) hypertension: Secondary | ICD-10-CM | POA: Diagnosis present

## 2012-02-27 DIAGNOSIS — D509 Iron deficiency anemia, unspecified: Secondary | ICD-10-CM | POA: Diagnosis present

## 2012-02-27 DIAGNOSIS — Z22322 Carrier or suspected carrier of Methicillin resistant Staphylococcus aureus: Secondary | ICD-10-CM

## 2012-02-27 DIAGNOSIS — R32 Unspecified urinary incontinence: Secondary | ICD-10-CM | POA: Diagnosis present

## 2012-02-27 DIAGNOSIS — J441 Chronic obstructive pulmonary disease with (acute) exacerbation: Secondary | ICD-10-CM | POA: Diagnosis present

## 2012-02-27 DIAGNOSIS — I5189 Other ill-defined heart diseases: Secondary | ICD-10-CM | POA: Diagnosis present

## 2012-02-27 DIAGNOSIS — F0280 Dementia in other diseases classified elsewhere without behavioral disturbance: Secondary | ICD-10-CM | POA: Diagnosis present

## 2012-02-27 DIAGNOSIS — Z79899 Other long term (current) drug therapy: Secondary | ICD-10-CM

## 2012-02-27 DIAGNOSIS — Z66 Do not resuscitate: Secondary | ICD-10-CM | POA: Diagnosis present

## 2012-02-27 DIAGNOSIS — E119 Type 2 diabetes mellitus without complications: Secondary | ICD-10-CM

## 2012-02-27 DIAGNOSIS — G959 Disease of spinal cord, unspecified: Secondary | ICD-10-CM | POA: Diagnosis present

## 2012-02-27 DIAGNOSIS — D649 Anemia, unspecified: Secondary | ICD-10-CM

## 2012-02-27 DIAGNOSIS — Z7901 Long term (current) use of anticoagulants: Secondary | ICD-10-CM

## 2012-02-27 DIAGNOSIS — M1710 Unilateral primary osteoarthritis, unspecified knee: Secondary | ICD-10-CM | POA: Diagnosis present

## 2012-02-27 DIAGNOSIS — E1139 Type 2 diabetes mellitus with other diabetic ophthalmic complication: Secondary | ICD-10-CM | POA: Diagnosis present

## 2012-02-27 DIAGNOSIS — I509 Heart failure, unspecified: Secondary | ICD-10-CM | POA: Diagnosis present

## 2012-02-27 DIAGNOSIS — I454 Nonspecific intraventricular block: Secondary | ICD-10-CM | POA: Diagnosis present

## 2012-02-27 DIAGNOSIS — I2589 Other forms of chronic ischemic heart disease: Secondary | ICD-10-CM | POA: Diagnosis present

## 2012-02-27 DIAGNOSIS — IMO0002 Reserved for concepts with insufficient information to code with codable children: Secondary | ICD-10-CM | POA: Diagnosis present

## 2012-02-27 DIAGNOSIS — J9600 Acute respiratory failure, unspecified whether with hypoxia or hypercapnia: Secondary | ICD-10-CM | POA: Diagnosis present

## 2012-02-27 DIAGNOSIS — E11329 Type 2 diabetes mellitus with mild nonproliferative diabetic retinopathy without macular edema: Secondary | ICD-10-CM | POA: Diagnosis present

## 2012-02-27 DIAGNOSIS — E559 Vitamin D deficiency, unspecified: Secondary | ICD-10-CM | POA: Diagnosis present

## 2012-02-27 DIAGNOSIS — I5023 Acute on chronic systolic (congestive) heart failure: Secondary | ICD-10-CM | POA: Diagnosis present

## 2012-02-27 DIAGNOSIS — R0602 Shortness of breath: Secondary | ICD-10-CM

## 2012-02-27 DIAGNOSIS — Z8679 Personal history of other diseases of the circulatory system: Secondary | ICD-10-CM

## 2012-02-27 DIAGNOSIS — I129 Hypertensive chronic kidney disease with stage 1 through stage 4 chronic kidney disease, or unspecified chronic kidney disease: Secondary | ICD-10-CM | POA: Diagnosis present

## 2012-02-27 DIAGNOSIS — G8929 Other chronic pain: Secondary | ICD-10-CM | POA: Diagnosis present

## 2012-02-27 DIAGNOSIS — H9190 Unspecified hearing loss, unspecified ear: Secondary | ICD-10-CM | POA: Diagnosis present

## 2012-02-27 DIAGNOSIS — Z87891 Personal history of nicotine dependence: Secondary | ICD-10-CM

## 2012-02-27 DIAGNOSIS — E7800 Pure hypercholesterolemia, unspecified: Secondary | ICD-10-CM | POA: Diagnosis present

## 2012-02-27 DIAGNOSIS — H3530 Unspecified macular degeneration: Secondary | ICD-10-CM | POA: Diagnosis present

## 2012-02-27 DIAGNOSIS — G309 Alzheimer's disease, unspecified: Secondary | ICD-10-CM | POA: Diagnosis present

## 2012-02-27 DIAGNOSIS — R159 Full incontinence of feces: Secondary | ICD-10-CM | POA: Diagnosis present

## 2012-02-27 HISTORY — DX: Unspecified macular degeneration: H35.30

## 2012-02-27 HISTORY — DX: Other chronic pain: G89.29

## 2012-02-27 HISTORY — DX: Vitamin D deficiency, unspecified: E55.9

## 2012-02-27 LAB — POCT I-STAT 3, ART BLOOD GAS (G3+)
Bicarbonate: 20.4 mEq/L (ref 20.0–24.0)
O2 Saturation: 100 %
TCO2: 21 mmol/L (ref 0–100)
pCO2 arterial: 31.3 mmHg — ABNORMAL LOW (ref 35.0–45.0)
pH, Arterial: 7.423 (ref 7.350–7.450)

## 2012-02-27 LAB — BASIC METABOLIC PANEL
BUN: 35 mg/dL — ABNORMAL HIGH (ref 6–23)
Chloride: 98 mEq/L (ref 96–112)
Creatinine, Ser: 1.07 mg/dL (ref 0.50–1.10)
GFR calc Af Amer: 55 mL/min — ABNORMAL LOW (ref >90)
GFR calc non Af Amer: 48 mL/min — ABNORMAL LOW (ref >90)
Potassium: 4.5 mEq/L (ref 3.5–5.1)

## 2012-02-27 LAB — PROTIME-INR
INR: 1.22 (ref 0.00–1.49)
Prothrombin Time: 15.2 seconds (ref 11.6–15.2)

## 2012-02-27 LAB — CBC
HCT: 22.9 % — ABNORMAL LOW (ref 36.0–46.0)
MCHC: 32.3 g/dL (ref 30.0–36.0)
Platelets: 61 10*3/uL — ABNORMAL LOW (ref 150–400)
RDW: 13.7 % (ref 11.5–15.5)
WBC: 3.4 10*3/uL — ABNORMAL LOW (ref 4.0–10.5)

## 2012-02-27 LAB — PRO B NATRIURETIC PEPTIDE: Pro B Natriuretic peptide (BNP): 7174 pg/mL — ABNORMAL HIGH (ref 0–450)

## 2012-02-27 MED ORDER — SODIUM CHLORIDE 0.9 % IJ SOLN: 3.0000 mL | INTRAMUSCULAR | Status: DC | PRN

## 2012-02-27 MED ORDER — LISINOPRIL 10 MG PO TABS
10.0000 mg | ORAL_TABLET | Freq: Every day | ORAL | Status: DC
  Administered 2012-02-28 – 2012-03-03 (×5): 10 mg via ORAL
  Filled 2012-02-27 (×5): qty 1

## 2012-02-27 MED ORDER — INSULIN ASPART 100 UNIT/ML ~~LOC~~ SOLN
0.0000 [IU] | Freq: Every day | SUBCUTANEOUS | Status: DC
  Administered 2012-02-28 (×2): 2 [IU] via SUBCUTANEOUS
  Administered 2012-02-29: 4 [IU] via SUBCUTANEOUS
  Administered 2012-03-01: 5 [IU] via SUBCUTANEOUS

## 2012-02-27 MED ORDER — PANTOPRAZOLE SODIUM 40 MG PO TBEC
40.0000 mg | DELAYED_RELEASE_TABLET | Freq: Every day | ORAL | Status: DC
  Administered 2012-02-28: 40 mg via ORAL
  Filled 2012-02-27: qty 1

## 2012-02-27 MED ORDER — SIMVASTATIN 20 MG PO TABS
20.0000 mg | ORAL_TABLET | Freq: Every day | ORAL | Status: DC
  Administered 2012-02-28 – 2012-03-02 (×4): 20 mg via ORAL
  Filled 2012-02-27 (×6): qty 1

## 2012-02-27 MED ORDER — METHYLPREDNISOLONE SODIUM SUCC 125 MG IJ SOLR
60.0000 mg | INTRAMUSCULAR | Status: DC
  Filled 2012-02-27: qty 0.96

## 2012-02-27 MED ORDER — ACETAMINOPHEN 325 MG PO TABS
650.0000 mg | ORAL_TABLET | Freq: Four times a day (QID) | ORAL | Status: DC | PRN
  Administered 2012-02-29: 650 mg via ORAL
  Filled 2012-02-27 (×2): qty 2

## 2012-02-27 MED ORDER — ACETAMINOPHEN 325 MG PO TABS
650.0000 mg | ORAL_TABLET | Freq: Once | ORAL | Status: AC
  Administered 2012-02-27: 650 mg via ORAL

## 2012-02-27 MED ORDER — ALBUTEROL SULFATE HFA 108 (90 BASE) MCG/ACT IN AERS
2.0000 | INHALATION_SPRAY | RESPIRATORY_TRACT | Status: DC | PRN
  Administered 2012-02-29: 2 via RESPIRATORY_TRACT
  Filled 2012-02-27: qty 6.7

## 2012-02-27 MED ORDER — GUAIFENESIN ER 600 MG PO TB12
600.0000 mg | ORAL_TABLET | Freq: Two times a day (BID) | ORAL | Status: DC
  Administered 2012-02-28 – 2012-03-03 (×10): 600 mg via ORAL
  Filled 2012-02-27 (×11): qty 1

## 2012-02-27 MED ORDER — SODIUM CHLORIDE 0.9 % IV SOLN: 500.0000 mL | Freq: Once | INTRAVENOUS | Status: DC

## 2012-02-27 MED ORDER — ISOSORBIDE MONONITRATE ER 30 MG PO TB24
30.0000 mg | ORAL_TABLET | Freq: Every day | ORAL | Status: DC
  Administered 2012-02-28 – 2012-03-03 (×5): 30 mg via ORAL
  Filled 2012-02-27 (×5): qty 1

## 2012-02-27 MED ORDER — CITALOPRAM HYDROBROMIDE 20 MG PO TABS
20.0000 mg | ORAL_TABLET | Freq: Every day | ORAL | Status: DC
  Administered 2012-02-28 – 2012-03-03 (×5): 20 mg via ORAL
  Filled 2012-02-27 (×5): qty 1

## 2012-02-27 MED ORDER — FUROSEMIDE 10 MG/ML IJ SOLN
20.0000 mg | Freq: Once | INTRAMUSCULAR | Status: AC
  Administered 2012-02-28: 20 mg via INTRAVENOUS
  Filled 2012-02-27: qty 2

## 2012-02-27 MED ORDER — SODIUM CHLORIDE 0.9 % IV SOLN: 250.0000 mL | INTRAVENOUS | Status: DC | PRN

## 2012-02-27 MED ORDER — ACETAMINOPHEN 650 MG RE SUPP: 650.0000 mg | Freq: Four times a day (QID) | RECTAL | Status: DC | PRN

## 2012-02-27 MED ORDER — ASPIRIN 325 MG PO TABS: 325.0000 mg | ORAL_TABLET | ORAL | Status: DC

## 2012-02-27 MED ORDER — METOPROLOL TARTRATE 25 MG PO TABS
25.0000 mg | ORAL_TABLET | Freq: Two times a day (BID) | ORAL | Status: DC
  Administered 2012-02-28 – 2012-02-29 (×5): 25 mg via ORAL
  Filled 2012-02-27 (×7): qty 1

## 2012-02-27 MED ORDER — SODIUM CHLORIDE 0.9 % IJ SOLN
3.0000 mL | Freq: Two times a day (BID) | INTRAMUSCULAR | Status: DC
  Administered 2012-02-28: 3 mL via INTRAVENOUS

## 2012-02-27 MED ORDER — DOCUSATE SODIUM 100 MG PO CAPS
100.0000 mg | ORAL_CAPSULE | Freq: Two times a day (BID) | ORAL | Status: DC
  Administered 2012-02-28 – 2012-03-03 (×9): 100 mg via ORAL
  Filled 2012-02-27 (×11): qty 1

## 2012-02-27 MED ORDER — ALBUTEROL SULFATE (5 MG/ML) 0.5% IN NEBU
2.5000 mg | INHALATION_SOLUTION | RESPIRATORY_TRACT | Status: DC | PRN
  Administered 2012-02-28 – 2012-03-02 (×3): 2.5 mg via RESPIRATORY_TRACT
  Filled 2012-02-27 (×3): qty 0.5

## 2012-02-27 MED ORDER — DIPHENHYDRAMINE HCL 25 MG PO CAPS
25.0000 mg | ORAL_CAPSULE | Freq: Once | ORAL | Status: AC
  Administered 2012-02-27: 25 mg via ORAL
  Filled 2012-02-27: qty 1

## 2012-02-27 MED ORDER — ONDANSETRON HCL 4 MG/2ML IJ SOLN: 4.0000 mg | Freq: Four times a day (QID) | INTRAMUSCULAR | Status: DC | PRN

## 2012-02-27 MED ORDER — ASPIRIN 300 MG RE SUPP
300.0000 mg | Freq: Once | RECTAL | Status: AC
Start: 2012-02-27 — End: 2012-02-27
  Administered 2012-02-27: 300 mg via RECTAL

## 2012-02-27 MED ORDER — ACETAMINOPHEN 650 MG RE SUPP
650.0000 mg | Freq: Once | RECTAL | Status: AC
  Administered 2012-02-27: 650 mg via RECTAL

## 2012-02-27 MED ORDER — SODIUM CHLORIDE 0.9 % IJ SOLN
3.0000 mL | Freq: Two times a day (BID) | INTRAMUSCULAR | Status: DC
  Administered 2012-02-28 – 2012-03-01 (×4): 3 mL via INTRAVENOUS

## 2012-02-27 MED ORDER — METHYLPREDNISOLONE SODIUM SUCC 125 MG IJ SOLR
125.0000 mg | Freq: Once | INTRAMUSCULAR | Status: AC
  Administered 2012-02-27: 125 mg via INTRAVENOUS
  Filled 2012-02-27: qty 2

## 2012-02-27 MED ORDER — IPRATROPIUM BROMIDE 0.02 % IN SOLN
0.5000 mg | Freq: Four times a day (QID) | RESPIRATORY_TRACT | Status: DC
  Administered 2012-02-28 – 2012-02-29 (×5): 0.5 mg via RESPIRATORY_TRACT
  Filled 2012-02-27 (×5): qty 2.5

## 2012-02-27 MED ORDER — NITROGLYCERIN IN D5W 200-5 MCG/ML-% IV SOLN
10.0000 ug/min | INTRAVENOUS | Status: DC
  Administered 2012-02-27: 5 ug/min via INTRAVENOUS
  Filled 2012-02-27: qty 250

## 2012-02-27 MED ORDER — ALBUTEROL SULFATE (5 MG/ML) 0.5% IN NEBU
10.0000 mg | INHALATION_SOLUTION | Freq: Once | RESPIRATORY_TRACT | Status: AC
  Administered 2012-02-27: 10 mg via RESPIRATORY_TRACT
  Filled 2012-02-27: qty 2

## 2012-02-27 MED ORDER — ONDANSETRON HCL 4 MG PO TABS: 4.0000 mg | ORAL_TABLET | Freq: Four times a day (QID) | ORAL | Status: DC | PRN

## 2012-02-27 MED ORDER — FUROSEMIDE 40 MG PO TABS
40.0000 mg | ORAL_TABLET | Freq: Two times a day (BID) | ORAL | Status: DC
  Administered 2012-02-28 – 2012-02-29 (×3): 40 mg via ORAL
  Filled 2012-02-27 (×5): qty 1

## 2012-02-27 MED ORDER — FUROSEMIDE 10 MG/ML IJ SOLN
40.0000 mg | Freq: Once | INTRAMUSCULAR | Status: AC
  Administered 2012-02-27: 40 mg via INTRAVENOUS
  Filled 2012-02-27: qty 4

## 2012-02-27 MED ORDER — INSULIN ASPART 100 UNIT/ML ~~LOC~~ SOLN
0.0000 [IU] | Freq: Three times a day (TID) | SUBCUTANEOUS | Status: DC
  Administered 2012-02-28: 2 [IU] via SUBCUTANEOUS
  Administered 2012-02-28 – 2012-02-29 (×4): 3 [IU] via SUBCUTANEOUS
  Administered 2012-02-29: 2 [IU] via SUBCUTANEOUS
  Administered 2012-03-01: 5 [IU] via SUBCUTANEOUS
  Administered 2012-03-02: 3 [IU] via SUBCUTANEOUS
  Administered 2012-03-02: 2 [IU] via SUBCUTANEOUS
  Administered 2012-03-02: 3 [IU] via SUBCUTANEOUS
  Administered 2012-03-03 (×2): 2 [IU] via SUBCUTANEOUS

## 2012-02-27 MED ORDER — HYDROCODONE-ACETAMINOPHEN 5-325 MG PO TABS
1.0000 | ORAL_TABLET | Freq: Four times a day (QID) | ORAL | Status: DC | PRN
  Administered 2012-02-27 – 2012-03-03 (×17): 1 via ORAL
  Filled 2012-02-27 (×17): qty 1

## 2012-02-27 MED ORDER — SODIUM CHLORIDE 0.9 % IJ SOLN
3.0000 mL | Freq: Two times a day (BID) | INTRAMUSCULAR | Status: DC
  Administered 2012-02-28 – 2012-03-03 (×8): 3 mL via INTRAVENOUS

## 2012-02-27 NOTE — ED Provider Notes (Signed)
History     CSN: [id number]  Arrival date & time 02/27/12  2005   First MD Initiated Contact with Patient 02/27/12 2010      Chief Complaint  Patient presents with  . Respiratory Distress    (Consider location/radiation/quality/duration/timing/severity/associated sxs/prior treatment) Patient is a 78 y.o. female presenting with shortness of breath. The history is provided by the patient.  Shortness of Breath  The current episode started today. The onset was sudden. The problem occurs continuously. The problem has been unchanged. The problem is severe. Nothing relieves the symptoms. Nothing aggravates the symptoms. Associated symptoms include shortness of breath and wheezing. Pertinent negatives include no chest pain, no chest pressure, no fever and no cough. She has had prior hospitalizations. Her past medical history is significant for past wheezing. Past medical history comments: COPD, CHf.    Past Medical History  Diagnosis Date  . Dementia, unspecified, with behavioral disturbance   . Alzheimer's disease   . Diabetes mellitus without complication   . Chronic kidney disease, stage III (moderate)   . Unspecified vitamin D deficiency   . Arthritis   . Hypertension   . Hyperlipidemia   . Depression   . GERD (gastroesophageal reflux disease)   . Cataract   . Chronic kidney disease, stage III (moderate)   . Exudative senile macular degeneration of retina   . Retinal neovascularization NOS   . Unspecified systolic heart failure   . Unspecified diastolic heart failure   . Type II or unspecified type diabetes mellitus with ophthalmic manifestations, not stated as uncontrolled(250.50)   . Mild nonproliferative diabetic retinopathy(362.04)   . Atrial fibrillation   . Pure hypercholesterolemia   . Esophageal reflux   . Blood in stool   . Primary localized osteoarthrosis, lower leg   . Other chronic pain   . Lumbago     Past Surgical History  Procedure Date  . Knee surgery    . Appendectomy   . Tonsillectomy     History reviewed. No pertinent family history.  History  Substance Use Topics  . Smoking status: Former Games developer  . Smokeless tobacco: Never Used  . Alcohol Use: No    OB History    Grav Para Term Preterm Abortions TAB SAB Ect Mult Living                  Review of Systems  Constitutional: Negative for fever.  Respiratory: Positive for shortness of breath and wheezing. Negative for cough.   Cardiovascular: Negative for chest pain.  Gastrointestinal: Negative for vomiting and abdominal pain.  All other systems reviewed and are negative.    Allergies  Paroxetine  Home Medications   Current Outpatient Rx  Name  Route  Sig  Dispense  Refill  . ACETAMINOPHEN 325 MG PO TABS   Oral   Take 650 mg by mouth every 4 (four) hours as needed.         Marland Kitchen PROAIR HFA IN   Inhalation   Inhale into the lungs.         Marland Kitchen CALTRATE 600 PO   Oral   Take by mouth daily.         Marland Kitchen VITAMIN D 1000 UNITS PO TABS   Oral   Take 1,000 Units by mouth daily.         Marland Kitchen CITALOPRAM HYDROBROMIDE 20 MG PO TABS   Oral   Take 20 mg by mouth daily.         . DONEPEZIL HCL  10 MG PO TABS   Oral   Take 10 mg by mouth at bedtime as needed.         . FUROSEMIDE 40 MG PO TABS   Oral   Take 40 mg by mouth daily.         Marland Kitchen HYDROCODONE-ACETAMINOPHEN 5-500 MG PO TABS   Oral   Take 1 tablet by mouth every 6 (six) hours as needed.         . ISOSORBIDE MONONITRATE ER 30 MG PO TB24   Oral   Take 30 mg by mouth daily.         Marland Kitchen LISINOPRIL 10 MG PO TABS   Oral   Take 10 mg by mouth daily.         . IMODIUM PO   Oral   Take by mouth.         Marland Kitchen LOVASTATIN 40 MG PO TABS   Oral   Take 40 mg by mouth at bedtime.         Marland Kitchen METOPROLOL TARTRATE 25 MG PO TABS   Oral   Take 25 mg by mouth 2 (two) times daily.         . OCUVITE PO   Oral   Take by mouth.         . NYSTATIN 100000 UNIT/GM EX CREA   Topical   Apply 1 application  topically 2 (two) times daily.         Marland Kitchen OMEPRAZOLE 20 MG PO CPDR   Oral   Take 20 mg by mouth daily.         . OXYCODONE-ACETAMINOPHEN 5-325 MG PO TABS   Oral   Take 1 tablet by mouth every 4 (four) hours as needed for pain.   20 tablet   0   . POTASSIUM CHLORIDE ER 10 MEQ PO TBCR   Oral   Take 20 mEq by mouth 2 (two) times daily.         Marland Kitchen PREPARATION H RE   Rectal   Place rectally 4 (four) times daily.         Marland Kitchen PSEUDOEPHEDRINE HCL ER 120 MG PO TB12   Oral   Take 120 mg by mouth every 12 (twelve) hours.         . TRIAMCINOLONE ACETONIDE 0.5 % EX CREA   Topical   Apply topically 3 (three) times daily.           There were no vitals taken for this visit.  Physical Exam  Nursing note and vitals reviewed. Constitutional: She is oriented to person, place, and time. She appears well-developed and well-nourished. No distress.  HENT:  Head: Normocephalic and atraumatic.  Eyes: EOM are normal. Pupils are equal, round, and reactive to light.  Neck: Normal range of motion. Neck supple. No JVD present.  Cardiovascular: Normal rate and regular rhythm.  Exam reveals no friction rub.   No murmur heard. Pulmonary/Chest: Effort normal. No respiratory distress. She has wheezes (diffuse). She has rales (diffuse).  Abdominal: Soft. She exhibits no distension. There is no tenderness. There is no rebound.  Musculoskeletal: Normal range of motion. She exhibits no edema.  Neurological: She is alert and oriented to person, place, and time.  Skin: She is not diaphoretic.    ED Course  Procedures (including critical care time)  Labs Reviewed  CBC - Abnormal; Notable for the following:    WBC 3.4 (*)     RBC 2.53 (*)  Hemoglobin 7.4 (*)     HCT 22.9 (*)     Platelets 61 (*)     All other components within normal limits  BASIC METABOLIC PANEL - Abnormal; Notable for the following:    Sodium 131 (*)     Glucose, Bld 187 (*)     BUN 35 (*)     GFR calc non Af Amer 48  (*)     GFR calc Af Amer 55 (*)     All other components within normal limits  PRO B NATRIURETIC PEPTIDE - Abnormal; Notable for the following:    Pro B Natriuretic peptide (BNP) 7174.0 (*)     All other components within normal limits  POCT I-STAT 3, BLOOD GAS (G3+) - Abnormal; Notable for the following:    pCO2 arterial 31.3 (*)     pO2, Arterial 233.0 (*)     Acid-base deficit 4.0 (*)     All other components within normal limits  RETICULOCYTES - Abnormal; Notable for the following:    RBC. 2.35 (*)     All other components within normal limits  TROPONIN I  PROTIME-INR  POCT I-STAT TROPONIN I  TYPE AND SCREEN  PREPARE RBC (CROSSMATCH)  BLOOD GAS, ARTERIAL  MRSA PCR SCREENING  MAGNESIUM  PHOSPHORUS  TSH  COMPREHENSIVE METABOLIC PANEL  CBC  TROPONIN I  TROPONIN I  HEMOGLOBIN A1C  CREATININE, URINE, RANDOM  SODIUM, URINE, RANDOM  OSMOLALITY, URINE  VITAMIN B12  FOLATE  IRON AND TIBC  FERRITIN  TROPONIN I   Dg Chest Port 1 View  02/27/2012  *RADIOLOGY REPORT*  Clinical Data: Respiratory distress.  PORTABLE CHEST - 1 VIEW  Comparison: 10/11/2011  Findings: Heart size and vascularity are normal.  There are no infiltrates or effusions.  No acute osseous abnormality.  IMPRESSION: No acute disease.   Original Report Authenticated By: Francene Boyers, M.D.      1. Type II or unspecified type diabetes mellitus without mention of complication, not stated as uncontrolled   2. Congestive heart failure, unspecified   3. Shortness of breath   4. Anemia     Date: 02/27/2012  Rate:  103  Rhythm: normal sinus rhythm  QRS Axis: normal  Intervals: prolonged QRS  ST/T Wave abnormalities: ST depressions laterally and mildly worse than previous  Conduction Disutrbances:left bundle branch block  Narrative Interpretation:   Old EKG Reviewed: changes noted    MDM   Patient is 78 year old female with history of emphysema, CHF who presents with respiratory distress. Acute onset  earlier this evening. Lasted for about an hour until EMS arrived. EMS place the patient on CPAP and gave patient nitroglycerin with past improvement in her symptoms. On arrival here, patient alert talkative, still having difficulty breathing. Lung exam shows diffuse rales and wheezes. Record review shows recent admission for CHF exacerbation. She had a cath a year and a half ago with clean coronaries, 35-40% ejection fraction with nonischemic cardiomyopathy.  I will treat her with nitroglycerin, BiPAP. We will also draw blood cultures indications his acute pneumonia. Also gave her steroids and albuterol Lyndal Rainbow is a COPD exacerbation. I feel like this is likely her CHF. EKG regarding acute ischemia. Known left branch block. Patient's ABG is not consistent with a COPD exacerbation. her BNP is elevated. She is also found to be anemic. Type and screen is sent. Negative troponin initially. Patient admitted to the stepdown unit on the medicine service for CHF exacerbation.     Elwin Mocha, MD 02/28/12  0109 

## 2012-02-27 NOTE — ED Notes (Signed)
Remains on Bipap, IV ntg started, pt rolled to be cleaned of stool.

## 2012-02-27 NOTE — ED Notes (Signed)
Xray at [facility], CPAP changed to Bipap.

## 2012-02-27 NOTE — ED Notes (Signed)
C/o sudden onset respiritory distress ~ 45 minutes ago, pt arrives on CPAP via EMS, "feeling better" and improved after CPAP, albuterol neb and 1 SL ntg tab. Rales and wheezing reported. Has been 97-100% on O2. L AC NSL in place. Denies CP or other sx. H/o CHF and htn. BP 180/100. Last EMS BP was 150/100. Arrives alert, interactive, answering questions, following commands.

## 2012-02-27 NOTE — Progress Notes (Signed)
Pt came up from ED on current settings and is tolerating well at this time. RT to monitor.

## 2012-02-27 NOTE — H&P (Signed)
PCP:   Thane Edu, MD  Cardiology: [facility] but she have not been seen by them for the past 2 years.    Chief Complaint:   Shortness of breath  HPI: [name] is a 78 y.o. female   has a past medical history of Dementia, unspecified, with behavioral disturbance; Alzheimer's disease; Diabetes mellitus without complication; Chronic kidney disease, stage III (moderate); Unspecified vitamin D deficiency; Arthritis; Hypertension; Hyperlipidemia; Depression; GERD (gastroesophageal reflux disease); Cataract; Chronic kidney disease, stage III (moderate); Exudative senile macular degeneration of retina; Retinal neovascularization NOS; Unspecified systolic heart failure; Unspecified diastolic heart failure; Type II or unspecified type diabetes mellitus with ophthalmic manifestations, not stated as uncontrolled(250.50); Mild nonproliferative diabetic retinopathy(362.04); Atrial fibrillation; Pure hypercholesterolemia; Esophageal reflux; Blood in stool; Primary localized osteoarthrosis, lower leg; Other chronic pain; and Lumbago.   Presented with  She recently had a URI and cough over the weeks end she started to have wheezing by the evening time she started to struggle to breath. EMT gave her a nebulizer treatment which helped somewhat while in ED she was put on BiPAP and given Lasix with good results and now she is much more comfortable.  She is not on oxygen at home. Denies any fevers. Her DM is now diet controlled only.  Patient has history of anemia in the past the base the hemoglobin around 9 she also had history of thrombocytopenia in the past with platelets as well as 90s. She reports past history of GI blood loss. Appear family and patient she does not wish to have any further studies done she is okay with as giving her blood transfusions but does not want any interventions or aggressive management at this point. Family denies any recent history of blood in stool.    Review of  Systems:    Pertinent positives include:shortness of breath at rest. dyspnea on exertion,  productive cough,  Constitutional:  No weight loss, night sweats, Fevers, chills, fatigue, weight loss  HEENT:  No headaches, Difficulty swallowing,Tooth/dental problems,Sore throat,  No sneezing, itching, ear ache, nasal congestion, post nasal drip,  Cardio-vascular:  No chest pain, Orthopnea, PND, anasarca, dizziness, palpitations.no Bilateral lower extremity swelling  GI:  No heartburn, indigestion, abdominal pain, nausea, vomiting, diarrhea, change in bowel habits, loss of appetite, melena, blood in stool, hematemesis Resp:   No coughing up of blood.No change in color of mucus.No wheezing. Skin:  no rash or lesions. No jaundice GU:  no dysuria, change in color of urine, no urgency or frequency. No straining to urinate.  No flank pain.  Musculoskeletal:  No joint pain or no joint swelling. No decreased range of motion. No back pain.  Psych:  No change in mood or affect. No depression or anxiety. No memory loss.  Neuro: no localizing neurological complaints, no tingling, no weakness, no double vision, no gait abnormality, no slurred speech, no confusion  Otherwise ROS are negative except for above, 10 systems were reviewed  Past Medical History: Past Medical History  Diagnosis Date  . Dementia, unspecified, with behavioral disturbance   . Alzheimer's disease   . Diabetes mellitus without complication   . Chronic kidney disease, stage III (moderate)   . Unspecified vitamin D deficiency   . Arthritis   . Hypertension   . Hyperlipidemia   . Depression   . GERD (gastroesophageal reflux disease)   . Cataract   . Chronic kidney disease, stage III (moderate)   . Exudative senile macular degeneration of retina   . Retinal neovascularization  NOS   . Unspecified systolic heart failure   . Unspecified diastolic heart failure   . Type II or unspecified type diabetes mellitus with ophthalmic  manifestations, not stated as uncontrolled(250.50)   . Mild nonproliferative diabetic retinopathy(362.04)   . Atrial fibrillation   . Pure hypercholesterolemia   . Esophageal reflux   . Blood in stool   . Primary localized osteoarthrosis, lower leg   . Other chronic pain   . Lumbago    Past Surgical History  Procedure Date  . Knee surgery   . Appendectomy   . Tonsillectomy      Medications: Prior to Admission medications   Medication Sig Start Date End Date Taking? Authorizing Provider  acetaminophen (TYLENOL) 325 MG tablet Take 650 mg by mouth every 4 (four) hours as needed.   Yes Historical Provider, MD  Albuterol Sulfate (PROAIR HFA IN) Inhale 1 application into the lungs daily as needed. For shortness of breath   Yes Historical Provider, MD  Calcium Carbonate (CALTRATE 600 PO) Take by mouth daily.   Yes Historical Provider, MD  cholecalciferol (VITAMIN D) 1000 UNITS tablet Take 1,000 Units by mouth daily.   Yes Historical Provider, MD  citalopram (CELEXA) 20 MG tablet Take 20 mg by mouth daily.   Yes Historical Provider, MD  furosemide (LASIX) 40 MG tablet Take 40 mg by mouth daily.   Yes Historical Provider, MD  HYDROcodone-acetaminophen (VICODIN) 5-500 MG per tablet Take 1 tablet by mouth every 6 (six) hours. For pain   Yes Historical Provider, MD  isosorbide mononitrate (IMDUR) 30 MG 24 hr tablet Take 30 mg by mouth daily.   Yes Historical Provider, MD  lisinopril (PRINIVIL,ZESTRIL) 10 MG tablet Take 10 mg by mouth daily.   Yes Historical Provider, MD  Loperamide HCl (IMODIUM PO) Take 1 tablet by mouth daily as needed.    Yes Historical Provider, MD  lovastatin (MEVACOR) 40 MG tablet Take 40 mg by mouth at bedtime.   Yes Historical Provider, MD  metoprolol tartrate (LOPRESSOR) 25 MG tablet Take 25 mg by mouth 2 (two) times daily.   Yes Historical Provider, MD  Multiple Vitamins-Minerals (OCUVITE PO) Take by mouth.   Yes Historical Provider, MD  nystatin cream (MYCOSTATIN)  Apply 1 application topically daily as needed. For rash   Yes Historical Provider, MD  omeprazole (PRILOSEC) 20 MG capsule Take 20 mg by mouth daily.   Yes Historical Provider, MD  potassium chloride (K-DUR) 10 MEQ tablet Take 20 mEq by mouth 2 (two) times daily.   Yes Historical Provider, MD  pseudoephedrine (SUDAFED) 120 MG 12 hr tablet Take 120 mg by mouth every 12 (twelve) hours. For congestion   Yes Historical Provider, MD  triamcinolone cream (KENALOG) 0.5 % Apply topically 3 (three) times daily.   Yes Historical Provider, MD    Allergies:   Allergies  Allergen Reactions  . Paroxetine     unknown    Social History:  Ambulatory  walker   Lives at   Home with family   reports that she has quit smoking. She has never used smokeless tobacco. She reports that she does not drink alcohol or use illicit drugs.   Family History: family history includes Cancer in her brother; Heart disease in her father; and Stroke in her mother.    Physical Exam: Patient Vitals for the past 24 hrs:  BP Temp Temp src Pulse Resp SpO2  02/27/12 2139 - - - - - 100 %  02/27/12 2047 130/63 mmHg - -  84  - 100 %  02/27/12 2028 - 100.3 F (37.9 C) Rectal - - -  02/27/12 2021 122/57 mmHg - - 103  22  100 %    1. General:  in No Acute distress, Comfortable on BiPAP  2. Psychological: Alert and   Oriented 3. Head/ENT:   Moist Mucous Membranes                          Head Non traumatic, neck supple                          Normal  Dentition 4. SKIN:   decreased Skin turgor,  Skin clean Dry and intact no rash 5. Heart: Regular rate and rhythm no Murmur, Rub or gallop 6. Lungs:  Some wheezes and crackles  At the bases 7. Abdomen: Soft, non-tender, Non distended 8. Lower extremities: no clubbing, cyanosis, no edema 9. Neurologically Grossly intact, moving all 4 extremities equally 10. MSK: Normal range of motion  body mass index is unknown because there is no height or weight on file.   Labs on  Admission:   [facility] 02/27/12 2029  NA 131*  K 4.5  CL 98  CO2 19  GLUCOSE 187*  BUN 35*  CREATININE 1.07  CALCIUM 8.9  MG --  PHOS --   No results found for this basename: AST:2,ALT:2,ALKPHOS:2,BILITOT:2,PROT:2,ALBUMIN:2 in the last 72 hours No results found for this basename: LIPASE:2,AMYLASE:2 in the last 72 hours  Basename 02/27/12 2029  WBC 3.4*  NEUTROABS --  HGB 7.4*  HCT 22.9*  MCV 90.5  PLT 61*    Basename 02/27/12 2030  CKTOTAL --  CKMB --  CKMBINDEX --  TROPONINI <0.30   No results found for this basename: TSH,T4TOTAL,FREET3,T3FREE,THYROIDAB in the last 72 hours No results found for this basename: VITAMINB12:2,FOLATE:2,FERRITIN:2,TIBC:2,IRON:2,RETICCTPCT:2 in the last 72 hours  The CrCl is unknown because both a height and weight (above a minimum accepted value) are required for this calculation. ABG    Component Value Date/Time   PHART 7.423 02/27/2012 2124   HCO3 20.4 02/27/2012 2124   TCO2 21 02/27/2012 2124   ACIDBASEDEF 4.0* 02/27/2012 2124   O2SAT 100.0 02/27/2012 2124    Other results:  I have pearsonaly reviewed this: ECG REPORT  Rate: 108  Rhythm: LBBB ST&T Change: N/A  BNP 7174  Cultures:    Component Value Date/Time   SDES URINE, RANDOM 08/23/2010 0210   SPECREQUEST NONE 08/23/2010 0210   CULT NO GROWTH 08/23/2010 0210   REPTSTATUS 08/24/2010 FINAL 08/23/2010 0210       Radiological Exams on Admission: Dg Chest Port 1 View  02/27/2012  *RADIOLOGY REPORT*  Clinical Data: Respiratory distress.  PORTABLE CHEST - 1 VIEW  Comparison: 10/11/2011  Findings: Heart size and vascularity are normal.  There are no infiltrates or effusions.  No acute osseous abnormality.  IMPRESSION: No acute disease.   Original Report Authenticated By: Francene Boyers, M.D.     Chart has been reviewed  Assessment/Plan  Patient is an 78 year old female with multiple medical problems including history of heart failure, COPD, anemia. Presented with sudden  onset of acute respiratory distress likely multifactorial.  Present on Admission:  . DYSPNEA - acute respiratory distress multifactorial in origin combination of COPD exacerbation likely CHF exacerbation as well as anemia .currently improved after being on the BiPAP. She continues to be comfortable on the BiPAP will keep overnight admit to step  down. I think patient would tolerate being weaned off in the morning or even sooner.  . DM - usually diet controlled but given that she'll likely receive steroids for COPD will order sliding scale and monitor blood sugars carefully and  . HYPERTENSION - continue home medications  . CONGESTIVE HEART FAILURE - admit procedure protocol obtain echo gram, cycle cardiac enzymes obtain serial EKG gentle diuresis in the past she has been over diuresed and in dumping dehydrated she received 40 IV of Lasix in he'll arm for now will increase her home dose to 40 by mouth twice a day and watch carefully to see if she needs any additional diuresis  . HYPERLIPIDEMIA - continue home medications  Anemia - this is chronic and recurrent CVA which is likely contributing to her shortness of breath we'll transfuse 1 unit Hemoccult stool, obtain anemia panel patient is not interested in father and mastication she does not wish to have any procedures or GI consult. At this point there is no overt sign of bleeding.  Thrombocytopenia  - patient has history of thrombocytopenia in the past but currently is worsening. She does not wish to have any aggressive investigation regarding this. She is currently not actively bleeding. Will repeat and monitor carefully.   Prophylaxis:  Lovenox, Protonix  CODE STATUS: DNR/ DNI as per patient and family, avoid aggressive interventions. BiPAP is ok.   Other plan as per orders.  I have spent a total of 60 min on this admission  Nasirah Sachs 02/27/2012, 10:36 PM

## 2012-02-27 NOTE — ED Notes (Signed)
Admitting MD at bedside.

## 2012-02-27 NOTE — ED Notes (Signed)
CPAP continues, RT & EDP at [facility], "improved" per EMS, alert, NAD, calm, interactive, resps w/ CPAP, distressed when mask removed for exam,  expiritory wheezing, one word responses.

## 2012-02-28 ENCOUNTER — Encounter (HOSPITAL_COMMUNITY): Payer: Self-pay | Admitting: Internal Medicine

## 2012-02-28 DIAGNOSIS — Z22322 Carrier or suspected carrier of Methicillin resistant Staphylococcus aureus: Secondary | ICD-10-CM

## 2012-02-28 DIAGNOSIS — D509 Iron deficiency anemia, unspecified: Secondary | ICD-10-CM | POA: Diagnosis present

## 2012-02-28 HISTORY — DX: Iron deficiency anemia, unspecified: D50.9

## 2012-02-28 LAB — RETICULOCYTES
RBC.: 2.35 MIL/uL — ABNORMAL LOW (ref 3.87–5.11)
Retic Count, Absolute: 40 10*3/uL (ref 19.0–186.0)
Retic Ct Pct: 1.7 % (ref 0.4–3.1)

## 2012-02-28 LAB — TROPONIN I
Troponin I: <0.3 ng/mL (ref <0.30)
Troponin I: <0.3 ng/mL (ref <0.30)

## 2012-02-28 LAB — CBC
HCT: 24.9 % — ABNORMAL LOW (ref 36.0–46.0)
Hemoglobin: 7.8 g/dL — ABNORMAL LOW (ref 12.0–15.0)
MCH: 29.1 pg (ref 26.0–34.0)
MCH: 29.3 pg (ref 26.0–34.0)
MCHC: 33.3 g/dL (ref 30.0–36.0)
MCHC: 33.5 g/dL (ref 30.0–36.0)
MCV: 87.4 fL (ref 78.0–100.0)
Platelets: 61 10*3/uL — ABNORMAL LOW (ref 150–400)
RDW: 13.6 % (ref 11.5–15.5)
RDW: 13.7 % (ref 11.5–15.5)

## 2012-02-28 LAB — DIFFERENTIAL
Eosinophils Absolute: 0 10*3/uL (ref 0.0–0.7)
Eosinophils Relative: 0 % (ref 0–5)
Lymphocytes Relative: 23 % (ref 12–46)
Lymphs Abs: 0.3 10*3/uL — ABNORMAL LOW (ref 0.7–4.0)
Monocytes Absolute: 0.1 10*3/uL (ref 0.1–1.0)
Monocytes Relative: 4 % (ref 3–12)

## 2012-02-28 LAB — COMPREHENSIVE METABOLIC PANEL
ALT: 14 U/L (ref 0–35)
AST: 34 U/L (ref 0–37)
Alkaline Phosphatase: 71 U/L (ref 39–117)
CO2: 21 mEq/L (ref 19–32)
Calcium: 8.9 mg/dL (ref 8.4–10.5)
GFR calc Af Amer: 53 mL/min — ABNORMAL LOW (ref >90)
Glucose, Bld: 254 mg/dL — ABNORMAL HIGH (ref 70–99)
Potassium: 4.9 mEq/L (ref 3.5–5.1)
Sodium: 133 mEq/L — ABNORMAL LOW (ref 135–145)
Total Protein: 6.3 g/dL (ref 6.0–8.3)

## 2012-02-28 LAB — GLUCOSE, CAPILLARY
Glucose-Capillary: 231 mg/dL — ABNORMAL HIGH (ref 70–99)
Glucose-Capillary: 202 mg/dL — ABNORMAL HIGH (ref 70–99)
Glucose-Capillary: 176 mg/dL — ABNORMAL HIGH (ref 70–99)
Glucose-Capillary: 209 mg/dL — ABNORMAL HIGH (ref 70–99)

## 2012-02-28 LAB — MAGNESIUM: Magnesium: 2 mg/dL (ref 1.5–2.5)

## 2012-02-28 LAB — TECHNOLOGIST SMEAR REVIEW

## 2012-02-28 LAB — FERRITIN: Ferritin: 90 ng/mL (ref 10–291)

## 2012-02-28 LAB — IRON AND TIBC: UIBC: 271 ug/dL (ref 125–400)

## 2012-02-28 LAB — CREATININE, URINE, RANDOM: Creatinine, Urine: 28.05 mg/dL

## 2012-02-28 LAB — SODIUM, URINE, RANDOM: Sodium, Ur: 36 mEq/L

## 2012-02-28 LAB — HEMOGLOBIN A1C: Hgb A1c MFr Bld: 6.3 % — ABNORMAL HIGH (ref <5.7)

## 2012-02-28 LAB — OSMOLALITY, URINE: Osmolality, Ur: 295 mOsm/kg — ABNORMAL LOW (ref 390–1090)

## 2012-02-28 LAB — TSH: TSH: 0.516 u[IU]/mL (ref 0.350–4.500)

## 2012-02-28 MED ORDER — SODIUM CHLORIDE 0.9 % IV BOLUS (SEPSIS): 250.0000 mL | Freq: Once | INTRAVENOUS | Status: DC

## 2012-02-28 MED ORDER — MUPIROCIN 2 % EX OINT
1.0000 "application " | TOPICAL_OINTMENT | Freq: Two times a day (BID) | CUTANEOUS | Status: DC
  Administered 2012-02-28 – 2012-03-03 (×9): 1 via NASAL
  Filled 2012-02-28: qty 22

## 2012-02-28 MED ORDER — PREDNISONE 50 MG PO TABS
60.0000 mg | ORAL_TABLET | Freq: Every day | ORAL | Status: AC
  Administered 2012-02-28 – 2012-02-29 (×2): 60 mg via ORAL
  Filled 2012-02-28 (×2): qty 1

## 2012-02-28 MED ORDER — PANTOPRAZOLE SODIUM 40 MG IV SOLR
40.0000 mg | Freq: Two times a day (BID) | INTRAVENOUS | Status: DC
  Administered 2012-02-28 – 2012-03-01 (×4): 40 mg via INTRAVENOUS
  Filled 2012-02-28 (×6): qty 40

## 2012-02-28 MED ORDER — BOOST PLUS PO LIQD
237.0000 mL | Freq: Two times a day (BID) | ORAL | Status: DC
  Administered 2012-02-28 – 2012-02-29 (×2): 237 mL via ORAL
  Filled 2012-02-28 (×6): qty 237

## 2012-02-28 MED ORDER — CHLORHEXIDINE GLUCONATE CLOTH 2 % EX PADS
6.0000 | MEDICATED_PAD | Freq: Every day | CUTANEOUS | Status: AC
  Administered 2012-02-28 – 2012-03-03 (×5): 6 via TOPICAL

## 2012-02-28 MED ORDER — BIOTENE DRY MOUTH MT LIQD
15.0000 mL | Freq: Two times a day (BID) | OROMUCOSAL | Status: DC
  Administered 2012-02-28 – 2012-03-03 (×9): 15 mL via OROMUCOSAL

## 2012-02-28 MED ORDER — DOXYCYCLINE HYCLATE 100 MG PO TABS
100.0000 mg | ORAL_TABLET | Freq: Two times a day (BID) | ORAL | Status: DC
  Administered 2012-02-28 – 2012-03-03 (×9): 100 mg via ORAL
  Filled 2012-02-28 (×12): qty 1

## 2012-02-28 NOTE — Progress Notes (Signed)
Pt. Taken off bipap by RN with MD permission. Pt. Is currently tolerating nasal cannula at 3L.

## 2012-02-28 NOTE — Consult Note (Signed)
Subjective:   HPI  The patient is an 78 year old female who has been noticing dark-colored stools for a while but cannot tell me exactly how long. She was found to be anemic. She was transfused one unit of blood. In looking back at her records she had a colonoscopy a few years ago which was incomplete to the cecum because of the tortuous colon, and this was followed up with a barium enema showing a tortuous colon. She denies vomiting blood. She denies abdominal pain.  Review of Systems No complaints of chest pain or shortness of breath  Past Medical History  Diagnosis Date  . Dementia, unspecified, with behavioral disturbance   . Alzheimer's disease   . Diabetes mellitus without complication   . Chronic kidney disease, stage III (moderate)   . Unspecified vitamin D deficiency   . Arthritis     osteo  . Hypertension   . Hyperlipidemia   . Depression   . GERD (gastroesophageal reflux disease)   . Cataract   . Exudative senile macular degeneration of retina   . Retinal neovascularization NOS   . Unspecified systolic heart failure   . Mild nonproliferative diabetic retinopathy(362.04)   . Atrial fibrillation   . Pure hypercholesterolemia   . Esophageal reflux   . Blood in stool   . Primary localized osteoarthrosis, lower leg   . Other chronic pain   . Lumbago   . Vitamin D deficiency   . Macular degeneration   . Chronic pain     back   Past Surgical History  Procedure Date  . Knee surgery   . Appendectomy   . Tonsillectomy    History   Social History  . Marital Status: Widowed    Spouse Name: N/A    Number of Children: N/A  . Years of Education: N/A   Occupational History  . Not on file.   Social History Main Topics  . Smoking status: Former Games developer  . Smokeless tobacco: Never Used  . Alcohol Use: No  . Drug Use: No  . Sexually Active: Not on file   Other Topics Concern  . Not on file   Social History Narrative  . No narrative on file   family history  includes Cancer in her brother; Heart disease in her father; and Stroke in her mother. Current facility-administered medications:0.9 %  sodium chloride infusion, 500 mL, Intravenous, Once, Anastassia Doutova, MD;  0.9 %  sodium chloride infusion, 250 mL, Intravenous, PRN, Therisa Doyne, MD;  acetaminophen (TYLENOL) suppository 650 mg, 650 mg, Rectal, Q6H PRN, Therisa Doyne, MD;  acetaminophen (TYLENOL) tablet 650 mg, 650 mg, Oral, Q6H PRN, Therisa Doyne, MD albuterol (PROVENTIL HFA;VENTOLIN HFA) 108 (90 BASE) MCG/ACT inhaler 2 puff, 2 puff, Inhalation, Q4H PRN, Therisa Doyne, MD;  albuterol (PROVENTIL) (5 MG/ML) 0.5% nebulizer solution 2.5 mg, 2.5 mg, Nebulization, Q2H PRN, Therisa Doyne, MD, 2.5 mg at 02/28/12 0112;  antiseptic oral rinse (BIOTENE) solution 15 mL, 15 mL, Mouth Rinse, BID, Therisa Doyne, MD, 15 mL at 02/28/12 0800 Chlorhexidine Gluconate Cloth 2 % PADS 6 each, 6 each, Topical, Q0600, Therisa Doyne, MD, 6 each at 02/28/12 1018;  citalopram (CELEXA) tablet 20 mg, 20 mg, Oral, Daily, Therisa Doyne, MD, 20 mg at 02/28/12 1018;  docusate sodium (COLACE) capsule 100 mg, 100 mg, Oral, BID, Therisa Doyne, MD, 100 mg at 02/28/12 1018;  doxycycline (VIBRA-TABS) tablet 100 mg, 100 mg, Oral, Q12H, Neema K Sharda, MD, 100 mg at 02/28/12 1249 furosemide (LASIX) tablet 40 mg, 40  mg, Oral, BID, Therisa Doyne, MD, 40 mg at 02/28/12 0748;  guaiFENesin (MUCINEX) 12 hr tablet 600 mg, 600 mg, Oral, BID, Therisa Doyne, MD, 600 mg at 02/28/12 1018;  HYDROcodone-acetaminophen (NORCO/VICODIN) 5-325 MG per tablet 1 tablet, 1 tablet, Oral, Q6H PRN, Therisa Doyne, MD, 1 tablet at 02/28/12 1414 insulin aspart (novoLOG) injection 0-5 Units, 0-5 Units, Subcutaneous, QHS, Therisa Doyne, MD, 2 Units at 02/28/12 0138;  insulin aspart (novoLOG) injection 0-9 Units, 0-9 Units, Subcutaneous, TID WC, Therisa Doyne, MD, 3 Units at 02/28/12 1250;  ipratropium  (ATROVENT) nebulizer solution 0.5 mg, 0.5 mg, Nebulization, Q6H, Therisa Doyne, MD, 0.5 mg at 02/28/12 1349 isosorbide mononitrate (IMDUR) 24 hr tablet 30 mg, 30 mg, Oral, Daily, Therisa Doyne, MD, 30 mg at 02/28/12 1017;  lactose free nutrition (BOOST PLUS) liquid 237 mL, 237 mL, Oral, BID BM, Annett Gula, MD;  lisinopril (PRINIVIL,ZESTRIL) tablet 10 mg, 10 mg, Oral, Daily, Therisa Doyne, MD, 10 mg at 02/28/12 1017;  metoprolol tartrate (LOPRESSOR) tablet 25 mg, 25 mg, Oral, BID, Therisa Doyne, MD, 25 mg at 02/28/12 1017 mupirocin ointment (BACTROBAN) 2 % 1 application, 1 application, Nasal, BID, Therisa Doyne, MD, 1 application at 02/28/12 1017;  ondansetron (ZOFRAN) injection 4 mg, 4 mg, Intravenous, Q6H PRN, Therisa Doyne, MD;  ondansetron (ZOFRAN) tablet 4 mg, 4 mg, Oral, Q6H PRN, Therisa Doyne, MD;  pantoprazole (PROTONIX) injection 40 mg, 40 mg, Intravenous, Q12H, Neema Davina Poke, MD predniSONE (DELTASONE) tablet 60 mg, 60 mg, Oral, Q breakfast, Annett Gula, MD, 60 mg at 02/28/12 1249;  simvastatin (ZOCOR) tablet 20 mg, 20 mg, Oral, q1800, Therisa Doyne, MD;  sodium chloride 0.9 % bolus 250 mL, 250 mL, Intravenous, Once, Therisa Doyne, MD;  sodium chloride 0.9 % injection 3 mL, 3 mL, Intravenous, Q12H, Anastassia Doutova, MD;  sodium chloride 0.9 % injection 3 mL, 3 mL, Intravenous, PRN, Therisa Doyne, MD sodium chloride 0.9 % injection 3 mL, 3 mL, Intravenous, Q12H, Therisa Doyne, MD, 3 mL at 02/28/12 1019;  sodium chloride 0.9 % injection 3 mL, 3 mL, Intravenous, PRN, Therisa Doyne, MD Allergies  Allergen Reactions  . Paroxetine     unknown     Objective:     BP 111/66  Pulse 88  Temp 97.9 F (36.6 C) (Oral)  Resp 20  Ht 5\' 1"  (1.549 m)  Wt 71.9 kg (158 lb 8.2 oz)  BMI 29.95 kg/m2  SpO2 99%  She is in no distress  Heart regular rhythm no murmurs  Lungs clear  Abdomen is soft and nontender  Laboratory No  components found with this basename: d1      Assessment:     #1. Anemia  #2. Melena      Plan:     I spoke to the patient and to her daughter-in-law who is her power of attorney in regards to further evaluation. At this time French Ana her daughter-in-law and who is her power of attorney is going to talk to the patient's son to see if they want to proceed with any further testing. They have previously expressed that they did not want invasive procedures. I talked to them about EGD. She will decide and get back to me. In the meanwhile we will watch for further evidence of bleeding and monitor H&H Lab Results  Component Value Date   HGB 8.3* 02/28/2012   HGB 7.4* 02/27/2012   HGB 9.1* 08/28/2010   HCT 24.9* 02/28/2012   HCT 22.9* 02/27/2012   HCT 26.6* 08/28/2010  ALKPHOS 71 02/28/2012   ALKPHOS 84 08/23/2010   ALKPHOS 48 04/30/2010   AST 34 02/28/2012   AST 43* 08/23/2010   AST 50* 04/30/2010   ALT 14 02/28/2012   ALT 25 08/23/2010   ALT 18 04/30/2010

## 2012-02-28 NOTE — Care Management Note (Signed)
    Page 1 of 1   02/28/2012     10:24:35 AM   CARE MANAGEMENT NOTE 02/28/2012  Patient:  [name], [name]   Account Number:  [id number]  Date Initiated:  02/28/2012  Documentation initiated by:  Junius Creamer  Subjective/Objective Assessment:   adm w anemia, chf     Action/Plan:   lives w son and da in Social worker, uses walker. pcp dr Marny Lowenstein   Anticipated DC Date:     Anticipated DC Plan:        DC Planning Services  CM consult      Choice offered to / List presented to:             Status of service:   Medicare Important Message given?   (If response is "NO", the following Medicare IM given date fields will be blank) Date Medicare IM given:   Date Additional Medicare IM given:    Discharge Disposition:    Per UR Regulation:  Reviewed for med. necessity/level of care/duration of stay  If discussed at Long Length of Stay Meetings, dates discussed:    Comments:  12/16 10:22a debbie Yeila Morro rn,bsn [phone] will moniter for dc needs as pt progresses.hx of [facility] w [facility].

## 2012-02-28 NOTE — Progress Notes (Signed)
Patient's last hemoglobin level was 7.8.  MD notified.  RN will continue to monitor the patient.

## 2012-02-28 NOTE — Progress Notes (Addendum)
Subjective: Patient states she had cough prior to admission with yellow red sputum x 2 weeks.  She has had a cough before.  She has been exposed to sick contacts who she lives with her son and daughter in law.  She lives with them in [place].  She states her temp prior to admission was 100.3, denies chills. She has had the flu shot this year.  She states her sob improved after Lasix.  She has intermittent wheezing at home and states the wheezing has decreased.  She is not on home oxygen.  She feels a lot better since admission.  She admits to using inhalers at home though she cant think of the name.  She denies being diagnosed with COPD. She has a history of smoking x 50 years up to 1 ppd but quit 2 years ago.  She uses a walker at home but still stumbles.  She is hard of hearing.  She reports tarry stool prior to admission  She denies ab pain though she states she has a history of diverticulosis  She reports recently having loose stools.  She has urinary and stool incontinence.   Objective: Vital signs in last 24 hours: Filed Vitals:   02/28/12 0345 02/28/12 0400 02/28/12 0821 02/28/12 0844  BP: 115/48 118/50 124/48   Pulse: 85 83 65   Temp:  97.8 F (36.6 C) 97 F (36.1 C)   TempSrc:  Oral Oral   Resp: 23 31 24    Height:      Weight:      SpO2:   99% 100%   Weight change:   Intake/Output Summary (Last 24 hours) at 02/28/12 0946 Last data filed at 02/28/12 0600  Gross per 24 hour  Intake    378 ml  Output   1350 ml  Net   -972 ml   General: lying in bed, nad, alert and oriented x 3 HEENT: Ballou/at CV: RRR no rubs, murmurs, gallops Lungs: on 3.5 L weaned down to 0 while in the room no respiratory distress.  Wheezing b/l Abdomen: +mild epigastric ttp, soft, normal bs, other areas no ttp, nd Extremities: warm, no cyanosis or edema Neuro: CN 2-12 grossly intact, moving all 4 extremities  Lab Results: Basic Metabolic Panel:  Lab 02/28/12 0865 02/27/12 2029  NA 133* 131*  K 4.9  4.5  CL 101 98  CO2 21 19  GLUCOSE 254* 187*  BUN 37* 35*  CREATININE 1.10 1.07  CALCIUM 8.9 8.9  MG 2.0 --  PHOS 3.7 --   Liver Function Tests:  Lab 02/28/12 0600  AST 34  ALT 14  ALKPHOS 71  BILITOT 0.9  PROT 6.3  ALBUMIN 3.0*   CBC:  Lab 02/27/12 2029  WBC 3.4*  NEUTROABS --  HGB 7.4*  HCT 22.9*  MCV 90.5  PLT 61*   Cardiac Enzymes:  Lab 02/28/12 0600 02/27/12 2030  CKTOTAL -- --  CKMB -- --  CKMBINDEX -- --  TROPONINI <0.30 <0.30   BNP:  Lab 02/27/12 2030  PROBNP 7174.0*   CBG:  Lab 02/28/12 0746 02/28/12 0123  GLUCAP 231* 224*   Coagulation:  Lab 02/27/12 2029  LABPROT 15.2  INR 1.22   Anemia Panel:  Lab 02/28/12 0029  VITAMINB12 --  FOLATE --  FERRITIN --  TIBC --  IRON --  RETICCTPCT 1.7   Misc. Labs: anemia panel, fobt  Micro Results: Recent Results (from the past 240 hour(s))  MRSA PCR SCREENING     Status:  Abnormal   Collection Time   02/27/12 11:38 PM      Component Value Range Status Comment   MRSA by PCR POSITIVE (*) NEGATIVE Final    Studies/Results: Dg Chest Port 1 View  02/27/2012  *RADIOLOGY REPORT*  Clinical Data: Respiratory distress.  PORTABLE CHEST - 1 VIEW  Comparison: 10/11/2011  Findings: Heart size and vascularity are normal.  There are no infiltrates or effusions.  No acute osseous abnormality.  IMPRESSION: No acute disease.   Original Report Authenticated By: Francene Boyers, M.D.    Medications: Scheduled Meds:    . sodium chloride  500 mL Intravenous Once  . antiseptic oral rinse  15 mL Mouth Rinse BID  . Chlorhexidine Gluconate Cloth  6 each Topical Q0600  . citalopram  20 mg Oral Daily  . docusate sodium  100 mg Oral BID  . furosemide  40 mg Oral BID  . guaiFENesin  600 mg Oral BID  . insulin aspart  0-5 Units Subcutaneous QHS  . insulin aspart  0-9 Units Subcutaneous TID WC  . ipratropium  0.5 mg Nebulization Q6H  . isosorbide mononitrate  30 mg Oral Daily  . lisinopril  10 mg Oral Daily  .  methylPREDNISolone (SOLU-MEDROL) injection  60 mg Intravenous Q24H  . metoprolol tartrate  25 mg Oral BID  . mupirocin ointment  1 application Nasal BID  . pantoprazole  40 mg Oral Daily  . simvastatin  20 mg Oral q1800  . sodium chloride  250 mL Intravenous Once  . sodium chloride  3 mL Intravenous Q12H  . sodium chloride  3 mL Intravenous Q12H   Continuous Infusions:  PRN Meds:.sodium chloride, acetaminophen, acetaminophen, albuterol, albuterol, HYDROcodone-acetaminophen, ondansetron (ZOFRAN) IV, ondansetron, sodium chloride, sodium chloride Assessment/Plan: 1.Possible acute on chronic systolic and diastolic heart failure -dyspnea improved with diuresis, BNP 7174.0. CXR no acute disease. CE neg x 3. Elevated BUN 37/1.10 Creatinine -strict i/o, daily wts  -Continue diuresis with Lasix 40 mg bid home dose was bid as well.  She has been iven 40 mg iv x 1 adn 20 mg iv x 1 -pending echo  2. DYSPNEA  -etiology thought to be secondary to acute on chronic systolic and diastolic failure versus pulmonary (i.e COPD exacerbation but no prior pfts on file)  -Improved. AG 11 on admission with ABG 7.423/31.3/233.0/19 -12/2009 echo with systolic function mild to moderately reduced 40-45%, diffuse hypokinesis, grade 2 diastolic dysfuntion -previously given Albuterol neb, Continue Atrovent neb q 6 hours, prn Albuterol q4 prn inhaler and Albuterol nebulizer q2 prn -bipap prn -will added Doxycycline 100 mg bid for possible COPD exacerbation -transitioned off Solumedrol to Prednisone 60 mg x 2 days and will consider taper  -Consider pft evaluation outpatient   3. Microcytic Anemia and pancytopenia  -Hemoglobin 7.4 on admission with MCV 90.5; s/p 1 unit pRBCs -patient has had GI procedure in the past she is unsure where [facility] consulted to help ID source appreciate recs, no colonoscopy on file-procedures on hold until GI speaks with patient's son -pending fobt, anemia panel  -will change Protonix 40  mg qd to 40 mg bid iv  -pending post transfusion CBC -pending SPEP/UPEP to w/u pancytopenia for MDS.  Spoke with H/O.  Will discuss with patient whether she wants to have a bone marrow biopsy or further w/u in the future for pancytopenia   4. DM  -pending HA1C -monitor cbg, fsbs may be elevated 2/2 solumedrol given  -continue SSI   5. HYPERTENSION  -continue to  monitor BP. Continue Imdur, Lisinopril, Lopressor   6.MRSA nares -protocol  7. Chronic thrombocytopenia -plts 61 this admission.  Baseline low 100s   8. F/E/N -will monitor labs -diet ordered   9. DVT Px -scds     Dispo: Disposition is deferred at this time, awaiting improvement of current medical problems.  Anticipated discharge in approximately 1-2 day(s).   The patient does have a current PCP (Thane Edu, MD), therefore will be requiring OPC follow-up after discharge.   The patient does have transportation limitations that hinder transportation to clinic appointments.  .Services Needed at time of discharge: Y = Yes, Blank = No PT:   OT:   RN:   Equipment:   Other:     LOS: 1 day   Annett Gula [phone] 02/28/2012, 9:46 AM

## 2012-02-28 NOTE — Progress Notes (Signed)
*  PRELIMINARY RESULTS* Echocardiogram 2D Echocardiogram has been performed.  [name] 02/28/2012, 11:24 AM

## 2012-02-29 ENCOUNTER — Inpatient Hospital Stay (HOSPITAL_COMMUNITY): Payer: Medicare (Managed Care)

## 2012-02-29 DIAGNOSIS — I5023 Acute on chronic systolic (congestive) heart failure: Secondary | ICD-10-CM | POA: Diagnosis present

## 2012-02-29 DIAGNOSIS — D61818 Other pancytopenia: Secondary | ICD-10-CM | POA: Diagnosis present

## 2012-02-29 DIAGNOSIS — J441 Chronic obstructive pulmonary disease with (acute) exacerbation: Secondary | ICD-10-CM | POA: Diagnosis present

## 2012-02-29 DIAGNOSIS — Z8679 Personal history of other diseases of the circulatory system: Secondary | ICD-10-CM

## 2012-02-29 DIAGNOSIS — I255 Ischemic cardiomyopathy: Secondary | ICD-10-CM | POA: Insufficient documentation

## 2012-02-29 DIAGNOSIS — E871 Hypo-osmolality and hyponatremia: Secondary | ICD-10-CM | POA: Insufficient documentation

## 2012-02-29 LAB — DIFFERENTIAL
Basophils Absolute: 0 10*3/uL (ref 0.0–0.1)
Basophils Relative: 0 % (ref 0–1)
Eosinophils Absolute: 0 10*3/uL (ref 0.0–0.7)
Lymphocytes Relative: 8 % — ABNORMAL LOW (ref 12–46)
Monocytes Relative: 7 % (ref 3–12)
Neutro Abs: 5.6 10*3/uL (ref 1.7–7.7)
Neutrophils Relative %: 85 % — ABNORMAL HIGH (ref 43–77)

## 2012-02-29 LAB — CBC
Hemoglobin: 7.8 g/dL — ABNORMAL LOW (ref 12.0–15.0)
Hemoglobin: 8.5 g/dL — ABNORMAL LOW (ref 12.0–15.0)
MCH: 29.1 pg (ref 26.0–34.0)
MCH: 29.4 pg (ref 26.0–34.0)
MCHC: 32.9 g/dL (ref 30.0–36.0)
MCHC: 33.7 g/dL (ref 30.0–36.0)
MCV: 88.2 fL (ref 78.0–100.0)
Platelets: 95 10*3/uL — ABNORMAL LOW (ref 150–400)
Platelets: 87 10*3/uL — ABNORMAL LOW (ref 150–400)
RBC: 2.89 MIL/uL — ABNORMAL LOW (ref 3.87–5.11)
RDW: 13.8 % (ref 11.5–15.5)
RDW: 14 % (ref 11.5–15.5)
WBC: 8.8 10*3/uL (ref 4.0–10.5)
WBC: 5.4 10*3/uL (ref 4.0–10.5)

## 2012-02-29 LAB — BASIC METABOLIC PANEL
CO2: 21 mEq/L (ref 19–32)
Calcium: 8.6 mg/dL (ref 8.4–10.5)
Glucose, Bld: 179 mg/dL — ABNORMAL HIGH (ref 70–99)
Potassium: 4.1 mEq/L (ref 3.5–5.1)
Sodium: 130 mEq/L — ABNORMAL LOW (ref 135–145)

## 2012-02-29 LAB — GLUCOSE, CAPILLARY
Glucose-Capillary: 214 mg/dL — ABNORMAL HIGH (ref 70–99)
Glucose-Capillary: 328 mg/dL — ABNORMAL HIGH (ref 70–99)

## 2012-02-29 MED ORDER — FUROSEMIDE 40 MG PO TABS: 40.0000 mg | ORAL_TABLET | Freq: Every day | ORAL | Status: DC

## 2012-02-29 MED ORDER — METHYLPREDNISOLONE SODIUM SUCC 125 MG IJ SOLR
60.0000 mg | Freq: Four times a day (QID) | INTRAMUSCULAR | Status: DC
  Administered 2012-02-29 – 2012-03-01 (×4): 60 mg via INTRAVENOUS
  Filled 2012-02-29 (×8): qty 0.96

## 2012-02-29 MED ORDER — TRAZODONE HCL 50 MG PO TABS
50.0000 mg | ORAL_TABLET | Freq: Every evening | ORAL | Status: DC | PRN
  Administered 2012-03-01 – 2012-03-03 (×3): 50 mg via ORAL
  Filled 2012-02-29 (×3): qty 1

## 2012-02-29 MED ORDER — ENSURE PUDDING PO PUDG: 1.0000 | Freq: Three times a day (TID) | ORAL | Status: DC

## 2012-02-29 MED ORDER — ENSURE COMPLETE PO LIQD
237.0000 mL | Freq: Two times a day (BID) | ORAL | Status: DC
  Administered 2012-02-29 – 2012-03-02 (×5): 237 mL via ORAL

## 2012-02-29 MED ORDER — ALBUTEROL SULFATE (5 MG/ML) 0.5% IN NEBU
2.5000 mg | INHALATION_SOLUTION | Freq: Three times a day (TID) | RESPIRATORY_TRACT | Status: DC
  Administered 2012-02-29 – 2012-03-03 (×10): 2.5 mg via RESPIRATORY_TRACT
  Filled 2012-02-29 (×11): qty 0.5

## 2012-02-29 MED ORDER — IPRATROPIUM BROMIDE 0.02 % IN SOLN
0.5000 mg | Freq: Three times a day (TID) | RESPIRATORY_TRACT | Status: DC
  Administered 2012-02-29 – 2012-03-03 (×10): 0.5 mg via RESPIRATORY_TRACT
  Filled 2012-02-29 (×11): qty 2.5

## 2012-02-29 MED ORDER — FUROSEMIDE 10 MG/ML IJ SOLN
20.0000 mg | Freq: Once | INTRAMUSCULAR | Status: AC
  Administered 2012-02-29: 20 mg via INTRAVENOUS
  Filled 2012-02-29: qty 2

## 2012-02-29 MED ORDER — FUROSEMIDE 40 MG PO TABS: 40.0000 mg | ORAL_TABLET | Freq: Two times a day (BID) | ORAL | Status: DC

## 2012-02-29 NOTE — H&P (Signed)
Internal Medicine Teaching Service Attending Note Date: 02/29/2012  Patient name: [name]  Medical record number: [id number]  Date of birth: 07/06/1929   Chief Complaint: Shortness of breath . History of Present Illness The patient, [name], is a 78 y.o. year old female, with past medical history of Diabetes, CKD, hypertension and , who comes in with the chief complaint of shortness of breath. I have read the history documented by Dr.Doutova and I concur with the chronology of events.   When I met with the patient today, she was very short of breath and could not speak properly because of that. She is extremely hard of hearing. She has been a smoker. I do not see any documented COPD history, however she has some 50+ years of smoking thus I would presume that diagnosis in this patient. The patient is audibly wheezing when I see her. She says this has been going on since she had URI with cough since the last couple of weeks. She denies fever or chills. In the ED she was given BIPAP and lasix and she felt comfortable. She admits to having some dark tarry stools over the past couple of weeks. She denies chest pain or palpitations, however she is dyspneic on exertion. Her son is the POA because she has some history of dementia. However when I talked to the patient she sounded with it, alert and oriented, and making sense.   Past Medical History   has a past medical history of Dementia, unspecified, with behavioral disturbance; Alzheimer's disease; Diabetes mellitus without complication; Chronic kidney disease, stage III (moderate); Unspecified vitamin D deficiency; Arthritis; Hypertension; Hyperlipidemia; Depression; GERD (gastroesophageal reflux disease); Cataract; Exudative senile macular degeneration of retina; Retinal neovascularization NOS; Unspecified systolic heart failure; Mild nonproliferative diabetic retinopathy(362.04); Atrial fibrillation; Pure hypercholesterolemia; Esophageal  reflux; Blood in stool; Primary localized osteoarthrosis, lower leg; Other chronic pain; Lumbago; Vitamin D deficiency; Macular degeneration; and Chronic pain.  Medications  Reviewed  Family History family history includes Cancer in her brother; Heart disease in her father; and Stroke in her mother.  Social History  reports that she has quit smoking. She has never used smokeless tobacco. She reports that she does not drink alcohol or use illicit drugs.  Review of Systems Positive for - cough, shortness of breath, exertional dyspnea, black tarry stools Negative for - fever, chills, chest pain, urinary complaints, palpitations.  Vital Signs: Filed Vitals:   02/29/12 0940 02/29/12 1249 02/29/12 1325 02/29/12 1416  BP: 140/73 137/94 134/75 129/68  Pulse: 82 85 82 102  Temp: 98.5 F (36.9 C) 98.1 F (36.7 C) 97.9 F (36.6 C) 98.3 F (36.8 C)  TempSrc: Oral Oral Oral Oral  Resp: 20 26 24 24   Height:      Weight:      SpO2: 98% 99%  97%    Physical Exam:  I met with patient around 12 noon today  Vitals reviewed.  General: Resting in bed. Short of breath, effort in talking and moving around in bed.  HEENT: PERRL, EOMI, no scleral icterus. Heart: RRR, no rubs, murmurs or gallops. Lungs: Externally audible wheezes bilaterally Abdomen: Soft, nontender, nondistended, BS present. Extremities: Warm, no pedal edema. Neuro: Alert and oriented X3, cranial nerves II-XII grossly intact,  strength and sensation to light touch equal in bilateral upper and lower extremities  Lab results: CMP     Component Value Date/Time   NA 130* 02/29/2012 0312   K 4.1 02/29/2012 0312   CL 95* 02/29/2012  0312   CO2 21 02/29/2012 0312   GLUCOSE 179* 02/29/2012 0312   BUN 55* 02/29/2012 0312   CREATININE 1.30* 02/29/2012 0312   CALCIUM 8.6 02/29/2012 0312   PROT 6.3 02/28/2012 0600   ALBUMIN 3.0* 02/28/2012 0600   AST 34 02/28/2012 0600   ALT 14 02/28/2012 0600   ALKPHOS 71 02/28/2012 0600    BILITOT 0.9 02/28/2012 0600   GFRNONAA 38* 02/29/2012 0312   GFRAA 44* 02/29/2012 0312   CBC    Component Value Date/Time   WBC 8.8 02/29/2012 1143   RBC 2.89* 02/29/2012 1143   HGB 8.5* 02/29/2012 1143   HCT 25.5* 02/29/2012 1143   PLT 95* 02/29/2012 1143   MCV 88.2 02/29/2012 1143   MCH 29.4 02/29/2012 1143   MCHC 33.3 02/29/2012 1143   RDW 13.8 02/29/2012 1143   LYMPHSABS 0.5* 02/29/2012 0312   MONOABS 0.5 02/29/2012 0312   EOSABS 0.0 02/29/2012 0312   BASOSABS 0.0 02/29/2012 0312     Lab 02/28/12 1543 02/28/12 0915 02/28/12 0600 02/27/12 2030  TROPONINI <0.30 <0.30 <0.30 <0.30    Lab 02/27/12 2030  PROBNP 7174.0*    Urinalysis    Component Value Date/Time   COLORURINE YELLOW 08/23/2010 0210   APPEARANCEUR CLOUDY* 08/23/2010 0210   LABSPEC 1.013 08/23/2010 0210   PHURINE 6.0 08/23/2010 0210   GLUCOSEU NEGATIVE 08/23/2010 0210   HGBUR NEGATIVE 08/23/2010 0210   BILIRUBINUR NEGATIVE 08/23/2010 0210   KETONESUR NEGATIVE 08/23/2010 0210   PROTEINUR NEGATIVE 08/23/2010 0210   UROBILINOGEN 1.0 08/23/2010 0210   NITRITE NEGATIVE 08/23/2010 0210   LEUKOCYTESUR NEGATIVE 08/23/2010 0210     Imaging results:  Dg Chest 2 View  02/29/2012  *RADIOLOGY REPORT*  Clinical Data: Worsening wheezing, shortness of breath.  CHEST - 2 VIEW  Comparison: 02/27/2012.  Findings: The heart, mediastinum and hilar contours remain normal. There is no evidence of focal alveolar consolidation.  The interstitium appears overall slightly prominent and is slightly more prominent than on the previous study and as compared to earlier examinations.  No effusions.  Chronic changes involving the right shoulder and right clavicle again identified and stable.  No acute bony findings  IMPRESSION: Interstitial prominence is present and may be slightly worse than on the preceding study.  The possibility of a superimposed inflammatory process or even mild edema cannot be excluded.  No focal changes.   Original Report  Authenticated By: Sander Radon, M.D.    Dg Chest Port 1 View  02/27/2012  *RADIOLOGY REPORT*  Clinical Data: Respiratory distress.  PORTABLE CHEST - 1 VIEW  Comparison: 10/11/2011  Findings: Heart size and vascularity are normal.  There are no infiltrates or effusions.  No acute osseous abnormality.  IMPRESSION: No acute disease.   Original Report Authenticated By: Francene Boyers, M.D.    ECHO this admission:   IMPRESSION:  A bradycardic atrial fibrillation rhythm was noted on this study, making this study technically difficult for the assessment of cardiac function. Consistent with severe coronary artery disease, predominantly involving the LAD. Consistent with ischemic cardiomyopathy due to prior infarction with congestive heart failure, with elevated left-sided pressure. The dysfunction is both systolic and diastolic.There was an apparent, medium-sized, calcified, fixedthrombus.  Assessment and Plan: Shortness of breath - multiple etiologies considered CHF exacerbation, COPD exacerbation. CXR did not show consolidation. On lasix, solumedrol and doxycycline for COPD exacerbation. ECHO done. Results as above.   Anemia and black tarry stools:  Has history of GI losses in the past. EGD scheduled.  New fixed LV thrombus:  We will seek cardiology consult for this. We also need to determine the aggressiveness of care that the patient and her family wants.   Thrombocytopenia:   Lab 02/29/12 1143 02/29/12 0312 02/28/12 1844 02/28/12 1050 02/27/12 2029  PLT 95* 82* 62* 61* 61*   No bleeding, chronic, continue to monitor.  Neutropenia:  Resolved for now when she is on steroids.   Lab 02/29/12 1143 02/29/12 0312 02/28/12 1844  HGB 8.5* 7.8* 7.8*  HCT 25.5* 23.7* 23.3*  WBC 8.8 6.6 5.0  PLT 95* 82* 62*   Other chronic issues per resident note.   Thanks, Aletta Edouard, MD 12/17/20132:55 PM

## 2012-02-29 NOTE — Progress Notes (Signed)
Spoke with Dr. Dorothe Pea.  He informed me that this patient has had GIB in the past with previous upper endoscopy, colonoscopy, and barium enema 2009 found to have diverticulosis.  She has had GI hemorrhages in the past as well as BRBPR due to hemorrhoids.  She also has had significantly elevated BUN with history GIB.  She was discontinued from anticoagulation Coumadin due to risk of bleeding outweighing the benefits.   Shirlee Latch MD

## 2012-02-29 NOTE — Progress Notes (Signed)
[facility] Progress Note  Subjective: Patient was seen in followup today for anemia and melena. She is feeling short of breath today. Her primary care team is aware of this and a chest x-ray has been ordered. Treatment for this is being instituted by the primary care team. I did receive report from the patient's nurse that her son and daughter-in-law we have consented to going ahead with EGD to evaluate the upper GI tract, and patient is also agreeable.  Objective: Vital signs in last 24 hours: Temp:  [97.2 F (36.2 C)-98.5 F (36.9 C)] 98.5 F (36.9 C) (12/17 0940) Pulse Rate:  [60-88] 82  (12/17 0940) Resp:  [20-26] 20  (12/17 0940) BP: (109-140)/(64-74) 140/73 mmHg (12/17 0940) SpO2:  [98 %-100 %] 98 % (12/17 0940) Weight:  [71.9 kg (158 lb 8.2 oz)-72.8 kg (160 lb 7.9 oz)] 72.8 kg (160 lb 7.9 oz) (12/17 0328) Weight change: -1 kg (-2 lb 3.3 oz)   PE:  She appears short of breath  Heart regular rhythm  Lungs reveal bilateral wheezing and rhonchi.  Lab Results: Results for orders placed during the hospital encounter of 02/27/12 (from the past 24 hour(s))  GLUCOSE, CAPILLARY     Status: Abnormal   Collection Time   02/28/12 12:07 PM      Component Value Range   Glucose-Capillary 202 (*) 70 - 99 mg/dL  OCCULT BLOOD X 1 CARD TO LAB, STOOL     Status: Normal   Collection Time   02/28/12  2:07 PM      Component Value Range   Fecal Occult Bld NEGATIVE  NEGATIVE  TROPONIN I     Status: Normal   Collection Time   02/28/12  3:43 PM      Component Value Range   Troponin I <0.30  <0.30 ng/mL  GLUCOSE, CAPILLARY     Status: Abnormal   Collection Time   02/28/12  4:14 PM      Component Value Range   Glucose-Capillary 176 (*) 70 - 99 mg/dL  CBC     Status: Abnormal   Collection Time   02/28/12  6:44 PM      Component Value Range   WBC 5.0  4.0 - 10.5 K/uL   RBC 2.66 (*) 3.87 - 5.11 MIL/uL   Hemoglobin 7.8 (*) 12.0 - 15.0 g/dL   HCT 45.4 (*) 09.8 - 11.9 %   MCV  87.6  78.0 - 100.0 fL   MCH 29.3  26.0 - 34.0 pg   MCHC 33.5  30.0 - 36.0 g/dL   RDW 14.7  82.9 - 56.2 %   Platelets 62 (*) 150 - 400 K/uL  GLUCOSE, CAPILLARY     Status: Abnormal   Collection Time   02/28/12  9:23 PM      Component Value Range   Glucose-Capillary 209 (*) 70 - 99 mg/dL  CBC     Status: Abnormal   Collection Time   02/29/12  3:12 AM      Component Value Range   WBC 6.6  4.0 - 10.5 K/uL   RBC 2.68 (*) 3.87 - 5.11 MIL/uL   Hemoglobin 7.8 (*) 12.0 - 15.0 g/dL   HCT 13.0 (*) 86.5 - 78.4 %   MCV 88.4  78.0 - 100.0 fL   MCH 29.1  26.0 - 34.0 pg   MCHC 32.9  30.0 - 36.0 g/dL   RDW 69.6  29.5 - 28.4 %   Platelets 82 (*) 150 - 400 K/uL  BASIC METABOLIC PANEL     Status: Abnormal   Collection Time   02/29/12  3:12 AM      Component Value Range   Sodium 130 (*) 135 - 145 mEq/L   Potassium 4.1  3.5 - 5.1 mEq/L   Chloride 95 (*) 96 - 112 mEq/L   CO2 21  19 - 32 mEq/L   Glucose, Bld 179 (*) 70 - 99 mg/dL   BUN 55 (*) 6 - 23 mg/dL   Creatinine, Ser 9.60 (*) 0.50 - 1.10 mg/dL   Calcium 8.6  8.4 - 45.4 mg/dL   GFR calc non Af Amer 38 (*) >90 mL/min   GFR calc Af Amer 44 (*) >90 mL/min  DIFFERENTIAL     Status: Abnormal   Collection Time   02/29/12  3:12 AM      Component Value Range   Neutrophils Relative 85 (*) 43 - 77 %   Lymphocytes Relative 8 (*) 12 - 46 %   Monocytes Relative 7  3 - 12 %   Eosinophils Relative 0  0 - 5 %   Basophils Relative 0  0 - 1 %   Neutro Abs 5.6  1.7 - 7.7 K/uL   Lymphs Abs 0.5 (*) 0.7 - 4.0 K/uL   Monocytes Absolute 0.5  0.1 - 1.0 K/uL   Eosinophils Absolute 0.0  0.0 - 0.7 K/uL   Basophils Absolute 0.0  0.0 - 0.1 K/uL   RBC Morphology POLYCHROMASIA PRESENT    GLUCOSE, CAPILLARY     Status: Abnormal   Collection Time   02/29/12  6:06 AM      Component Value Range   Glucose-Capillary 214 (*) 70 - 99 mg/dL  PREPARE RBC (CROSSMATCH)     Status: Normal   Collection Time   02/29/12 11:28 AM      Component Value Range   Order  Confirmation ORDER PROCESSED BY BLOOD BANK      Studies/Results: @RISRSLT24 @    Assessment: Anemia, melena  Plan: I will proceed with EGD tomorrow provided that her respiratory status has improved.    Graylin Shiver 02/29/2012, 11:47 AM  Lab Results  Component Value Date   HGB 7.8* 02/29/2012   HGB 7.8* 02/28/2012   HGB 8.3* 02/28/2012   HCT 23.7* 02/29/2012   HCT 23.3* 02/28/2012   HCT 24.9* 02/28/2012   ALKPHOS 71 02/28/2012   ALKPHOS 84 08/23/2010   ALKPHOS 48 04/30/2010   AST 34 02/28/2012   AST 43* 08/23/2010   AST 50* 04/30/2010   ALT 14 02/28/2012   ALT 25 08/23/2010   ALT 18 04/30/2010

## 2012-02-29 NOTE — Consult Note (Signed)
Reason for Consult: Acute on chronic CHF exacerbation and worsening systolic function on Echo.  Requesting Physician: TRH  HPI: This is a 78 y.o. female, and patient of Dr. Royann Shivers, with a past medical history significant for severe chronic pulmonary and cardiac disease. She has COPD as well as congestive heart failure. Significant comorbidities include mildly advanced dementia, mild aortic stenosis, diabetes mellitus type 2 requiring insulin, anxiety, depression, chronic left bundle branch block and paroxysmal atrial fibrillation.  She is being seen by our services for acute on chronic congestive heart failure exacerbation. In the past, her HF has been due to mostly diastolic dysfunction with a minor component of systolic failure. However, her most recent echo (02/28/12) shows worsening systolic function with an EF of 20-25%, grade 1 diastolic dysfunction, dyskinesis of the mid inferoseptal and apical septal myocardium; akinesis of the apical myocardium; severe hypokinesis of the mid anteroseptal myocardium; moderate hypokinesis of the apical anterior and basal inferoseptal myocardium. Also noted was a bradycardic rhythm atrial fibrillation, medium fixed calcified thrombus in the LV, severe CAD (esp. LAD), and ischemic cardiomyopathy. Her last admission for CHF exacerbation was in June 2012. An echo during that time demonstrated an estimated EF of 45-50%. An elective catheterization was also performed at that time, which revealed normal coronary arteries and an estimated EF of 35-40%.    She presented to the ED on 12/15 in respiratory distress. BNP on admission was 7K. Cardiac enzymes were negative x 3. PT reports gradual worsening of SOB over the course of 1 week, prior to coming to the hospital. She was having SOB at rest and reported PND. Also endorses increasing fatigue x 1 week. She denies CP. She reports compliance with her home medications.  PMHx:  Past Medical History  Diagnosis Date  .  Dementia, unspecified, with behavioral disturbance   . Alzheimer's disease   . Diabetes mellitus without complication   . Chronic kidney disease, stage III (moderate)   . Unspecified vitamin D deficiency   . Arthritis     osteo  . Hypertension   . Hyperlipidemia   . Depression   . GERD (gastroesophageal reflux disease)   . Cataract   . Exudative senile macular degeneration of retina   . Retinal neovascularization NOS   . Unspecified systolic heart failure   . Mild nonproliferative diabetic retinopathy(362.04)   . Atrial fibrillation   . Pure hypercholesterolemia   . Esophageal reflux   . Blood in stool   . Primary localized osteoarthrosis, lower leg   . Other chronic pain   . Lumbago   . Vitamin D deficiency   . Macular degeneration   . Chronic pain     back   Past Surgical History  Procedure Date  . Knee surgery   . Appendectomy   . Tonsillectomy     FAMHx: Family History  Problem Relation Age of Onset  . Stroke Mother   . Heart disease Father   . Cancer Brother     SOCHx:  reports that she has quit smoking. She has never used smokeless tobacco. She reports that she does not drink alcohol or use illicit drugs.  ALLERGIES: Allergies  Allergen Reactions  . Paroxetine     unknown    ROS: Respiratory: positive for cough, dyspnea on exertion, sputum and wheezing Cardiovascular: positive for dyspnea, fatigue and orthopnea, negative for chest pain, exertional chest pressure/discomfort and lower extremity edema  HOME MEDICATIONS: Prescriptions prior to admission  Medication Sig Dispense Refill  . acetaminophen (TYLENOL)  325 MG tablet Take 650 mg by mouth every 4 (four) hours as needed.      . Albuterol Sulfate (PROAIR HFA IN) Inhale 1 application into the lungs daily as needed. For shortness of breath      . Calcium Carbonate (CALTRATE 600 PO) Take by mouth daily.      . cholecalciferol (VITAMIN D) 1000 UNITS tablet Take 1,000 Units by mouth daily.      .  citalopram (CELEXA) 20 MG tablet Take 20 mg by mouth daily.      . furosemide (LASIX) 40 MG tablet Take 40 mg by mouth daily.      Marland Kitchen HYDROcodone-acetaminophen (VICODIN) 5-500 MG per tablet Take 1 tablet by mouth every 6 (six) hours. For pain      . isosorbide mononitrate (IMDUR) 30 MG 24 hr tablet Take 30 mg by mouth daily.      Marland Kitchen lisinopril (PRINIVIL,ZESTRIL) 10 MG tablet Take 10 mg by mouth daily.      . Loperamide HCl (IMODIUM PO) Take 1 tablet by mouth daily as needed.       . lovastatin (MEVACOR) 40 MG tablet Take 40 mg by mouth at bedtime.      . metoprolol tartrate (LOPRESSOR) 25 MG tablet Take 25 mg by mouth 2 (two) times daily.      . Multiple Vitamins-Minerals (OCUVITE PO) Take by mouth.      . nystatin cream (MYCOSTATIN) Apply 1 application topically daily as needed. For rash      . omeprazole (PRILOSEC) 20 MG capsule Take 20 mg by mouth daily.      . potassium chloride (K-DUR) 10 MEQ tablet Take 20 mEq by mouth 2 (two) times daily.      . pseudoephedrine (SUDAFED) 120 MG 12 hr tablet Take 120 mg by mouth every 12 (twelve) hours. For congestion      . triamcinolone cream (KENALOG) 0.5 % Apply topically 3 (three) times daily.        HOSPITAL MEDICATIONS: I have reviewed the patient's current medications.  VITALS: Blood pressure 129/68, pulse 102, temperature 98.3 F (36.8 C), temperature source Oral, resp. rate 24, height 5\' 1"  (1.549 m), weight 72.8 kg (160 lb 7.9 oz), SpO2 97.00%.  PHYSICAL EXAM: General appearance: alert, cooperative and mild distress Lungs: rales bilaterally and wheezes bilaterally Heart: regular rate and rhythm and heart sounds were slightly masked by wheezing Extremities: no LEE Pulses: 2+ and symmetric Skin: warm and dry Neurologic: Grossly normal  LABS: Results for orders placed during the hospital encounter of 02/27/12 (from the past 48 hour(s))  CBC     Status: Abnormal   Collection Time   02/27/12  8:29 PM      Component Value Range Comment    WBC 3.4 (*) 4.0 - 10.5 K/uL    RBC 2.53 (*) 3.87 - 5.11 MIL/uL    Hemoglobin 7.4 (*) 12.0 - 15.0 g/dL    HCT 16.1 (*) 09.6 - 46.0 %    MCV 90.5  78.0 - 100.0 fL    MCH 29.2  26.0 - 34.0 pg    MCHC 32.3  30.0 - 36.0 g/dL    RDW 04.5  40.9 - 81.1 %    Platelets 61 (*) 150 - 400 K/uL   BASIC METABOLIC PANEL     Status: Abnormal   Collection Time   02/27/12  8:29 PM      Component Value Range Comment   Sodium 131 (*) 135 - 145 mEq/L  Potassium 4.5  3.5 - 5.1 mEq/L    Chloride 98  96 - 112 mEq/L    CO2 19  19 - 32 mEq/L    Glucose, Bld 187 (*) 70 - 99 mg/dL    BUN 35 (*) 6 - 23 mg/dL    Creatinine, Ser 5.62  0.50 - 1.10 mg/dL    Calcium 8.9  8.4 - 13.0 mg/dL    GFR calc non Af Amer 48 (*) >90 mL/min    GFR calc Af Amer 55 (*) >90 mL/min   PROTIME-INR     Status: Normal   Collection Time   02/27/12  8:29 PM      Component Value Range Comment   Prothrombin Time 15.2  11.6 - 15.2 seconds    INR 1.22  0.00 - 1.49   PRO B NATRIURETIC PEPTIDE     Status: Abnormal   Collection Time   02/27/12  8:30 PM      Component Value Range Comment   Pro B Natriuretic peptide (BNP) 7174.0 (*) 0 - 450 pg/mL   TROPONIN I     Status: Normal   Collection Time   02/27/12  8:30 PM      Component Value Range Comment   Troponin I <0.30  <0.30 ng/mL   POCT I-STAT TROPONIN I     Status: Normal   Collection Time   02/27/12  8:40 PM      Component Value Range Comment   Troponin i, poc 0.06  0.00 - 0.08 ng/mL    Comment 3            POCT I-STAT 3, BLOOD GAS (G3+)     Status: Abnormal   Collection Time   02/27/12  9:24 PM      Component Value Range Comment   pH, Arterial 7.423  7.350 - 7.450    pCO2 arterial 31.3 (*) 35.0 - 45.0 mmHg    pO2, Arterial 233.0 (*) 80.0 - 100.0 mmHg    Bicarbonate 20.4  20.0 - 24.0 mEq/L    TCO2 21  0 - 100 mmol/L    O2 Saturation 100.0      Acid-base deficit 4.0 (*) 0.0 - 2.0 mmol/L    Patient temperature 98.6 F      Collection site RADIAL, ALLEN'S TEST ACCEPTABLE       Drawn by Operator      Sample type ARTERIAL     TYPE AND SCREEN     Status: Normal (Preliminary result)   Collection Time   02/27/12 10:20 PM      Component Value Range Comment   ABO/RH(D) A POS      Antibody Screen NEG      Sample Expiration 03/01/2012      Unit Number [id number]      Blood Component Type RED CELLS,LR      Unit division 00      Status of Unit ISSUED,FINAL      Transfusion Status OK TO TRANSFUSE      Crossmatch Result Compatible      Unit Number [id number]      Blood Component Type RED CELLS,LR      Unit division 00      Status of Unit ALLOCATED      Transfusion Status OK TO TRANSFUSE      Crossmatch Result Compatible      Unit Number [id number]      Blood Component Type RED CELLS,LR      Unit division 00  Status of Unit ISSUED      Transfusion Status OK TO TRANSFUSE      Crossmatch Result Compatible     MRSA PCR SCREENING     Status: Abnormal   Collection Time   02/27/12 11:38 PM      Component Value Range Comment   MRSA by PCR POSITIVE (*) NEGATIVE   PREPARE RBC (CROSSMATCH)     Status: Normal   Collection Time   02/27/12 11:59 PM      Component Value Range Comment   Order Confirmation ORDER PROCESSED BY BLOOD BANK     HEMOGLOBIN A1C     Status: Abnormal   Collection Time   02/28/12 12:29 AM      Component Value Range Comment   Hemoglobin A1C 6.3 (*) <5.7 %    Mean Plasma Glucose 134 (*) <117 mg/dL   VITAMIN Z61     Status: Normal   Collection Time   02/28/12 12:29 AM      Component Value Range Comment   Vitamin B-12 672  211 - 911 pg/mL   FOLATE     Status: Normal   Collection Time   02/28/12 12:29 AM      Component Value Range Comment   Folate >20.0     IRON AND TIBC     Status: Abnormal   Collection Time   02/28/12 12:29 AM      Component Value Range Comment   Iron 20 (*) 42 - 135 ug/dL    TIBC 096  045 - 409 ug/dL    Saturation Ratios 7 (*) 20 - 55 %    UIBC 271  125 - 400 ug/dL   FERRITIN     Status: Normal    Collection Time   02/28/12 12:29 AM      Component Value Range Comment   Ferritin 90  10 - 291 ng/mL   RETICULOCYTES     Status: Abnormal   Collection Time   02/28/12 12:29 AM      Component Value Range Comment   Retic Ct Pct 1.7  0.4 - 3.1 %    RBC. 2.35 (*) 3.87 - 5.11 MIL/uL    Retic Count, Manual 40.0  19.0 - 186.0 K/uL   GLUCOSE, CAPILLARY     Status: Abnormal   Collection Time   02/28/12  1:23 AM      Component Value Range Comment   Glucose-Capillary 224 (*) 70 - 99 mg/dL   MAGNESIUM     Status: Normal   Collection Time   02/28/12  6:00 AM      Component Value Range Comment   Magnesium 2.0  1.5 - 2.5 mg/dL   PHOSPHORUS     Status: Normal   Collection Time   02/28/12  6:00 AM      Component Value Range Comment   Phosphorus 3.7  2.3 - 4.6 mg/dL   TSH     Status: Normal   Collection Time   02/28/12  6:00 AM      Component Value Range Comment   TSH 0.516  0.350 - 4.500 uIU/mL   COMPREHENSIVE METABOLIC PANEL     Status: Abnormal   Collection Time   02/28/12  6:00 AM      Component Value Range Comment   Sodium 133 (*) 135 - 145 mEq/L    Potassium 4.9  3.5 - 5.1 mEq/L    Chloride 101  96 - 112 mEq/L    CO2 21  19 -  32 mEq/L    Glucose, Bld 254 (*) 70 - 99 mg/dL    BUN 37 (*) 6 - 23 mg/dL    Creatinine, Ser 4.09  0.50 - 1.10 mg/dL    Calcium 8.9  8.4 - 81.1 mg/dL    Total Protein 6.3  6.0 - 8.3 g/dL    Albumin 3.0 (*) 3.5 - 5.2 g/dL    AST 34  0 - 37 U/L    ALT 14  0 - 35 U/L    Alkaline Phosphatase 71  39 - 117 U/L    Total Bilirubin 0.9  0.3 - 1.2 mg/dL    GFR calc non Af Amer 46 (*) >90 mL/min    GFR calc Af Amer 53 (*) >90 mL/min   TROPONIN I     Status: Normal   Collection Time   02/28/12  6:00 AM      Component Value Range Comment   Troponin I <0.30  <0.30 ng/mL   CREATININE, URINE, RANDOM     Status: Normal   Collection Time   02/28/12  6:19 AM      Component Value Range Comment   Creatinine, Urine 28.05     SODIUM, URINE, RANDOM     Status: Normal    Collection Time   02/28/12  6:19 AM      Component Value Range Comment   Sodium, Ur 36     OSMOLALITY, URINE     Status: Abnormal   Collection Time   02/28/12  6:19 AM      Component Value Range Comment   Osmolality, Ur 295 (*) 390 - 1090 mOsm/kg   GLUCOSE, CAPILLARY     Status: Abnormal   Collection Time   02/28/12  7:46 AM      Component Value Range Comment   Glucose-Capillary 231 (*) 70 - 99 mg/dL   TROPONIN I     Status: Normal   Collection Time   02/28/12  9:15 AM      Component Value Range Comment   Troponin I <0.30  <0.30 ng/mL   DIFFERENTIAL     Status: Abnormal   Collection Time   02/28/12 10:50 AM      Component Value Range Comment   Neutrophils Relative 73  43 - 77 %    Neutro Abs 1.0 (*) 1.7 - 7.7 K/uL    Lymphocytes Relative 23  12 - 46 %    Lymphs Abs 0.3 (*) 0.7 - 4.0 K/uL    Monocytes Relative 4  3 - 12 %    Monocytes Absolute 0.1  0.1 - 1.0 K/uL    Eosinophils Relative 0  0 - 5 %    Eosinophils Absolute 0.0  0.0 - 0.7 K/uL    Basophils Relative 1  0 - 1 %    Basophils Absolute 0.0  0.0 - 0.1 K/uL   CBC     Status: Abnormal   Collection Time   02/28/12 10:50 AM      Component Value Range Comment   WBC 1.4 (*) 4.0 - 10.5 K/uL    RBC 2.85 (*) 3.87 - 5.11 MIL/uL    Hemoglobin 8.3 (*) 12.0 - 15.0 g/dL    HCT 91.4 (*) 78.2 - 46.0 %    MCV 87.4  78.0 - 100.0 fL    MCH 29.1  26.0 - 34.0 pg    MCHC 33.3  30.0 - 36.0 g/dL    RDW 95.6  21.3 - 08.6 %  Platelets 61 (*) 150 - 400 K/uL   TECHNOLOGIST SMEAR REVIEW     Status: Normal   Collection Time   02/28/12 10:50 AM      Component Value Range Comment   Tech Review ELLIPTOCYTES   POLYCHROMASIA PRESENT  GLUCOSE, CAPILLARY     Status: Abnormal   Collection Time   02/28/12 12:07 PM      Component Value Range Comment   Glucose-Capillary 202 (*) 70 - 99 mg/dL   OCCULT BLOOD X 1 CARD TO LAB, STOOL     Status: Normal   Collection Time   02/28/12  2:07 PM      Component Value Range Comment   Fecal Occult Bld  NEGATIVE  NEGATIVE   TROPONIN I     Status: Normal   Collection Time   02/28/12  3:43 PM      Component Value Range Comment   Troponin I <0.30  <0.30 ng/mL   GLUCOSE, CAPILLARY     Status: Abnormal   Collection Time   02/28/12  4:14 PM      Component Value Range Comment   Glucose-Capillary 176 (*) 70 - 99 mg/dL   CBC     Status: Abnormal   Collection Time   02/28/12  6:44 PM      Component Value Range Comment   WBC 5.0  4.0 - 10.5 K/uL    RBC 2.66 (*) 3.87 - 5.11 MIL/uL    Hemoglobin 7.8 (*) 12.0 - 15.0 g/dL    HCT 16.1 (*) 09.6 - 46.0 %    MCV 87.6  78.0 - 100.0 fL    MCH 29.3  26.0 - 34.0 pg    MCHC 33.5  30.0 - 36.0 g/dL    RDW 04.5  40.9 - 81.1 %    Platelets 62 (*) 150 - 400 K/uL CONSISTENT WITH PREVIOUS RESULT  GLUCOSE, CAPILLARY     Status: Abnormal   Collection Time   02/28/12  9:23 PM      Component Value Range Comment   Glucose-Capillary 209 (*) 70 - 99 mg/dL   CBC     Status: Abnormal   Collection Time   02/29/12  3:12 AM      Component Value Range Comment   WBC 6.6  4.0 - 10.5 K/uL    RBC 2.68 (*) 3.87 - 5.11 MIL/uL    Hemoglobin 7.8 (*) 12.0 - 15.0 g/dL    HCT 91.4 (*) 78.2 - 46.0 %    MCV 88.4  78.0 - 100.0 fL    MCH 29.1  26.0 - 34.0 pg    MCHC 32.9  30.0 - 36.0 g/dL    RDW 95.6  21.3 - 08.6 %    Platelets 82 (*) 150 - 400 K/uL CONSISTENT WITH PREVIOUS RESULT  BASIC METABOLIC PANEL     Status: Abnormal   Collection Time   02/29/12  3:12 AM      Component Value Range Comment   Sodium 130 (*) 135 - 145 mEq/L    Potassium 4.1  3.5 - 5.1 mEq/L    Chloride 95 (*) 96 - 112 mEq/L    CO2 21  19 - 32 mEq/L    Glucose, Bld 179 (*) 70 - 99 mg/dL    BUN 55 (*) 6 - 23 mg/dL    Creatinine, Ser 5.78 (*) 0.50 - 1.10 mg/dL    Calcium 8.6  8.4 - 46.9 mg/dL    GFR calc non Af Amer 38 (*) >90  mL/min    GFR calc Af Amer 44 (*) >90 mL/min   DIFFERENTIAL     Status: Abnormal   Collection Time   02/29/12  3:12 AM      Component Value Range Comment   Neutrophils  Relative 85 (*) 43 - 77 %    Lymphocytes Relative 8 (*) 12 - 46 %    Monocytes Relative 7  3 - 12 %    Eosinophils Relative 0  0 - 5 %    Basophils Relative 0  0 - 1 %    Neutro Abs 5.6  1.7 - 7.7 K/uL    Lymphs Abs 0.5 (*) 0.7 - 4.0 K/uL    Monocytes Absolute 0.5  0.1 - 1.0 K/uL    Eosinophils Absolute 0.0  0.0 - 0.7 K/uL    Basophils Absolute 0.0  0.0 - 0.1 K/uL    RBC Morphology POLYCHROMASIA PRESENT     GLUCOSE, CAPILLARY     Status: Abnormal   Collection Time   02/29/12  6:06 AM      Component Value Range Comment   Glucose-Capillary 214 (*) 70 - 99 mg/dL   PREPARE RBC (CROSSMATCH)     Status: Normal   Collection Time   02/29/12 11:28 AM      Component Value Range Comment   Order Confirmation ORDER PROCESSED BY BLOOD BANK     CBC     Status: Abnormal   Collection Time   02/29/12 11:43 AM      Component Value Range Comment   WBC 8.8  4.0 - 10.5 K/uL    RBC 2.89 (*) 3.87 - 5.11 MIL/uL    Hemoglobin 8.5 (*) 12.0 - 15.0 g/dL    HCT 40.9 (*) 81.1 - 46.0 %    MCV 88.2  78.0 - 100.0 fL    MCH 29.4  26.0 - 34.0 pg    MCHC 33.3  30.0 - 36.0 g/dL    RDW 91.4  78.2 - 95.6 %    Platelets 95 (*) 150 - 400 K/uL CONSISTENT WITH PREVIOUS RESULT    IMAGING: Dg Chest 2 View  02/29/2012  *RADIOLOGY REPORT*  Clinical Data: Worsening wheezing, shortness of breath.  CHEST - 2 VIEW  Comparison: 02/27/2012.  Findings: The heart, mediastinum and hilar contours remain normal. There is no evidence of focal alveolar consolidation.  The interstitium appears overall slightly prominent and is slightly more prominent than on the previous study and as compared to earlier examinations.  No effusions.  Chronic changes involving the right shoulder and right clavicle again identified and stable.  No acute bony findings  IMPRESSION: Interstitial prominence is present and may be slightly worse than on the preceding study.  The possibility of a superimposed inflammatory process or even mild edema cannot be  excluded.  No focal changes.   Original Report Authenticated By: Sander Radon, M.D.    Dg Chest Port 1 View  02/27/2012  *RADIOLOGY REPORT*  Clinical Data: Respiratory distress.  PORTABLE CHEST - 1 VIEW  Comparison: 10/11/2011  Findings: Heart size and vascularity are normal.  There are no infiltrates or effusions.  No acute osseous abnormality.  IMPRESSION: No acute disease.   Original Report Authenticated By: Francene Boyers, M.D.     IMPRESSION: Principal Problem:  *Systolic CHF, acute on chronic (worsening LV function) EF of 20-25% on echo (12/16) Active Problems:  DM  HYPERLIPIDEMIA  HYPERTENSION  Combined systolic and diastolic heart failure  DYSPNEA  Microcytic anemia  MRSA carrier (  nares)  Hyponatremia  Thrombus  Ischemic cardiomyopathy  History of atrial fibrillation  Pancytopenia   RECOMMENDATION: Most recent echo yesterday demonstrated worsening systolic function w/ an EF of 16-10%, compared to prior echo done in 6/12, which showed an EF of 45-50%. Last cath in 6/12 showed normal coronaries with an estimated EF of 35-40%, which makes ischemic cardiomyopathy less likely. Continue to diurese with Lasix. LV thrombus was also found on echo. Will need to consider restarting Coumadin therapy. Per hospitalist, the pt was taken off coumadin by PCP due to past GI bleed. Cardiologist to follow with recommendation. Consider switching from Lopressor to Coreg 6.25 mg BID. Pt will likely need a LifeVest before d/c. Time Spent Directly with Patient: 30 minutes  KILROY,LUKE K 02/29/2012, 2:34 PM  Agree with note written by Corine Shelter PAC  Pt with NISCM, COPD . Admitted with CHF exacerbation. LVEF 25 % (worse) compared with EF 6/12. BNP 7000. On furosemide and metoprolol at home. BUN/SCr increasing. Exam notable for distant BS and occasional wheezes. On inhaled bronchodilators and getting steroids.  Would switch metop to coreg. Follow renal function/BNP. Appears to be getting dry. PT  is a DNR but I did have a conversation regarding LifeVest and ICD. Pt has BBB and may also be a candidate for CRT.  Runell Gess 02/29/2012 4:54 PM

## 2012-02-29 NOTE — Progress Notes (Signed)
Orthostatic vital signs completed.  Patient tolerated well.

## 2012-02-29 NOTE — Progress Notes (Signed)
Patient has had increased shortness of breath all morning, patient given albuterol nebulizer treatment via respiratory. Patient also given albuterol inhaler PRN via nurse; will continue to monitor patient.  Lorretta Harp RN

## 2012-02-29 NOTE — Progress Notes (Signed)
Spoke with daughter in law Lindsay.  She wants upper endoscopy.  If no source of bleeding can be identified she is agreeable to anticoagulation with coumadin.  She states patient has seen Dr. Shelle Iron in the past for pulmonolgy 4 years ago and was told she does not have COPD.  Daughter in law asked if low blood lines could be related to cancer because this has been going on for a while and no w/u has been done.  French Ana states pt has had Wt loss 225-->160 lbs over 1 year.  She has had less appetite and moving more.  She will update the husband who will be to see the patient to discuss today.   Shirlee Latch MD

## 2012-02-29 NOTE — Progress Notes (Addendum)
Subjective: Patient feels lousy today due to wheezing and sob.  She denies chest pain.  She denies black tarry stool.   RN: she is saturating 98%  Objective: Vital signs in last 24 hours: Filed Vitals:   02/29/12 0649 02/29/12 0650 02/29/12 0936 02/29/12 0940  BP: 109/67 109/69 140/73 140/73  Pulse: 62 69 82 82  Temp:    98.5 F (36.9 C)  TempSrc:    Oral  Resp:    20  Height:      Weight:      SpO2:    98%   Weight change: -2 lb 3.3 oz (-1 kg)  Intake/Output Summary (Last 24 hours) at 02/29/12 1151 Last data filed at 02/29/12 1144  Gross per 24 hour  Intake    606 ml  Output   2325 ml  Net  -1719 ml   General: standing by her bed getting a bath, nad, alert and oriented x 3 HEENT: [employer]/at CV: RRR no rubs, murmurs, gallops Lungs: worsening wheezing b/l Abdomen: +mild RLQ with palpation, soft, normal bs, other areas no ttp, nd Extremities: warm, no cyanosis or edema Neuro: CN 2-12 grossly intact, moving all 4 extremities  Lab Results: Basic Metabolic Panel:  Lab 02/29/12 1610 02/28/12 0600  NA 130* 133*  K 4.1 4.9  CL 95* 101  CO2 21 21  GLUCOSE 179* 254*  BUN 55* 37*  CREATININE 1.30* 1.10  CALCIUM 8.6 8.9  MG -- 2.0  PHOS -- 3.7   Liver Function Tests:  Lab 02/28/12 0600  AST 34  ALT 14  ALKPHOS 71  BILITOT 0.9  PROT 6.3  ALBUMIN 3.0*   CBC:  Lab 02/29/12 0312 02/28/12 1844 02/28/12 1050  WBC 6.6 5.0 --  NEUTROABS 5.6 -- 1.0*  HGB 7.8* 7.8* --  HCT 23.7* 23.3* --  MCV 88.4 87.6 --  PLT 82* 62* --   Cardiac Enzymes:  Lab 02/28/12 1543 02/28/12 0915 02/28/12 0600  CKTOTAL -- -- --  CKMB -- -- --  CKMBINDEX -- -- --  TROPONINI <0.30 <0.30 <0.30   BNP:  Lab 02/27/12 2030  PROBNP 7174.0*   CBG:  Lab 02/29/12 0606 02/28/12 2123 02/28/12 1614 02/28/12 1207 02/28/12 0746 02/28/12 0123  GLUCAP 214* 209* 176* 202* 231* 224*   Coagulation:  Lab 02/27/12 2029  LABPROT 15.2  INR 1.22   Anemia Panel:  Lab 02/28/12 0029  VITAMINB12 672   FOLATE >20.0  FERRITIN 90  TIBC 291  IRON 20*  RETICCTPCT 1.7   Misc. Labs: none  Micro Results: Recent Results (from the past 240 hour(s))  MRSA PCR SCREENING     Status: Abnormal   Collection Time   02/27/12 11:38 PM      Component Value Range Status Comment   MRSA by PCR POSITIVE (*) NEGATIVE Final    Studies/Results: Dg Chest 2 View  02/29/2012  *RADIOLOGY REPORT*  Clinical Data: Worsening wheezing, shortness of breath.  CHEST - 2 VIEW  Comparison: 02/27/2012.  Findings: The heart, mediastinum and hilar contours remain normal. There is no evidence of focal alveolar consolidation.  The interstitium appears overall slightly prominent and is slightly more prominent than on the previous study and as compared to earlier examinations.  No effusions.  Chronic changes involving the right shoulder and right clavicle again identified and stable.  No acute bony findings  IMPRESSION: Interstitial prominence is present and may be slightly worse than on the preceding study.  The possibility of a superimposed inflammatory process or  even mild edema cannot be excluded.  No focal changes.   Original Report Authenticated By: Sander Radon, M.D.    Dg Chest Port 1 View  02/27/2012  *RADIOLOGY REPORT*  Clinical Data: Respiratory distress.  PORTABLE CHEST - 1 VIEW  Comparison: 10/11/2011  Findings: Heart size and vascularity are normal.  There are no infiltrates or effusions.  No acute osseous abnormality.  IMPRESSION: No acute disease.   Original Report Authenticated By: Francene Boyers, M.D.    Medications: Scheduled Meds:    . sodium chloride  500 mL Intravenous Once  . albuterol  2.5 mg Nebulization TID  . antiseptic oral rinse  15 mL Mouth Rinse BID  . Chlorhexidine Gluconate Cloth  6 each Topical Q0600  . citalopram  20 mg Oral Daily  . docusate sodium  100 mg Oral BID  . doxycycline  100 mg Oral Q12H  . feeding supplement  237 mL Oral BID BM  . furosemide  20 mg Intravenous Once  .  guaiFENesin  600 mg Oral BID  . insulin aspart  0-5 Units Subcutaneous QHS  . insulin aspart  0-9 Units Subcutaneous TID WC  . ipratropium  0.5 mg Nebulization TID  . isosorbide mononitrate  30 mg Oral Daily  . lisinopril  10 mg Oral Daily  . methylPREDNISolone (SOLU-MEDROL) injection  60 mg Intravenous Q6H  . metoprolol tartrate  25 mg Oral BID  . mupirocin ointment  1 application Nasal BID  . pantoprazole (PROTONIX) IV  40 mg Intravenous Q12H  . simvastatin  20 mg Oral q1800  . sodium chloride  250 mL Intravenous Once  . sodium chloride  3 mL Intravenous Q12H  . sodium chloride  3 mL Intravenous Q12H   Continuous Infusions:  PRN Meds:.sodium chloride, acetaminophen, acetaminophen, albuterol, albuterol, HYDROcodone-acetaminophen, ondansetron (ZOFRAN) IV, ondansetron, sodium chloride, sodium chloride Assessment/Plan:  1.Acute on chronic systolic and diastolic heart failure with ischemic cardiomyopathy -associated with dyspnea, BNP 7174.0 this admission.  CE neg x 3. Elevated BUN/Creatinine rising -strict i/o (net negative 2.19 L since admission), daily wts  -02/28/12 repeat echo with worsening systolic function EF 20-25%, grade 1 diastolic dysfunction, dyskinesis of the mid inferoseptal and apical septal myocardium; akinesis of the apical myocardium; severe hypokinesis of the mid anteroseptal myocardium; moderate hypokinesis of the apical anterior and basal inferoseptal myocardium.  They also noted a bradycardic rhythm atrial fibrillation, medium fixed calcified thrombus in LV, severe CAD (esp. LAD), ischemic cardiomyopathy noted   -will hold Lasix 40 mg second dose today due to increasing BUN/Cr; home dose Lasix was 40 mg bid from [facility] notes.   -repeat CXR if appears fluid overloaded will add Lasix back  -Continue BB and ACEI, and statin  2. DYSPNEA  -etiology thought to be secondary to acute on chronic systolic failure versus pulmonary (i.e COPD exacerbation but no prior pfts on file).   Patient is a former heavy smoker.  -transitioned off Solumedrol to Prednisone 60 yesterday but due to worsening wheezing restarted Solumedrol 60 mg q6  -Continue Atrovent neb q 6 hours, prn Albuterol q4 prn inhaler and Albuterol nebulizer q2 prn -Doxycycline 100 mg bid (Day 2) for possible COPD exacerbation -Consider pft evaluation outpatient   3. Microcytic Anemia -Hemoglobin 7.4 on admission s/p 1 unit of blood with hbg 7.8 this am -[facility] consulted, following-family agreeable with upper endoscopy today (see RN note) -negative fobt, Fe deficiency anemia noted  -Protonix  40 mg bid iv  -consider Fe supplementation  -will tranfuse 2 units  pRBCs goal >8, will give Lasix in between transfusions with post transfusion cbc  4. Fixed thrombus noted in left ventricle with history of atrial fibrillation -patient also with history of atrial fibrillation and bradycardic atrial fibrillation noted on echo.  CHADS score 4. She is not on any anticoagulation prior to admission but believe she has been on in the past -will consult cards for possibly restarting anticoagulation once GI bleeding resolved holding anticoagulation for now -will discuss with family   5. pancytopenia with chronic thrombocytopenia -pending SPEP/UPEP to w/u pancytopenia for MDS or MM.  Consider outpatient bone marrow biopsy or further w/u in the future for pancytopenia with heme/onc -plts 82 today trended up. Baseline 100s  6. DM  -HA1C 6.3 -monitor cbg, fsbs may be elevated 2/2 solumedrol given (170s-200s) -continue SSI   7. HYPERTENSION  -continue to monitor BP. Continue Imdur, Lisinopril, Lopressor   8.MRSA nares -protocol  9. F/E/N -will monitor labs (hyponatremic 130 today likely secondary to diuresis will continue to monitor)  -diet ordered but will make NPO after midnight for upper endoscopy possibly tomorrow   10. DVT Px -scds     Dispo: Disposition is deferred at this time, awaiting improvement of current  medical problems.  Anticipated discharge in approximately 1-2 day(s).   The patient does have a current PCP (Thane Edu, MD), therefore will be requiring OPC follow-up after discharge.   The patient does have transportation limitations that hinder transportation to clinic appointments.  .Services Needed at time of discharge: Y = Yes, Blank = No PT:   OT:   RN:   Equipment:   Other:     LOS: 2 days   Annett Gula [phone] 02/29/2012, 11:51 AM

## 2012-02-29 NOTE — Progress Notes (Signed)
Occupational Therapy Evaluation Patient Details Name: [name] MRN: [id number] DOB: [birth year] Today's Date: 02/29/2012 Time: 7829-5621 OT Time Calculation (min): 28 min  OT Assessment / Plan / Recommendation Clinical Impression  78 yo with exacerbation of COPD and CHF. PTA, pt lived with son and participated in [place] program 4 days/ wk. Pt was alone at home on Wedmesdays. Feel pt will be able to D/C home and resume participation in [facility]. Rec that pt go to [facility] 5 days/ wk. This will provide 24/7 S after D/C and be safest option for pt and reduce risk of rehospitalization. Pt/son agree. Pt will benefit from skilled OT services to max independence with ADL and functional moiblity for ADL to facilitate D/C home.    OT Assessment  Patient needs continued OT Services    Follow Up Recommendations   (OT at [facility] program)    Barriers to Discharge None    Equipment Recommendations  None recommended by OT    Recommendations for Other Services    Frequency  Min 3X/week    Precautions / Restrictions Precautions Precautions: Fall   Pertinent Vitals/Pain Dyspnea 4/4. O2 SATS 98 2 L.    ADL  Eating/Feeding: Supervision/safety;Other (comment) (?hx of dysphagia. reprots being fatigued when eating) Where Assessed - Eating/Feeding: Chair Grooming: Supervision/safety;Set up Where Assessed - Grooming: Supported sitting Upper Body Bathing: Supervision/safety;Set up Where Assessed - Upper Body Bathing: Supported sitting Lower Body Bathing: Minimal assistance Where Assessed - Lower Body Bathing: Supported sit to stand Upper Body Dressing: Supervision/safety;Set up Where Assessed - Upper Body Dressing: Supported sitting Lower Body Dressing: Minimal assistance Where Assessed - Lower Body Dressing: Supported sit to stand Toilet Transfer: Hydrographic surveyor Method: Sit to stand;Stand pivot Acupuncturist: Materials engineer and Hygiene:  Supervision/safety Where Assessed - Engineer, mining and Hygiene: Standing Equipment Used: Gait belt Transfers/Ambulation Related to ADLs: Min guard. HHA with ambulation. Pt usually uses RW. ADL Comments: Affected by dyspnea    OT Diagnosis: Generalized weakness  OT Problem List: Decreased strength;Decreased activity tolerance;Impaired balance (sitting and/or standing);Decreased coordination;Decreased knowledge of use of DME or AE;Cardiopulmonary status limiting activity OT Treatment Interventions: Self-care/ADL training;Energy conservation;DME and/or AE instruction;Therapeutic activities;Cognitive remediation/compensation;Patient/family education;Balance training   OT Goals Acute Rehab OT Goals OT Goal Formulation: With patient Time For Goal Achievement: 03/14/12 Potential to Achieve Goals: Good ADL Goals Pt Will Perform Grooming: Standing at sink;Unsupported;with supervision Pt Will Perform Lower Body Dressing: Unsupported;Sit to stand from chair;with supervision;with set-up Pt Will Transfer to Toilet:  (dyspnea 2/4) Pt Will Perform Toileting - Clothing Manipulation: with supervision;Standing Pt Will Perform Toileting - Hygiene: with supervision;Standing at 3-in-1/toilet Additional ADL Goal #1: Complete functional moiblity @ RW level for ADL with S  Visit Information  Last OT Received On: 02/29/12 Assistance Needed: +1 Reason Eval/Treat Not Completed:  (pt receiving blood. Will attempt to return this pm.)    Subjective Data      Prior Functioning     Home Living Lives With: Family;Son Available Help at Discharge: Other (Comment) (Pt goes to [facility] progream 4 days per week) Type of Home: House Home Access: Stairs to enter [employer] of Steps: 3 Entrance Stairs-Rails: Right Home Layout: One level Bathroom Shower/Tub: Forensic scientist: Standard Bathroom Accessibility: Yes How Accessible: Accessible via walker Home Adaptive  Equipment: Bedside commode/3-in-1;Tub transfer bench;Walker - rolling Additional Comments: Son is at home when pt arrives home from [place]. Pt "reads" on days she does not go to PACE  Prior Function Level of Independence: Needs assistance;Independent with assistive device(s) Needs Assistance: Bathing;Meal Prep;Light Housekeeping Bath: Other (comment) (showers 2x/wk at [employer]) Meal Prep: Maximal Light Housekeeping: Maximal Able to Take Stairs?: Yes Driving: No Vocation: Retired Musician: HOH Dominant Hand: Right         Vision/Perception  glasses   Cognition  Overall Cognitive Status: History of cognitive impairments - at baseline Arousal/Alertness: Awake/alert Orientation Level: Appears intact for tasks assessed Behavior During Session: [facility] for tasks performed Cognition - Other Comments: Son reports mild "dementai"symptoms    Extremity/Trunk Assessment Right Upper Extremity Assessment RUE ROM/Strength/Tone: WFL for tasks assessed RUE Sensation: WFL - Light Touch;WFL - Proprioception RUE Coordination: Deficits (movement disorder?) Left Upper Extremity Assessment LUE ROM/Strength/Tone: WFL for tasks assessed LUE Sensation: WFL - Light Touch;WFL - Proprioception LUE Coordination: Deficits (?movement disorder) Trunk Assessment Trunk Assessment: Normal     Mobility Bed Mobility Bed Mobility: Not assessed Transfers Transfers: Sit to Stand;Stand to Sit Sit to Stand: 4: Min guard;With upper extremity assist;From chair/3-in-1 Stand to Sit: 4: Min guard;With upper extremity assist;To chair/3-in-1 Details for Transfer Assistance: vc for safety     Shoulder Instructions     Exercise     Balance  min A   End of Session OT - End of Session Equipment Utilized During Treatment: Gait belt Activity Tolerance: Patient limited by fatigue Patient left: in chair;with call bell/phone within reach;with family/visitor present Nurse Communication: Mobility status   GO     Hadar Elgersma,HILLARY 02/29/2012, 4:56 PM [facility], OTR/L  [phone] 02/29/2012

## 2012-02-29 NOTE — Progress Notes (Signed)
OT Cancellation Note  Patient Details Name: [name] MRN: [id number] DOB: 01-08-1930   Cancelled Treatment:    Reason Eval/Treat Not Completed: Medical issues which prohibited therapy (pt receiving blood. Will attempt to return this pm.)  [facility], OTR/L  [phone] 02/29/2012 02/29/2012, 1:59 PM

## 2012-03-01 ENCOUNTER — Encounter (HOSPITAL_COMMUNITY): Payer: Self-pay | Admitting: *Deleted

## 2012-03-01 ENCOUNTER — Encounter (HOSPITAL_COMMUNITY)
Admission: EM | Disposition: A | Discharge status: Home / Self Care | Payer: Self-pay | Source: Ambulatory Visit | Attending: Internal Medicine

## 2012-03-01 DIAGNOSIS — I454 Nonspecific intraventricular block: Secondary | ICD-10-CM | POA: Diagnosis present

## 2012-03-01 HISTORY — PX: ESOPHAGOGASTRODUODENOSCOPY: SHX5428

## 2012-03-01 LAB — CBC WITH DIFFERENTIAL/PLATELET
Basophils Absolute: 0 10*3/uL (ref 0.0–0.1)
Basophils Relative: 0 % (ref 0–1)
Eosinophils Relative: 0 % (ref 0–5)
Lymphocytes Relative: 9 % — ABNORMAL LOW (ref 12–46)
MCHC: 33.7 g/dL (ref 30.0–36.0)
MCV: 87 fL (ref 78.0–100.0)
Monocytes Absolute: 0.2 10*3/uL (ref 0.1–1.0)
Neutro Abs: 3.5 10*3/uL (ref 1.7–7.7)
Platelets: 71 10*3/uL — ABNORMAL LOW (ref 150–400)
RDW: 14 % (ref 11.5–15.5)
WBC: 4.1 10*3/uL (ref 4.0–10.5)

## 2012-03-01 LAB — TYPE AND SCREEN
ABO/RH(D): A POS
Antibody Screen: NEGATIVE
Unit division: 0
Unit division: 0

## 2012-03-01 LAB — PROTEIN ELECTROPHORESIS, SERUM
Beta 2: 6 % (ref 3.2–6.5)
Beta Globulin: 6.5 % (ref 4.7–7.2)
M-Spike, %: NOT DETECTED g/dL
Total Protein ELP: 5.4 g/dL — ABNORMAL LOW (ref 6.0–8.3)

## 2012-03-01 LAB — BASIC METABOLIC PANEL
BUN: 56 mg/dL — ABNORMAL HIGH (ref 6–23)
Calcium: 9 mg/dL (ref 8.4–10.5)
Creatinine, Ser: 1.21 mg/dL — ABNORMAL HIGH (ref 0.50–1.10)
GFR calc Af Amer: 48 mL/min — ABNORMAL LOW (ref >90)

## 2012-03-01 LAB — GLUCOSE, CAPILLARY: Glucose-Capillary: 220 mg/dL — ABNORMAL HIGH (ref 70–99)

## 2012-03-01 SURGERY — EGD (ESOPHAGOGASTRODUODENOSCOPY)
Anesthesia: Moderate Sedation

## 2012-03-01 MED ORDER — MIDAZOLAM HCL 5 MG/ML IJ SOLN
INTRAMUSCULAR | Status: AC
  Filled 2012-03-01: qty 2

## 2012-03-01 MED ORDER — WARFARIN VIDEO
Freq: Once | Status: AC
  Administered 2012-03-02: 1

## 2012-03-01 MED ORDER — FUROSEMIDE 40 MG PO TABS
40.0000 mg | ORAL_TABLET | Freq: Every day | ORAL | Status: DC
  Filled 2012-03-01: qty 1

## 2012-03-01 MED ORDER — ENOXAPARIN SODIUM 80 MG/0.8ML ~~LOC~~ SOLN
70.0000 mg | Freq: Two times a day (BID) | SUBCUTANEOUS | Status: DC
  Administered 2012-03-01 – 2012-03-03 (×4): 70 mg via SUBCUTANEOUS
  Filled 2012-03-01 (×5): qty 0.8

## 2012-03-01 MED ORDER — SODIUM CHLORIDE 0.9 % IV SOLN: INTRAVENOUS | Status: DC

## 2012-03-01 MED ORDER — FENTANYL CITRATE 0.05 MG/ML IJ SOLN
INTRAMUSCULAR | Status: AC
  Filled 2012-03-01: qty 2

## 2012-03-01 MED ORDER — COUMADIN BOOK
Freq: Once | Status: AC
  Administered 2012-03-01: 12:00:00
  Filled 2012-03-01: qty 1

## 2012-03-01 MED ORDER — FERROUS SULFATE 325 (65 FE) MG PO TABS
325.0000 mg | ORAL_TABLET | Freq: Every day | ORAL | Status: DC
  Administered 2012-03-01: 325 mg via ORAL
  Filled 2012-03-01 (×3): qty 1

## 2012-03-01 MED ORDER — MIDAZOLAM HCL 10 MG/2ML IJ SOLN
INTRAMUSCULAR | Status: DC | PRN
  Administered 2012-03-01 (×2): 1 mg via INTRAVENOUS

## 2012-03-01 MED ORDER — WARFARIN SODIUM 5 MG PO TABS
5.0000 mg | ORAL_TABLET | Freq: Once | ORAL | Status: AC
  Administered 2012-03-01: 5 mg via ORAL
  Filled 2012-03-01: qty 1

## 2012-03-01 MED ORDER — METHYLPREDNISOLONE SODIUM SUCC 40 MG IJ SOLR
40.0000 mg | Freq: Three times a day (TID) | INTRAMUSCULAR | Status: DC
  Administered 2012-03-01 – 2012-03-02 (×2): 40 mg via INTRAVENOUS
  Filled 2012-03-01 (×5): qty 1

## 2012-03-01 MED ORDER — WARFARIN - PHARMACIST DOSING INPATIENT: Freq: Every day | Status: DC

## 2012-03-01 MED ORDER — FENTANYL CITRATE 0.05 MG/ML IJ SOLN
INTRAMUSCULAR | Status: DC | PRN
  Administered 2012-03-01 (×2): 10 ug via INTRAVENOUS

## 2012-03-01 MED ORDER — SODIUM CHLORIDE 0.9 % IV SOLN
INTRAVENOUS | Status: DC
  Administered 2012-03-01: 500 mL via INTRAVENOUS

## 2012-03-01 MED ORDER — ENOXAPARIN SODIUM 80 MG/0.8ML ~~LOC~~ SOLN
70.0000 mg | SUBCUTANEOUS | Status: AC
  Administered 2012-03-01: 70 mg via SUBCUTANEOUS
  Filled 2012-03-01: qty 0.8

## 2012-03-01 MED ORDER — CARVEDILOL 6.25 MG PO TABS
6.2500 mg | ORAL_TABLET | Freq: Two times a day (BID) | ORAL | Status: DC
  Administered 2012-03-01 – 2012-03-03 (×4): 6.25 mg via ORAL
  Filled 2012-03-01 (×6): qty 1

## 2012-03-01 MED ORDER — BUTAMBEN-TETRACAINE-BENZOCAINE 2-2-14 % EX AERO
INHALATION_SPRAY | CUTANEOUS | Status: DC | PRN
  Administered 2012-03-01: 2 via TOPICAL

## 2012-03-01 MED ORDER — PANTOPRAZOLE SODIUM 40 MG PO TBEC
40.0000 mg | DELAYED_RELEASE_TABLET | Freq: Every day | ORAL | Status: DC
  Administered 2012-03-01 – 2012-03-03 (×3): 40 mg via ORAL
  Filled 2012-03-01 (×3): qty 1

## 2012-03-01 MED ORDER — FUROSEMIDE 40 MG PO TABS
40.0000 mg | ORAL_TABLET | Freq: Two times a day (BID) | ORAL | Status: DC
  Administered 2012-03-02 – 2012-03-03 (×3): 40 mg via ORAL
  Filled 2012-03-01 (×5): qty 1

## 2012-03-01 NOTE — Progress Notes (Signed)
ANTICOAGULATION CONSULT NOTE - Initial Consult  Pharmacy Consult for Lovenox and Coumadin Indication: atrial fibrillation, LV thrombus  Allergies  Allergen Reactions  . Paroxetine     unknown    Patient Measurements: Height: 5\' 1"  (154.9 cm) Weight: 158 lb 1.1 oz (71.7 kg) (Bed scale.) IBW/kg (Calculated) : 47.8   Vital Signs: Temp: 97.9 F (36.6 C) (12/18 0948) Temp src: Oral (12/18 0948) BP: 120/47 mmHg (12/18 1110) Pulse Rate: 69  (12/18 0948)  Labs:  Basename 03/01/12 0510 02/29/12 2217 02/29/12 1143 02/29/12 0312 02/28/12 1543 02/28/12 0915 02/28/12 0600 02/27/12 2029  HGB 9.7* 10.2* -- -- -- -- -- --  HCT 28.8* 30.3* 25.5* -- -- -- -- --  PLT 71* 87* 95* -- -- -- -- --  APTT -- -- -- -- -- -- -- --  LABPROT -- -- -- -- -- -- -- 15.2  INR -- -- -- -- -- -- -- 1.22  HEPARINUNFRC -- -- -- -- -- -- -- --  CREATININE 1.21* -- -- 1.30* -- -- 1.10 --  CKTOTAL -- -- -- -- -- -- -- --  CKMB -- -- -- -- -- -- -- --  TROPONINI -- -- -- -- <0.30 <0.30 <0.30 --    Estimated Creatinine Clearance: 33.6 ml/min (by C-G formula based on Cr of 1.21).   Medical History: Past Medical History  Diagnosis Date  . Dementia, unspecified, with behavioral disturbance   . Alzheimer's disease   . Diabetes mellitus without complication   . Chronic kidney disease, stage III (moderate)   . Unspecified vitamin D deficiency   . Arthritis     osteo  . Hypertension   . Hyperlipidemia   . Depression   . GERD (gastroesophageal reflux disease)   . Cataract   . Exudative senile macular degeneration of retina   . Retinal neovascularization NOS   . Unspecified systolic heart failure   . Mild nonproliferative diabetic retinopathy(362.04)   . Atrial fibrillation   . Pure hypercholesterolemia   . Esophageal reflux   . Blood in stool   . Primary localized osteoarthrosis, lower leg   . Other chronic pain   . Lumbago   . Vitamin D deficiency   . Macular degeneration   . Chronic pain    back    Medications:  Prescriptions prior to admission  Medication Sig Dispense Refill  . acetaminophen (TYLENOL) 325 MG tablet Take 650 mg by mouth every 4 (four) hours as needed.      . Albuterol Sulfate (PROAIR HFA IN) Inhale 1 application into the lungs daily as needed. For shortness of breath      . Calcium Carbonate (CALTRATE 600 PO) Take by mouth daily.      . cholecalciferol (VITAMIN D) 1000 UNITS tablet Take 1,000 Units by mouth daily.      . citalopram (CELEXA) 20 MG tablet Take 20 mg by mouth daily.      . furosemide (LASIX) 40 MG tablet Take 40 mg by mouth daily.      Marland Kitchen HYDROcodone-acetaminophen (VICODIN) 5-500 MG per tablet Take 1 tablet by mouth every 6 (six) hours. For pain      . isosorbide mononitrate (IMDUR) 30 MG 24 hr tablet Take 30 mg by mouth daily.      Marland Kitchen lisinopril (PRINIVIL,ZESTRIL) 10 MG tablet Take 10 mg by mouth daily.      . Loperamide HCl (IMODIUM PO) Take 1 tablet by mouth daily as needed.       . lovastatin (  MEVACOR) 40 MG tablet Take 40 mg by mouth at bedtime.      . metoprolol tartrate (LOPRESSOR) 25 MG tablet Take 25 mg by mouth 2 (two) times daily.      . Multiple Vitamins-Minerals (OCUVITE PO) Take by mouth.      . nystatin cream (MYCOSTATIN) Apply 1 application topically daily as needed. For rash      . omeprazole (PRILOSEC) 20 MG capsule Take 20 mg by mouth daily.      . potassium chloride (K-DUR) 10 MEQ tablet Take 20 mEq by mouth 2 (two) times daily.      . pseudoephedrine (SUDAFED) 120 MG 12 hr tablet Take 120 mg by mouth every 12 (twelve) hours. For congestion      . triamcinolone cream (KENALOG) 0.5 % Apply topically 3 (three) times daily.        Assessment: 78 y/o female admitted 12/15 with respiratory distress. She has a hx Afib and recent echo found calcified fixed LV thrombus. Pharmacy consulted to begin Lovenox and Coumadin. Of note, patient recently had EGD to evaluate anemia and melena which revealed no active bleeding or ulceration. GI ok  with starting anticoagulation. No overt bleeding noted, she has a hx of chronic thrombocytopenia.  Goal of Therapy:  INR 2-3 Anti-Xa level 0.6-1.2 units/ml 4hrs after LMWH dose given Monitor platelets by anticoagulation protocol: Yes   Plan:  -Lovenox 70 mg SQ q12h -Coumadin 5 mg po tonight -Monitor for s/sx of bleeding -INR daily, CBC at least q72h while on Lovenox (daily already ordered) -Coumadin book/video  [facility], [place].D., BCPS Clinical Pharmacist Pager: [phone] 03/01/2012 11:40 AM

## 2012-03-01 NOTE — Progress Notes (Addendum)
Subjective: Patient has not noticed tarry stool in 2-3 days.  Breathing is improved though still wheezing.  Denies chest pain.  She wants to do anticoagulation if GI does not find source of bleeding to prevent stroke. She states she has some tenderness RLQ  Bradycardia noted overnight 40s x 5 minutes via tele.  Also been in junctional rhythm and 1st degree heart block  Objective: Vital signs in last 24 hours: Filed Vitals:   02/29/12 1831 02/29/12 1905 02/29/12 2038 03/01/12 0617  BP: 125/72 136/66 146/77 140/73  Pulse: 96 80 76 73  Temp: 98 F (36.7 C) 97.6 F (36.4 C) 97.6 F (36.4 C) 97.9 F (36.6 C)  TempSrc: Oral Oral Oral Oral  Resp: 22 20 19 22   Height:      Weight:    158 lb 1.1 oz (71.7 kg)  SpO2: 97%  99% 96%   Weight change: -7.1 oz (-0.2 kg)  Intake/Output Summary (Last 24 hours) at 03/01/12 0827 Last data filed at 03/01/12 0800  Gross per 24 hour  Intake 1104.67 ml  Output   2525 ml  Net -1420.33 ml   General:lying in bed arousable, nad, alert and oriented x 3 HEENT: [place]/at CV: RRR no rubs, murmurs, gallops Lungs: ant. Wheezing, mild crackles b/l lung fields posteriorly  Abdomen: +mild RLQ with palpation, soft, normal bs, other areas no ttp, nd Extremities: warm, no cyanosis or edema Neuro: CN 2-12 grossly intact, moving all 4 extremities  Lab Results: Basic Metabolic Panel:  Lab 03/01/12 4098 02/29/12 0312 02/28/12 0600  NA 136 130* --  K 4.2 4.1 --  CL 101 95* --  CO2 24 21 --  GLUCOSE 228* 179* --  BUN 56* 55* --  CREATININE 1.21* 1.30* --  CALCIUM 9.0 8.6 --  MG -- -- 2.0  PHOS -- -- 3.7   Liver Function Tests:  Lab 02/28/12 0600  AST 34  ALT 14  ALKPHOS 71  BILITOT 0.9  PROT 6.3  ALBUMIN 3.0*   CBC:  Lab 03/01/12 0510 02/29/12 2217 02/29/12 0312  WBC 4.1 5.4 --  NEUTROABS 3.5 -- 5.6  HGB 9.7* 10.2* --  HCT 28.8* 30.3* --  MCV 87.0 87.3 --  PLT 71* 87* --   Cardiac Enzymes:  Lab 02/28/12 1543 02/28/12 0915 02/28/12 0600   CKTOTAL -- -- --  CKMB -- -- --  CKMBINDEX -- -- --  TROPONINI <0.30 <0.30 <0.30   BNP:  Lab 02/27/12 2030  PROBNP 7174.0*   CBG:  Lab 03/01/12 0621 02/29/12 2052 02/29/12 1614 02/29/12 0606 02/28/12 2123 02/28/12 1614  GLUCAP 220* 328* 233* 214* 209* 176*   Coagulation:  Lab 02/27/12 2029  LABPROT 15.2  INR 1.22   Anemia Panel:  Lab 02/28/12 0029  VITAMINB12 672  FOLATE >20.0  FERRITIN 90  TIBC 291  IRON 20*  RETICCTPCT 1.7   Misc. Labs: none  Micro Results: Recent Results (from the past 240 hour(s))  MRSA PCR SCREENING     Status: Abnormal   Collection Time   02/27/12 11:38 PM      Component Value Range Status Comment   MRSA by PCR POSITIVE (*) NEGATIVE Final    Studies/Results: Dg Chest 2 View  02/29/2012  *RADIOLOGY REPORT*  Clinical Data: Worsening wheezing, shortness of breath.  CHEST - 2 VIEW  Comparison: 02/27/2012.  Findings: The heart, mediastinum and hilar contours remain normal. There is no evidence of focal alveolar consolidation.  The interstitium appears overall slightly prominent and is slightly  more prominent than on the previous study and as compared to earlier examinations.  No effusions.  Chronic changes involving the right shoulder and right clavicle again identified and stable.  No acute bony findings  IMPRESSION: Interstitial prominence is present and may be slightly worse than on the preceding study.  The possibility of a superimposed inflammatory process or even mild edema cannot be excluded.  No focal changes.   Original Report Authenticated By: Sander Radon, M.D.    Medications: Scheduled Meds:    . sodium chloride  500 mL Intravenous Once  . albuterol  2.5 mg Nebulization TID  . antiseptic oral rinse  15 mL Mouth Rinse BID  . carvedilol  6.25 mg Oral BID WC  . Chlorhexidine Gluconate Cloth  6 each Topical Q0600  . citalopram  20 mg Oral Daily  . docusate sodium  100 mg Oral BID  . doxycycline  100 mg Oral Q12H  . feeding  supplement  237 mL Oral BID BM  . furosemide  40 mg Oral QHS  . guaiFENesin  600 mg Oral BID  . insulin aspart  0-5 Units Subcutaneous QHS  . insulin aspart  0-9 Units Subcutaneous TID WC  . ipratropium  0.5 mg Nebulization TID  . isosorbide mononitrate  30 mg Oral Daily  . lisinopril  10 mg Oral Daily  . methylPREDNISolone (SOLU-MEDROL) injection  60 mg Intravenous Q6H  . mupirocin ointment  1 application Nasal BID  . pantoprazole (PROTONIX) IV  40 mg Intravenous Q12H  . simvastatin  20 mg Oral q1800  . sodium chloride  250 mL Intravenous Once  . sodium chloride  3 mL Intravenous Q12H  . sodium chloride  3 mL Intravenous Q12H   Continuous Infusions:  PRN Meds:.sodium chloride, acetaminophen, acetaminophen, albuterol, albuterol, HYDROcodone-acetaminophen, ondansetron (ZOFRAN) IV, ondansetron, sodium chloride, sodium chloride, traZODone Assessment/Plan:  1.Acute on chronic systolic and diastolic heart failure  -changed Lopressor to Coreg 6.25 mg bid per cards, continue ACEI and statin -daily wts (down 2 lbs), strict i/o (24 hours -1.6 L, net neg 3.4L since admission) -pending proBNP -Cardiology Dr. Allyson Sabal thought less likely ischemic cardiomyopathy.  This patient may be candidate for life vest, ICD, CRT (Cardiac resynchronization therapy) due to bundle branch block  2. Microcytic Anemia -Hemoglobin 7.4 on admission s/p 3 units of blood with hbg 9.7 this am -cbc in the am -[facility] consulted, following- upper endoscopy today with esophagogastric junction there was some erythema  noted. There was no evidence of active bleeding or obvious ulceration.  There was one focal area in the cardia of the stomach with some reddish mucosa which may be a little erosion; no specific findings to prevent anticoagulation.  Recommend PPI outpatient and Fe supplementation -negative fobt, Fe deficiency anemia noted-->started Fe 325 mg daily which may be increased by discharge  -Protonix  40 mg bid iv  changed to 40 mg orally dialy  -hbg goal >8  3. DYSPNEA  -etiology thought to be secondary to acute on chronic systolic failure versus pulmonary (i.e COPD exacerbation but no prior pfts on file vs bronchitis).  Patient is a former heavy smoker.  -Solumedrol 60 mg q6  -Continue Atrovent neb q 6 hours, prn Albuterol q4 prn inhaler and Albuterol nebulizer q2 prn -Doxycycline 100 mg bid (Day 3) for possible COPD exacerbation vs bronchitis  -Consider pft evaluation outpatient   4. Fixed thrombus noted in left ventricle with history of atrial fibrillation -patient also with history of atrial fibrillation and bradycardic atrial  fibrillation noted on echo.  CHADS score 4. She is not on any anticoagulation prior to admission but has been on in the past stopped due to GI bleeding -cards rec possibly restarting anticoagulation in the future -we added Lovenox per Pharm to bridge and also Coumadin per Pharmacy  5. Bundle branch block and 1st degree heart block -Cards considering CRT (Cardiac resynchronization therapy) due to bundle branch block  6. pancytopenia with chronic thrombocytopenia -pending SPEP/UPEP to w/u pancytopenia for MDS or MM.  Consider outpatient bone marrow biopsy or further w/u in the future for pancytopenia with heme/onc -plts 71 today. Baseline 100s  7. DM  -hyperglycemic 2/2 steroids 200s-300s -HA1C 6.3 -will monitor cbg -continue SSI   8. HYPERTENSION  -continue to monitor BP. Continue Imdur, Lisinopril, Lopressor changed to Coreg   9.MRSA nares -protocol  10. F/E/N -will monitor labs -diet ordered cardiac   11. DVT Px -Lovenox, coumadin    Dispo: Disposition is deferred at this time, awaiting improvement of current medical problems.  Anticipated discharge in approximately 1-2 day(s).   The patient does have a current PCP (Thane Edu, MD), therefore will be requiring OPC follow-up after discharge.   The patient does have transportation  limitations that hinder transportation to clinic appointments.  .Services Needed at time of discharge: Y = Yes, Blank = No PT:   OT: Y  RN:   Equipment:   Other:     LOS: 3 days   Annett Gula [phone] 03/01/2012, 8:27 AM  ------------------------------------------------------------------------------------------------------------------------------------- Internal Medicine Teaching Service Attending Note Date: 03/01/2012  Patient name: [name]  Medical record number: [id number]  Date of birth: March 12, 1930   I saw the patient today and reviewed the plan of care with my resident team.   Subjective: She feels much better per breathing and is wheezing less.   Objective: Filed Vitals:   03/01/12 1052 03/01/12 1100 03/01/12 1110 03/01/12 1300  BP: 118/39 117/41 120/47 137/72  Pulse:    80  Temp:    97.7 F (36.5 C)  TempSrc:    Oral  Resp: 20 22 34 22  Height:      Weight:      SpO2: 95% 95% 98% 96%    General: Resting in bed. HEENT: PERRL, EOMI, no scleral icterus. Hard of hearing. Heart: RRR, no rubs, murmurs or gallops. Lungs: Wheezes bilaterally, appear to be improved than before, rales present. Abdomen: Soft, nontender, nondistended, BS present. Extremities: Warm, no pedal edema. Neuro: Alert and oriented X3, grossly non-focal.  Relevant Labs  Lab 03/01/12 0510 02/29/12 2217 02/29/12 1143  HGB 9.7* 10.2* 8.5*  HCT 28.8* 30.3* 25.5*  WBC 4.1 5.4 8.8  PLT 71* 87* 95*    Lab 03/01/12 0510 02/29/12 0312 02/28/12 0600 02/27/12 2029  NA 136 130* 133* 131*  K 4.2 4.1 -- --  CL 101 95* 101 98  CO2 24 21 21 19   GLUCOSE 228* 179* 254* 187*  BUN 56* 55* 37* 35*  CREATININE 1.21* 1.30* 1.10 1.07  CALCIUM 9.0 8.6 8.9 8.9  MG -- -- 2.0 --  PHOS -- -- 3.7 --    Intake/Output Summary (Last 24 hours) at 03/01/12 1506 Last data filed at 03/01/12 0800  Gross per 24 hour  Intake 579.17 ml  Output   1500 ml  Net -920.83 ml     Lab 03/01/12 0510  02/27/12 2030  PROBNP 22899.0* 7174.0*   Weight change: -7.1 oz (-0.2 kg)   Recent Imaging Dg Chest 2  View  02/29/2012  *RADIOLOGY REPORT*  Clinical Data: Worsening wheezing, shortness of breath.  CHEST - 2 VIEW  Comparison: 02/27/2012.  Findings: The heart, mediastinum and hilar contours remain normal. There is no evidence of focal alveolar consolidation.  The interstitium appears overall slightly prominent and is slightly more prominent than on the previous study and as compared to earlier examinations.  No effusions.  Chronic changes involving the right shoulder and right clavicle again identified and stable.  No acute bony findings  IMPRESSION: Interstitial prominence is present and may be slightly worse than on the preceding study.  The possibility of a superimposed inflammatory process or even mild edema cannot be excluded.  No focal changes.   Original Report Authenticated By: Sander Radon, M.D.    Assessment and Plan Acute on chronic systolic CHF: Given the chest x ray and increase in BNP, we would increase lasix to 40 mg IV BID, and monitor intake and output. Clinically though, the patient appears well, is saturating good on 2 litres of oxygen. This perhaps might be because of improvement in COPD exacerbation. She has not gained any weight. We will monitor creatinine, which had improved when we had held lasix. This appears to be becoming more like cardio-renal syndrome. Cardiology on board for consult. With decreased EF, agree with change of BB from metoprolol to coreg to improve cardiac output. CRT would be beneficial. We will continue to monitor for arrhythmias.   COPD Exacerbation: Decrease solumedrol to 40 mg IV Q8 and then change to prednisone oral tomorrow if the patient continues to improve.  GI bleeding: EGD done. No significant source found. Ok to start anticoagulation if needed per GI.  LV thrombus: Per GI, there is no contraindication to anticoagulation. We will follow  cardiology recommendation. The patient agrees to being anti-coagulated.   Other chronic issues per resident note.   Thanks Aletta Edouard, MD 03/01/2012, 2:53 PM

## 2012-03-01 NOTE — Progress Notes (Signed)
Occupational Therapy Treatment Patient Details Name: [name] MRN: [id number] DOB: 1929/05/23 Today's Date: 03/01/2012 Time: 5956-3875 OT Time Calculation (min): 34 min  OT Assessment / Plan / Recommendation Comments on Treatment Session Excellent progress. Improved endurance for activities. Dyspnea during functional ambulation 2/4. O2 SATS 100% 2L. O2 removed. Pt @ 100% RA at rest. Nsg asked to spot check. Pt will be appriopriate for D/C home and resume PACE program when medically ready.    Follow Up Recommendations  Other (comment);Home health OT    Barriers to Discharge   none    Equipment Recommendations  None recommended by OT    Recommendations for Other Services    Frequency Min 3X/week   Plan Discharge plan remains appropriate    Precautions / Restrictions Precautions Precautions: Fall Restrictions Weight Bearing Restrictions: No   Pertinent Vitals/Pain No c/o pain. O2 SATS 100 2L. 97-100 RA    ADL  Grooming: Supervision/safety;Wash/dry face;Wash/dry hands;Performed;Brushing hair Where Assessed - Grooming: Unsupported standing Toilet Transfer: Min Pension scheme manager Method: Sit to stand;Stand pivot Acupuncturist: Bedside commode Toileting - Clothing Manipulation and Hygiene: Set up Where Assessed - Engineer, mining and Hygiene: Sit to stand from 3-in-1 or toilet;Standing Equipment Used: Gait belt;Rolling walker Transfers/Ambulation Related to ADLs: S RW level ADL Comments: improved endurance during ADL task with dyspnea 2/4. functional ambualtion @100  ft RW level dyspnea 2/4. O2 SATS 100 2L. O2 removed in room. Pt @100 % RA.  (Educated on breathing through nose)    OT Diagnosis:    OT Problem List:   OT Treatment Interventions:     OT Goals Acute Rehab OT Goals OT Goal Formulation: With patient Time For Goal Achievement: 03/14/12 Potential to Achieve Goals: Good ADL Goals Pt Will Perform Grooming: Standing at  sink;Unsupported;with supervision ADL Goal: Grooming - Progress: Met Pt Will Perform Lower Body Dressing: Unsupported;Sit to stand from chair;with supervision;with set-up ADL Goal: Lower Body Dressing - Progress: Progressing toward goals Pt Will Transfer to Toilet: Ambulation;with supervision;with DME;Other (comment) ADL Goal: Toilet Transfer - Progress: Progressing toward goals Pt Will Perform Toileting - Clothing Manipulation: with supervision;Standing ADL Goal: Toileting - Clothing Manipulation - Progress: Met Pt Will Perform Toileting - Hygiene: with supervision;Standing at 3-in-1/toilet ADL Goal: Toileting - Hygiene - Progress: Met Additional ADL Goal #1: Complete functional moiblity @ RW level for ADL with S  Visit Information  Last OT Received On: 03/01/12 Assistance Needed: +1    Subjective Data      Prior Functioning       Cognition  Overall Cognitive Status: History of cognitive impairments - at baseline Arousal/Alertness: Awake/alert Orientation Level: Appears intact for tasks assessed Behavior During Session: [facility] for tasks performed Cognition - Other Comments: Son reports mild "dementai"symptoms    Mobility  Shoulder Instructions Bed Mobility Bed Mobility: Supine to Sit;Sitting - Scoot to Edge of Bed Supine to Sit: 5: Supervision;HOB elevated;With rails Transfers Transfers: Sit to Stand;Stand to Sit Sit to Stand: 5: Supervision;With upper extremity assist;From bed Stand to Sit: 5: Supervision;With upper extremity assist;To chair/3-in-1 Details for Transfer Assistance: vc for safety       Exercises      Balance  S   End of Session OT - End of Session Equipment Utilized During Treatment: Gait belt Activity Tolerance: Patient tolerated treatment well Patient left: in chair;with call bell/phone within reach Nurse Communication: Mobility status;Other (comment) (spot check O2 SATS)  GO     [name],[name] 03/01/2012, 5:05 PM [name], OTR/L   [phone]  03/01/2012 

## 2012-03-01 NOTE — Op Note (Signed)
[facility] [address] [place], [address]   ENDOSCOPY PROCEDURE REPORT  PATIENT: Yvonne, [name]  MR#: [id number] BIRTHDATE: Apr 25, 1929 , 82  yrs. old GENDER: Female ENDOSCOPIST: Wandalee Ferdinand, MD REFERRED BY: PROCEDURE DATE:  03/01/2012 PROCEDURE:   EGD ASA CLASS: 3 INDICATIONS: melena, anemia MEDICATIONS: fentanyl 20 mcg IV, Versed 2 mg IV TOPICAL ANESTHETIC: Cetacaine spray to the oropharynx  DESCRIPTION OF PROCEDURE:   After the risks benefits and alternatives of the procedure were thoroughly explained, informed consent was obtained.  The Pentax Gastroscope [id number]  endoscope was introduced through the mouth and advanced to the second portion of the duodenum      , limited by Without limitations.   The instrument was slowly withdrawn as the mucosa was fully examined.      FINDINGS:  Esophagus: At the esophagogastric junction there was some erythema noted. There was no evidence of active bleeding or obvious ulceration.  Stomach: There was one focal area in the cardia of the stomach with some reddish mucosa which may be a little erosion. It did not look like an angiodysplasia. The rest of the mucosa in the stomach looked normal.  Duodenum: Normal  COMPLICATIONS:none  ENDOSCOPIC IMPRESSION:as above. No significant lesions seen in the upper GI tract.   RECOMMENDATIONS:recommend PPI therapy for chronic acid suppression. I see nothing specifically on this examination that would prevent anti-coagulation on this patient going forward as long as she remained on PPI therapy. She should be put on iron supplementation to help build up her H&H.  We will sign off. Call again if needed.   report is a consent working okay so since he had a   _______________________________ eSigned:  Wandalee Ferdinand, MD 03/01/2012 10:56 AM       PATIENT NAME:  [name], [name] MR#: [id number]

## 2012-03-01 NOTE — Progress Notes (Signed)
Inpatient Diabetes Program Recommendations  AACE/ADA: New Consensus Statement on Inpatient Glycemic Control (2013)  Target Ranges:  Prepandial:   less than 140 mg/dL      Peak postprandial:   less than 180 mg/dL (1-2 hours)      Critically ill patients:  140 - 180 mg/dL  Results for Yvonne, [name] (MRN [id number]) as of 03/01/2012 14:48  Ref. Range 02/29/2012 06:06 02/29/2012 16:14 02/29/2012 20:52 03/01/2012 06:21 03/01/2012 11:45  Glucose-Capillary Latest Range: 70-99 mg/dL 409 (H) 811 (H) 914 (H) 220 (H) 220 (H)   Inpatient Diabetes Program Recommendations Insulin - Basal: add Lantus 10 units during steroid therapy Will follow. Thank you  Piedad Climes [facility] Inpatient Diabetes Coordinator [phone]

## 2012-03-01 NOTE — Progress Notes (Signed)
Patient's HR sustained in the 40s for approximately 5 minutes.  Patient was awakened from sleep and is asymptomatic.  BP 127/77.  MD notified.  No new orders given.  RN will continue to monitor the patient.

## 2012-03-01 NOTE — Progress Notes (Signed)
[facility] DAILY PROGRESS NOTE  NAME:  [name]   MRN: [id number] DOB:  Jul 16, 1929   ADMIT DATE: 02/27/2012   Patient Description   78 y.o. female with PMH below: presented with what appeared to be an upper respiratory tract infection and worsening heart failure. Found to have significantly reduced ejection fraction echocardiogram in 2 days ago. Has had a relatively aggressive diuresis.   Past Medical History  Diagnosis Date  . Dementia, unspecified, with behavioral disturbance   . Alzheimer's disease   . Diabetes mellitus without complication   . Chronic kidney disease, stage III (moderate)   . Unspecified vitamin D deficiency   . Arthritis     osteo  . Hypertension   . Hyperlipidemia   . Depression   . GERD (gastroesophageal reflux disease)   . Cataract   . Exudative senile macular degeneration of retina   . Retinal neovascularization NOS   . Unspecified systolic heart failure   . Mild nonproliferative diabetic retinopathy(362.04)   . Atrial fibrillation   . Pure hypercholesterolemia   . Esophageal reflux   . Blood in stool   . Primary localized osteoarthrosis, lower leg   . Other chronic pain   . Lumbago   . Vitamin D deficiency   . Macular degeneration   . Chronic pain     back    Clinical Course: Length of Stay:  LOS: 3 days    Subjective:   Today [name] was resting quite well my evaluation. She is audibly wheezing with almost upper restoration prior study breathing but does not seem having increased work of breathing.   She does that she got something to help her sleep last night which finally made to feel better again the mid part with part of her decompensation was due to poor rest.  OBjective:  Temp:  [97.6 F (36.4 C)-98.3 F (36.8 C)] 97.7 F (36.5 C) (12/18 1300) Pulse Rate:  [69-96] 80  (12/18 1300) Resp:  [16-34] 22  (12/18 1300) BP: (92-146)/(39-81) 137/72 mmHg (12/18 1300) SpO2:  [93 %-100 %] 96  % (12/18 1300) Weight:  [71.7 kg (158 lb 1.1 oz)] 71.7 kg (158 lb 1.1 oz) (12/18 0617) Weight change: -0.2 kg (-7.1 oz) Physical Exam: General appearance: alert, cooperative, no distress and very hard of hearing Neck: JVD - Less than 1 cm above sternal notch, no adenopathy, no carotid bruit and supple, symmetrical, trachea midline Lungs: diminished breath sounds bilaterally and Diffuse expiratory rhonchorous and wheezing as well as interstitial sounds are no evidence of rales Heart: Mostly regular rate and rhythm with occasional ectopy. No murmurs rubs or gallops noted, however the activity could be masking an S4 gallop no S3 noted Abdomen: soft, non-tender; bowel sounds normal; no masses,  no organomegaly and Mildly obese Extremities: extremities normal, atraumatic, no cyanosis or edema Pulses: 2+ and symmetric Neurologic: Grossly normal  Intake/Output from previous day: 12/17 0701 - 12/18 0700 In: 1104.7 [P.O.:600; I.V.:156; Blood:346.7; IV Piggyback:2] Out: 2750 [Urine:2750]  Intake/Output Summary (Last 24 hours) at 03/01/12 1517 Last data filed at 03/01/12 0800  Gross per 24 hour  Intake 579.17 ml  Output   1500 ml  Net -920.83 ml    Lab 03/01/12 0510 02/29/12 0312 02/28/12 0600  NA 136 130* 133*  K 4.2 4.1 4.9  CL 101 95* 101  CO2 24 21 21   BUN 56* 55* 37*  CREATININE 1.21* 1.30* 1.10     Lab 03/01/12 0510 02/29/12  2217 02/29/12 1143  WBC 4.1 5.4 8.8  HGB 9.7* 10.2* 8.5*  HCT 28.8* 30.3* 25.5*  PLT 71* 87* 95*   No components found with this basename: TROP:3, BNP:3   Lab 02/28/12 1543 02/28/12 0915 02/28/12 0600 02/27/12 2030  TROPONINI <0.30 <0.30 <0.30 <0.30   Telemetry: Mostly sinus rhythm with intermittent bursts of atrial fibrillation versus PAT but no tachyarrhythmia Imaging: no new imaging -- CXR yesterday personally reviewed, has appearance of increased pulmonary venous congestion.  MAR Reviewed  Assessment/Plan:  Principal Problem:  *Systolic CHF,  acute on chronic (worsening LV function) EF of 20-25% on echo (12/16) Active Problems:  DM  HYPERLIPIDEMIA  HYPERTENSION  Combined systolic and diastolic heart failure  DYSPNEA  Microcytic anemia  MRSA carrier (nares)  Hyponatremia  Thrombus  Ischemic cardiomyopathy  History of atrial fibrillation  Pancytopenia  COPD (chronic obstructive pulmonary disease)  BBB (bundle branch block)  Clinically she does not appear to be volume overloaded however, BNP is dramatically elevated compared to remission the 3 times admission level. This is despite diuresis at least 2 L and her breathing symptomatic symptomatically much improved. I question the validity of this lab value -- consider rechecking a day or so. Improved renal function with suggestion she is not over diuresed.  Complex situation with clearly baseline COPD along with significantly reduced ejection fraction thought to be nonischemic cardiomyopathy, however the echo does suggest some regional wall motion abnormalities.  She seemed to be doing relatively fine tolerating the carvedilol, however, depending on the severity of her COPD, we may need to switch back to a more selective beta 2 antagonist such as metoprolol succinate or bisoprolol.   I agree that clinically she does appear to be euvolemic, however x-ray yesterday did look like there may be some point vascular congestion. We will not truly know if she is symptomatic and is up walking around to determine if her dyspnea is improved.  With new onset significant reduction in the EF with conduction abnormality the question of cardiac resynchronization therapy (see RT) was suggested yesterday with BiV ICD.  Relatively low likelihood of ischemic cardiomyopathy however there were some wall motion abnormalities on echo. If ICD would be to consider did, would defer this decision to Dr. Royann Shivers. Would most likely recommend diagnostic catheterization prior to proceeding down this road. If  catheterization is considered, would do right and left heart catheterization 2 better determine her volume status for further therapy.  Another possibility is a this acute exacerbation of her heart failure/ejection fraction could be stress-induced secondary to COPD exacerbation. Prior to proceeding directly to ICD therapy with would potentially consider short-term bridging with LIfeVest.  For now would keep on oral Lasix to avoid excessive volume overload.  GI evaluation is not an active bleeding source. Findings suggests no clear cut reason to not anticoagulate. She is currently out now started on warfarin. Therefore if we were going to consider cardiac catheterization, would want to proceed before she becomes therapeutic. One other consideration would be to use a NOAC such as Eliquis which has a higher safety profile for GI bleeding then Coumadin and provide image more stable protection.  Blood pressure and heart rate or a relatively stable. Continue with current beta blocker and ACE inhibitor regimen.    Time Spent Directly with Patient:  15 minutes   HARDING,DAVID W, M.D., M.S. [facility] [address] [place], [place]  [address]  [phone]  03/01/2012 3:17 PM

## 2012-03-02 ENCOUNTER — Encounter (HOSPITAL_COMMUNITY): Payer: Self-pay | Admitting: Gastroenterology

## 2012-03-02 DIAGNOSIS — Z7901 Long term (current) use of anticoagulants: Secondary | ICD-10-CM

## 2012-03-02 LAB — UIFE/LIGHT CHAINS/TP QN, 24-HR UR
Alpha 1, Urine: DETECTED — AB
Alpha 2, Urine: DETECTED — AB
Free Kappa Lt Chains,Ur: 1.66 mg/dL (ref 0.14–2.42)
Gamma Globulin, Urine: DETECTED — AB

## 2012-03-02 LAB — BASIC METABOLIC PANEL
BUN: 57 mg/dL — ABNORMAL HIGH (ref 6–23)
CO2: 24 mEq/L (ref 19–32)
Calcium: 8.9 mg/dL (ref 8.4–10.5)
Creatinine, Ser: 1.06 mg/dL (ref 0.50–1.10)
GFR calc non Af Amer: 48 mL/min — ABNORMAL LOW (ref >90)
Glucose, Bld: 205 mg/dL — ABNORMAL HIGH (ref 70–99)
Sodium: 138 mEq/L (ref 135–145)

## 2012-03-02 LAB — CBC WITH DIFFERENTIAL/PLATELET
Basophils Relative: 0 % (ref 0–1)
Eosinophils Absolute: 0 10*3/uL (ref 0.0–0.7)
Eosinophils Relative: 0 % (ref 0–5)
Lymphs Abs: 0.3 10*3/uL — ABNORMAL LOW (ref 0.7–4.0)
MCH: 29.1 pg (ref 26.0–34.0)
MCHC: 32.9 g/dL (ref 30.0–36.0)
MCV: 88.7 fL (ref 78.0–100.0)
Platelets: 77 10*3/uL — ABNORMAL LOW (ref 150–400)
RBC: 3.26 MIL/uL — ABNORMAL LOW (ref 3.87–5.11)

## 2012-03-02 LAB — GLUCOSE, CAPILLARY
Glucose-Capillary: 167 mg/dL — ABNORMAL HIGH (ref 70–99)
Glucose-Capillary: 231 mg/dL — ABNORMAL HIGH (ref 70–99)

## 2012-03-02 LAB — PROTIME-INR: Prothrombin Time: 16.3 seconds — ABNORMAL HIGH (ref 11.6–15.2)

## 2012-03-02 MED ORDER — FERROUS SULFATE 325 (65 FE) MG PO TABS
325.0000 mg | ORAL_TABLET | Freq: Every day | ORAL | Status: DC
  Administered 2012-03-02 – 2012-03-03 (×2): 325 mg via ORAL
  Filled 2012-03-02 (×2): qty 1

## 2012-03-02 MED ORDER — METHYLPREDNISOLONE SODIUM SUCC 40 MG IJ SOLR
40.0000 mg | Freq: Two times a day (BID) | INTRAMUSCULAR | Status: DC
  Administered 2012-03-02 – 2012-03-03 (×2): 40 mg via INTRAVENOUS
  Filled 2012-03-02 (×4): qty 1

## 2012-03-02 MED ORDER — ENSURE COMPLETE PO LIQD: 237.0000 mL | Freq: Two times a day (BID) | ORAL | Status: DC

## 2012-03-02 MED ORDER — GLUCERNA SHAKE PO LIQD: 237.0000 mL | Freq: Three times a day (TID) | ORAL | Status: DC

## 2012-03-02 MED ORDER — WARFARIN SODIUM 5 MG PO TABS
5.0000 mg | ORAL_TABLET | Freq: Once | ORAL | Status: AC
  Administered 2012-03-02: 5 mg via ORAL
  Filled 2012-03-02: qty 1

## 2012-03-02 MED ORDER — FUROSEMIDE 10 MG/ML IJ SOLN
40.0000 mg | Freq: Once | INTRAMUSCULAR | Status: AC
  Administered 2012-03-02: 40 mg via INTRAVENOUS
  Filled 2012-03-02: qty 4

## 2012-03-02 MED ORDER — GLUCERNA SHAKE PO LIQD: 237.0000 mL | Freq: Two times a day (BID) | ORAL | Status: DC

## 2012-03-02 MED ORDER — SPIRONOLACTONE 25 MG PO TABS
25.0000 mg | ORAL_TABLET | Freq: Every day | ORAL | Status: DC
  Administered 2012-03-02 – 2012-03-03 (×2): 25 mg via ORAL
  Filled 2012-03-02 (×2): qty 1

## 2012-03-02 MED ORDER — FERROUS SULFATE 325 (65 FE) MG PO TABS: 325.0000 mg | ORAL_TABLET | Freq: Every day | ORAL | Status: DC

## 2012-03-02 MED ORDER — INSULIN GLARGINE 100 UNIT/ML ~~LOC~~ SOLN
10.0000 [IU] | Freq: Every day | SUBCUTANEOUS | Status: DC
  Administered 2012-03-02: 10 [IU] via SUBCUTANEOUS

## 2012-03-02 NOTE — ED Provider Notes (Signed)
I saw and evaluated the patient, reviewed the resident's note and I agree with the findings and plan.  Yaphet Smethurst T Brenton Joines, MD 03/02/12 0339 

## 2012-03-02 NOTE — Progress Notes (Signed)
Subjective:  Patient reports that this is the best she has felt since admission. She has been without supplemental O2 since last PM, and she is resting comfortably. No complaints.   Objective:  Vital Signs in the last 24 hours: Temp:  [97.2 F (36.2 C)-97.9 F (36.6 C)] 97.9 F (36.6 C) (12/19 0434) Pulse Rate:  [58-80] 58  (12/19 0614) Resp:  [16-34] 20  (12/19 0434) BP: (92-137)/(39-82) 122/82 mmHg (12/19 0614) SpO2:  [93 %-98 %] 96 % (12/19 0805) Weight:  [70.6 kg (155 lb 10.3 oz)] 70.6 kg (155 lb 10.3 oz) (12/19 0434)  Intake/Output from previous day:  Intake/Output Summary (Last 24 hours) at 03/02/12 0924 Last data filed at 03/02/12 0834  Gross per 24 hour  Intake    600 ml  Output   1475 ml  Net   -875 ml    Physical Exam: General appearance: alert, cooperative, no distress and w/o nasal canula Lungs: expiratory wheezes/rhonchi throughout bilateral lung fields, no rales noted Heart: regular rate and rhythm and slightly distant heart sounds, 2/6 systolic murmur best heard at LSB Extremities: no LEE Pulses: 2+ and symmetric Skin: cool but dry Neurologic: Grossly normal   Rate: 60  Rhythm: normal sinus rhythm  Lab Results:  Basename 03/02/12 0500 03/01/12 0510  WBC 4.7 4.1  HGB 9.5* 9.7*  PLT 77* 71*    Basename 03/02/12 0500 03/01/12 0510  NA 138 136  K 4.2 4.2  CL 105 101  CO2 24 24  GLUCOSE 205* 228*  BUN 57* 56*  CREATININE 1.06 1.21*    Basename 02/28/12 1543  TROPONINI <0.30   Hepatic Function Panel No results found for this basename: PROT,ALBUMIN,AST,ALT,ALKPHOS,BILITOT,BILIDIR,IBILI in the last 72 hours No results found for this basename: CHOL in the last 72 hours  Basename 03/02/12 0500  INR 1.34    Imaging: Imaging results have been reviewed  Cardiac Studies:  Assessment/Plan:   Principal Problem:  *Systolic CHF, acute on chronic (worsening LV function) EF of 20-25% on echo (12/16)  Active Problems:  DYSPNEA  Microcytic  anemia, endoscopy negative for PUD 03/01/12  Thrombus, Ca++ LV thrombus on echo  COPD (chronic obstructive pulmonary disease), on steroids  Warfarin anticoagulation, resumed after endoscopy this admission  DM  HYPERLIPIDEMIA  HYPERTENSION  MRSA carrier (nares)  History of atrial fibrillation  Pancytopenia  BBB (bundle branch block)    Plan: Pt looks much better today. Currently no supplemental O2 requirements. Last SpO2 was 96% on room air. No rales heard on lung exam. No edema noted.  Recheck BNP today. BP and HR stable. Cr has improved from yesterday and is WNL at 1.06 (1.21 yesterday)  ? the need for RHC/LHC (see yesterday's note by Dr. Herbie Baltimore). Continue with current medication regimen. MD to follow with further recommendation.    Corine Shelter PA-C 03/02/2012, 9:24 AM   I have seen and examined the patient along with Corine Shelter PA-C.  I have reviewed the chart, notes and new data.  I agree with PA's note.  Key new complaints: breathing improved, but not yet back to baseline Key examination changes: no rales,but she does have a few wheezes Key new findings / data: BNP is better, but still well above baseline  PLAN: Had a lengthy discussion re: advanced CHF management with Sandrina and her son Theressa Millard. I think they do not want and ICD, and therefore a LifeVest would also be contraindicated. At her age and with her comorbidities, this is reasonable.  She has non-ischemic ischemic  cardiomyopathy - no significant CAD by a recent cath. She is still hypervolemic, renal function has improved and I think we should continue diuresis. Add spironolactone and stop K supplement.  One might consider a CRT-P device as a measure that would impact quality of life. She has female gender, IVCD with LBBB morphology, non-ischemic cardiomyopathy and a QRS duration of 150 ms.and should be a CRT-responder. The patient and her son will discuss this tonight. If they are interested we will ask for a EP  evaluation and I have told Gypsy Balsam that a consult may be forthcoming tomorrow.  Thurmon Fair, MD, [facility] [phone] 03/02/2012, 4:24 PM

## 2012-03-02 NOTE — Progress Notes (Signed)
SATURATION QUALIFICATIONS: (This note is used to comply with regulatory documentation for home oxygen)  Patient Saturations on Room Air at Rest = 95%  Patient Saturations on Room Air while Ambulating = 92-24%  Patient Saturations on Liters of oxygen while Ambulating =N/A  Please briefly explain why patient needs home oxygen:  Ambulated 159ft on room air. Pt lowest O2 was 92%  Mostly 93-94%. Pt tolerated well. Pt still has wheezing.

## 2012-03-02 NOTE — Progress Notes (Addendum)
ANTICOAGULATION CONSULT NOTE - Follow Up Consult  Pharmacy Consult for  Coumadin and lovenox Indication: atrial fibrillation and LV thrombus  Allergies  Allergen Reactions  . Paroxetine     unknown    Patient Measurements: Height: 5\' 1"  (154.9 cm) Weight: 155 lb 10.3 oz (70.6 kg) IBW/kg (Calculated) : 47.8    Vital Signs: Temp: 97.9 F (36.6 C) (12/19 0434) Temp src: Oral (12/19 0434) BP: 122/82 mmHg (12/19 0614) Pulse Rate: 58  (12/19 0614)  Labs:  Basename 03/02/12 0500 03/01/12 0510 02/29/12 2217 02/29/12 0312 02/28/12 1543 02/28/12 0915  HGB 9.5* 9.7* -- -- -- --  HCT 28.9* 28.8* 30.3* -- -- --  PLT 77* 71* 87* -- -- --  APTT -- -- -- -- -- --  LABPROT 16.3* -- -- -- -- --  INR 1.34 -- -- -- -- --  HEPARINUNFRC -- -- -- -- -- --  CREATININE 1.06 1.21* -- 1.30* -- --  CKTOTAL -- -- -- -- -- --  CKMB -- -- -- -- -- --  TROPONINI -- -- -- -- <0.30 <0.30    Estimated Creatinine Clearance: 38 ml/min (by C-G formula based on Cr of 1.06).   Medications:  Scheduled:    . sodium chloride  500 mL Intravenous Once  . albuterol  2.5 mg Nebulization TID  . antiseptic oral rinse  15 mL Mouth Rinse BID  . carvedilol  6.25 mg Oral BID WC  . Chlorhexidine Gluconate Cloth  6 each Topical Q0600  . citalopram  20 mg Oral Daily  . [COMPLETED] coumadin book   Does not apply Once  . docusate sodium  100 mg Oral BID  . doxycycline  100 mg Oral Q12H  . [COMPLETED] enoxaparin (LOVENOX) injection  70 mg Subcutaneous NOW  . enoxaparin (LOVENOX) injection  70 mg Subcutaneous Q12H  . feeding supplement  237 mL Oral BID BM  . ferrous sulfate  325 mg Oral Q lunch  . furosemide  40 mg Oral BID  . guaiFENesin  600 mg Oral BID  . insulin aspart  0-5 Units Subcutaneous QHS  . insulin aspart  0-9 Units Subcutaneous TID WC  . insulin glargine  10 Units Subcutaneous QHS  . ipratropium  0.5 mg Nebulization TID  . isosorbide mononitrate  30 mg Oral Daily  . lisinopril  10 mg Oral Daily   . methylPREDNISolone (SOLU-MEDROL) injection  40 mg Intravenous Q8H  . mupirocin ointment  1 application Nasal BID  . pantoprazole  40 mg Oral Daily  . simvastatin  20 mg Oral q1800  . sodium chloride  250 mL Intravenous Once  . sodium chloride  3 mL Intravenous Q12H  . sodium chloride  3 mL Intravenous Q12H  . [COMPLETED] warfarin  5 mg Oral ONCE-1800  . warfarin   Does not apply Once  . Warfarin - Pharmacist Dosing Inpatient   Does not apply q1800  . [DISCONTINUED] ferrous sulfate  325 mg Oral Q breakfast  . [DISCONTINUED] furosemide  40 mg Oral QHS  . [DISCONTINUED] methylPREDNISolone (SOLU-MEDROL) injection  60 mg Intravenous Q6H  . [DISCONTINUED] pantoprazole (PROTONIX) IV  40 mg Intravenous Q12H    Assessment: 78 y.o female  on Lovenox and coumadin per pharmacy for  history of atrial fibrillation and recent ECHO findings of LV thrombus. Of note, patient recently had EGD to evaluate anemia and melena which revealed no active bleeding or ulceration. GI ok with starting anticoagulation. No overt bleeding noted. INR today 1.34, CBC stable.  Goal  of Therapy:  INR 2-3 Monitor platelets by anticoagulation protocol: Yes   Plan:  - Continue Lovenox 70 mg SQ q12h  -Coumadin 5 mg po tonight x1 -Monitor for s/sx of bleeding  -INR and CBC daily  Bola A. Wandra Feinstein D Clinical Pharmacist Pager:[phone] Phone [phone] 03/02/2012 8:45 AM

## 2012-03-02 NOTE — Progress Notes (Addendum)
Subjective: Patient is still wheezing, denies sob, chest pain, tarry stools. She is agreeable to cathertization  Objective: Vital signs in last 24 hours: Filed Vitals:   03/02/12 0434 03/02/12 0614 03/02/12 0805 03/02/12 0959  BP: 127/60 122/82  138/75  Pulse: 59 58  68  Temp: 97.9 F (36.6 C)     TempSrc: Oral     Resp: 20   18  Height:      Weight: 155 lb 10.3 oz (70.6 kg)     SpO2: 96%  96% 96%   Weight change: -2 lb 6.8 oz (-1.1 kg)  Intake/Output Summary (Last 24 hours) at 03/02/12 1006 Last data filed at 03/02/12 0834  Gross per 24 hour  Intake    600 ml  Output   1475 ml  Net   -875 ml   General:lying in bed, awake reading the paper, nad, alert and oriented x 3 HEENT: [place]/at CV: RRR no rubs, murmurs, gallops Lungs: ant. Wheezing, b/l wheezing posteriorly  Abdomen: +mild RUQ and LUQ ttp, soft, normal bs, other areas no ttp, nd Extremities: warm, no cyanosis or edema Neuro: CN 2-12 grossly intact, moving all 4 extremities  Lab Results: Basic Metabolic Panel:  Lab 03/02/12 0454 03/01/12 0510 02/28/12 0600  NA 138 136 --  K 4.2 4.2 --  CL 105 101 --  CO2 24 24 --  GLUCOSE 205* 228* --  BUN 57* 56* --  CREATININE 1.06 1.21* --  CALCIUM 8.9 9.0 --  MG -- -- 2.0  PHOS -- -- 3.7   Liver Function Tests:  Lab 02/28/12 0600  AST 34  ALT 14  ALKPHOS 71  BILITOT 0.9  PROT 6.3  ALBUMIN 3.0*   CBC:  Lab 03/02/12 0500 03/01/12 0510  WBC 4.7 4.1  NEUTROABS 4.0 3.5  HGB 9.5* 9.7*  HCT 28.9* 28.8*  MCV 88.7 87.0  PLT 77* 71*   Cardiac Enzymes:  Lab 02/28/12 1543 02/28/12 0915 02/28/12 0600  CKTOTAL -- -- --  CKMB -- -- --  CKMBINDEX -- -- --  TROPONINI <0.30 <0.30 <0.30   BNP:  Lab 03/01/12 0510 02/27/12 2030  PROBNP 22899.0* 7174.0*   CBG:  Lab 03/02/12 0606 03/01/12 2050 03/01/12 1634 03/01/12 1145 03/01/12 0621 02/29/12 2052  GLUCAP 194* 366* 290* 220* 220* 328*   Coagulation:  Lab 03/02/12 0500 02/27/12 2029  LABPROT 16.3* 15.2  INR  1.34 1.22   Anemia Panel:  Lab 02/28/12 0029  VITAMINB12 672  FOLATE >20.0  FERRITIN 90  TIBC 291  IRON 20*  RETICCTPCT 1.7   Misc. Labs: none  Micro Results: Recent Results (from the past 240 hour(s))  MRSA PCR SCREENING     Status: Abnormal   Collection Time   02/27/12 11:38 PM      Component Value Range Status Comment   MRSA by PCR POSITIVE (*) NEGATIVE Final    Studies/Results: Dg Chest 2 View  02/29/2012  *RADIOLOGY REPORT*  Clinical Data: Worsening wheezing, shortness of breath.  CHEST - 2 VIEW  Comparison: 02/27/2012.  Findings: The heart, mediastinum and hilar contours remain normal. There is no evidence of focal alveolar consolidation.  The interstitium appears overall slightly prominent and is slightly more prominent than on the previous study and as compared to earlier examinations.  No effusions.  Chronic changes involving the right shoulder and right clavicle again identified and stable.  No acute bony findings  IMPRESSION: Interstitial prominence is present and may be slightly worse than on the preceding study.  The possibility of a superimposed inflammatory process or even mild edema cannot be excluded.  No focal changes.   Original Report Authenticated By: Sander Radon, M.D.    Medications: Scheduled Meds:    . sodium chloride  500 mL Intravenous Once  . albuterol  2.5 mg Nebulization TID  . antiseptic oral rinse  15 mL Mouth Rinse BID  . carvedilol  6.25 mg Oral BID WC  . Chlorhexidine Gluconate Cloth  6 each Topical Q0600  . citalopram  20 mg Oral Daily  . docusate sodium  100 mg Oral BID  . doxycycline  100 mg Oral Q12H  . enoxaparin (LOVENOX) injection  70 mg Subcutaneous Q12H  . feeding supplement  237 mL Oral BID BM  . ferrous sulfate  325 mg Oral Q lunch  . furosemide  40 mg Oral BID  . guaiFENesin  600 mg Oral BID  . insulin aspart  0-5 Units Subcutaneous QHS  . insulin aspart  0-9 Units Subcutaneous TID WC  . insulin glargine  10 Units  Subcutaneous QHS  . ipratropium  0.5 mg Nebulization TID  . isosorbide mononitrate  30 mg Oral Daily  . lisinopril  10 mg Oral Daily  . methylPREDNISolone (SOLU-MEDROL) injection  40 mg Intravenous Q12H  . mupirocin ointment  1 application Nasal BID  . pantoprazole  40 mg Oral Daily  . simvastatin  20 mg Oral q1800  . sodium chloride  250 mL Intravenous Once  . sodium chloride  3 mL Intravenous Q12H  . sodium chloride  3 mL Intravenous Q12H  . warfarin  5 mg Oral ONCE-1800  . warfarin   Does not apply Once  . Warfarin - Pharmacist Dosing Inpatient   Does not apply q1800   Continuous Infusions:  PRN Meds:.sodium chloride, acetaminophen, acetaminophen, albuterol, albuterol, HYDROcodone-acetaminophen, ondansetron (ZOFRAN) IV, ondansetron, sodium chloride, sodium chloride, traZODone Assessment/Plan:  1.Acute on chronic systolic and diastolic heart failure  -EF now 20-25 % with grade 1 diastolic dysfunction down from EF 40-45% -changed Lopressor to Coreg 6.25 mg bid per cards, continue ACEI and statin -daily wts (down 5 lbs), strict i/o (24 hours -0.835 L, net neg 4.3 L since admission) -pending proBNP repeat today as cards thinks yesterday's was falsely elevated  -cards considering left and right heart cath.  They think she had non ischemic cardiomyopathy -per cards note considering changing Coreg back to Metoprolol succinate or Bisoprolol due to possible COPD -Cards referred to Dr. Royann Shivers for ICD but considering life vest until ICD.  Patient is agreeable to interventions -Cardiology deems possible COPD contributing to heart failure.    2. Microcytic Anemia -Hemoglobin 7.4 on admission s/p 3 units of blood with hbg 9.5; hbg goal >8 -[facility] consulted, following- upper endoscopy with esophagogastric junction there was some erythema  noted. There was no evidence of active bleeding or obvious ulceration.  There was one focal area in the cardia of the stomach with some reddish mucosa which  may be a little erosion; no specific findings to prevent anticoagulation.  Recommend PPI outpatient and Fe supplementation -negative fobt, Fe deficiency anemia noted -Continue Fe 325 mg daily  -Protonix  40 mg orally dialy    3. DYSPNEA  -resolved and improved -etiology thought to be multifactorial secondary to acute on chronic systolic and diastolic failure and pulmonary (i.e COPD exacerbation versus bronchitis (but no prior pfts on file).  Patient is a former heavy smoker.  -Continue Albuterol neb bid, prn Albuterol q4 prn inhaler and Albuterol  nebulizer q2 prn -Doxycycline 100 mg bid (Day 4) for possible COPD exacerbation  -Consider pft evaluation outpatient  -Solumedrol 40 mg q8 to 40 q12 consider Prednisone tomorrow.    4. Fixed thrombus noted in left ventricle with history of atrial fibrillation -Continue Lovenox and Coumadin per pharm. INR 1.34 today  5. Bundle branch block and 1st degree heart block -Cards considering CRT (Cardiac resynchronization therapy) due to bundle branch block  6. pancytopenia with chronic thrombocytopenia -SPEP nonspecific findings that could be associated with acute inflammatory response.  Pending UPEP.  Consider outpatient bone marrow biopsy or further w/u in the future for pancytopenia with heme/onc -plts 77 today. Baseline 100s  7. DM  -hyperglycemic 2/2 steroids -HA1C 6.3 -will monitor cbg -continue SSI, added Lantus 10 units qhs  8. HYPERTENSION  -controlled; continue to monitor BP. Continue Imdur, Lisinopril, Coreg   9.MRSA nares -protocol  10. F/E/N -will monitor labs -diet ordered cardiac   11. DVT Px -Lovenox, coumadin    Dispo: Disposition is deferred at this time, awaiting improvement of current medical problems.  Anticipated discharge in approximately 2-3 day(s).   The patient does have a current PCP (Thane Edu, MD), therefore will be requiring OPC follow-up after discharge.   The patient does have  transportation limitations that hinder transportation to clinic appointments.  .Services Needed at time of discharge: Y = Yes, Blank = No PT:   OT: Y  RN:   Equipment:   Other:     LOS: 4 days   Annett Gula [phone] 03/02/2012, 10:06 AM   -------------------------------------------------------------------------------------------------- Internal Medicine Teaching Service Attending Note Date: 03/02/2012  Patient name: [name]  Medical record number: [id number]  Date of birth: March 01, 1930   I saw the patient today and reviewed the plan of care with my resident team.   Subjective: [name] appears to be doing very well today. She does not seem breathless, she is well rested and seems more comfortable. She feels that way too.   Objective: Filed Vitals:   03/02/12 0434 03/02/12 0614 03/02/12 0805 03/02/12 0959  BP: 127/60 122/82  138/75  Pulse: 59 58  68  Temp: 97.9 F (36.6 C)     TempSrc: Oral     Resp: 20   18  Height:      Weight: 155 lb 10.3 oz (70.6 kg)     SpO2: 96%  96% 96%    General: Resting in bed. HEENT: PERRL, EOMI, no scleral icterus. Heart: RRR, no rubs, murmurs or gallops. Lungs: Scattered wheezes and rales.  Abdomen: Soft, nontender, nondistended, BS present. Extremities: Warm, no pedal edema. Neuro: Alert and oriented X3, grossly non-focal.  Relevant Labs  Lab 03/02/12 0500 03/01/12 0510 02/29/12 0312 02/28/12 0600 02/27/12 2029  NA 138 136 130* 133* 131*  K 4.2 4.2 -- -- --  CL 105 101 95* 101 98  CO2 24 24 21 21 19   GLUCOSE 205* 228* 179* 254* 187*  BUN 57* 56* 55* 37* 35*  CREATININE 1.06 1.21* 1.30* 1.10 1.07  CALCIUM 8.9 9.0 8.6 8.9 8.9  MG -- -- -- 2.0 --  PHOS -- -- -- 3.7 --    Intake/Output Summary (Last 24 hours) at 03/02/12 1324 Last data filed at 03/02/12 0834  Gross per 24 hour  Intake    600 ml  Output   1475 ml  Net   -875 ml     Assessment and Plan COPD exacerbation: Her wheezing is considerably improved.  We have  decreased solumedrol to 40 mg Q12, in the setting of her slow progress, and might think about reverting to oral prednisone tomorrow.    Acute on chronic systolic CHF: She is being diuresed with oral lasix 40 mg BID. Her creatinine bump seems to be trending down. We will continue with same treatment. We will follow cardiology recommendations regarding her left heart thrombus and reduced ejection fraction.   Other chronic issues per resident note.    Thanks Aletta Edouard, MD 03/02/2012, 1:22 PM

## 2012-03-03 ENCOUNTER — Inpatient Hospital Stay (HOSPITAL_COMMUNITY): Payer: Medicare (Managed Care)

## 2012-03-03 DIAGNOSIS — F0280 Dementia in other diseases classified elsewhere without behavioral disturbance: Secondary | ICD-10-CM | POA: Diagnosis present

## 2012-03-03 DIAGNOSIS — G309 Alzheimer's disease, unspecified: Secondary | ICD-10-CM | POA: Diagnosis present

## 2012-03-03 DIAGNOSIS — IMO0001 Reserved for inherently not codable concepts without codable children: Secondary | ICD-10-CM

## 2012-03-03 LAB — CBC WITH DIFFERENTIAL/PLATELET
Basophils Absolute: 0 10*3/uL (ref 0.0–0.1)
Eosinophils Absolute: 0 10*3/uL (ref 0.0–0.7)
Eosinophils Relative: 0 % (ref 0–5)
Lymphocytes Relative: 13 % (ref 12–46)
MCH: 29.7 pg (ref 26.0–34.0)
MCV: 87.8 fL (ref 78.0–100.0)
Neutrophils Relative %: 78 % — ABNORMAL HIGH (ref 43–77)
Platelets: 75 10*3/uL — ABNORMAL LOW (ref 150–400)
RBC: 3.44 MIL/uL — ABNORMAL LOW (ref 3.87–5.11)
RDW: 13.9 % (ref 11.5–15.5)
WBC: 4.9 10*3/uL (ref 4.0–10.5)

## 2012-03-03 LAB — PROTIME-INR
INR: 1.65 — ABNORMAL HIGH (ref 0.00–1.49)
Prothrombin Time: 19 seconds — ABNORMAL HIGH (ref 11.6–15.2)

## 2012-03-03 LAB — GLUCOSE, CAPILLARY: Glucose-Capillary: 192 mg/dL — ABNORMAL HIGH (ref 70–99)

## 2012-03-03 LAB — BASIC METABOLIC PANEL
CO2: 27 mEq/L (ref 19–32)
Calcium: 8.9 mg/dL (ref 8.4–10.5)
Creatinine, Ser: 1.16 mg/dL — ABNORMAL HIGH (ref 0.50–1.10)
GFR calc non Af Amer: 43 mL/min — ABNORMAL LOW (ref >90)
Sodium: 141 mEq/L (ref 135–145)

## 2012-03-03 MED ORDER — WARFARIN SODIUM 2.5 MG PO TABS
2.5000 mg | ORAL_TABLET | Freq: Once | ORAL | Status: DC
  Filled 2012-03-03: qty 1

## 2012-03-03 MED ORDER — WARFARIN SODIUM 5 MG PO TABS
5.0000 mg | ORAL_TABLET | Freq: Once | ORAL | Status: DC
  Filled 2012-03-03: qty 1

## 2012-03-03 MED ORDER — FERROUS SULFATE 325 (65 FE) MG PO TABS: 325.0000 mg | ORAL_TABLET | Freq: Two times a day (BID) | ORAL | Status: DC

## 2012-03-03 MED ORDER — SPIRONOLACTONE 25 MG PO TABS: 25.0000 mg | ORAL_TABLET | Freq: Every day | ORAL | Status: AC

## 2012-03-03 MED ORDER — ALBUTEROL SULFATE HFA 108 (90 BASE) MCG/ACT IN AERS: 2.0000 | INHALATION_SPRAY | RESPIRATORY_TRACT | Status: DC | PRN

## 2012-03-03 MED ORDER — TRAZODONE HCL 50 MG PO TABS: 50.0000 mg | ORAL_TABLET | Freq: Every evening | ORAL | Status: DC | PRN

## 2012-03-03 MED ORDER — CARVEDILOL 6.25 MG PO TABS: 6.2500 mg | ORAL_TABLET | Freq: Two times a day (BID) | ORAL | Status: AC

## 2012-03-03 MED ORDER — CHLORHEXIDINE GLUCONATE CLOTH 2 % EX PADS: 6.0000 | MEDICATED_PAD | Freq: Every day | CUTANEOUS | Status: DC

## 2012-03-03 MED ORDER — MUPIROCIN 2 % EX OINT: 1.0000 "application " | TOPICAL_OINTMENT | Freq: Two times a day (BID) | CUTANEOUS | Status: DC

## 2012-03-03 MED ORDER — WARFARIN SODIUM 2.5 MG PO TABS: 2.5000 mg | ORAL_TABLET | Freq: Every day | ORAL | Status: DC

## 2012-03-03 MED ORDER — FUROSEMIDE 40 MG PO TABS: 40.0000 mg | ORAL_TABLET | Freq: Two times a day (BID) | ORAL | Status: DC

## 2012-03-03 MED ORDER — ALBUTEROL SULFATE (5 MG/ML) 0.5% IN NEBU: 2.5000 mg | INHALATION_SOLUTION | RESPIRATORY_TRACT | Status: DC | PRN

## 2012-03-03 MED ORDER — PREDNISONE 10 MG PO TABS: 10.0000 mg | ORAL_TABLET | Freq: Every day | ORAL | Status: DC

## 2012-03-03 NOTE — Progress Notes (Signed)
Subjective:  Breathing "OK" she says she has been up without problem. She told me this am she was going to discuss BiV with her family after discharge, she wants to go home today.  Objective:  Vital Signs in the last 24 hours: Temp:  [97.4 F (36.3 C)-97.7 F (36.5 C)] 97.4 F (36.3 C) (12/20 0535) Pulse Rate:  [68-97] 97  (12/20 0535) Resp:  [18] 18  (12/20 0535) BP: (119-138)/(67-75) 134/74 mmHg (12/20 0535) SpO2:  [93 %-97 %] 93 % (12/20 0535) Weight:  [71.215 kg (157 lb)] 71.215 kg (157 lb) (12/20 0535)  Intake/Output from previous day:  Intake/Output Summary (Last 24 hours) at 03/03/12 0831 Last data filed at 03/03/12 0800  Gross per 24 hour  Intake   1677 ml  Output   3075 ml  Net  -1398 ml    Physical Exam: General appearance: alert, cooperative, no distress and alert and appropriate Lungs: decreased breath sounds, no rales Heart: regular rate and rhythm and bradycardia   Rate: 58  Rhythm: normal sinus rhythm and BBB, PVCs  Lab Results:  Basename 03/03/12 0625 03/02/12 0500  WBC 4.9 4.7  HGB 10.2* 9.5*  PLT 75* 77*    Basename 03/03/12 0625 03/02/12 0500  NA 141 138  K 3.7 4.2  CL 103 105  CO2 27 24  GLUCOSE 187* 205*  BUN 62* 57*  CREATININE 1.16* 1.06   No results found for this basename: TROPONINI:2,CK,MB:2 in the last 72 hours Hepatic Function Panel No results found for this basename: PROT,ALBUMIN,AST,ALT,ALKPHOS,BILITOT,BILIDIR,IBILI in the last 72 hours No results found for this basename: CHOL in the last 72 hours  Basename 03/03/12 0625  INR 1.65*    Imaging: Imaging results have been reviewed  Cardiac Studies:  Assessment/Plan:   Principal Problem:  *Systolic CHF, acute on chronic (worsening LV function) EF of 20-25% on echo (12/16) Active Problems:  DYSPNEA  Microcytic anemia, endoscopy negative for PUD 03/01/12  Thrombus, Ca++ LV thrombus on echo  COPD (chronic obstructive pulmonary disease)  Warfarin anticoagulation, resumed  after endoscopy this admission  Normal coronary arteries 2009 and 2012 BUT new LVD with ant WMA this admsision.  DM  HYPERLIPIDEMIA  HYPERTENSION  MRSA carrier (nares)  History of atrial fibrillation  Pancytopenia  BBB (bundle branch block)  Dementia in Alzheimer's disease, mild  Plan-Will discuss with Dr Herbie Baltimore. Her BNP is still elevated but she does not appear to be in CHF on exam-( ? Secondary to steroids). Long term plan per Dr Royann Shivers- no ICD or lifevest, no cath. The pt and family will consider BiV. She is on Lovenox to Coumadin, Hgb stable.    Corine Shelter PA-C 03/03/2012, 8:31 AM

## 2012-03-03 NOTE — Progress Notes (Signed)
D/c via [facility]. D/c instructions and medications reviewed with PT and [facility] staff. Pt and Engineer, maintenance state understanding. All Pt and [facility] staff questions answered

## 2012-03-03 NOTE — Progress Notes (Signed)
I have seen and evaluated the patient this morning along with Corine Shelter, PA. I agree with his findings, examination as well as impression recommendations.  She feels much better -- off O2.    Good diuresis yesterday -- however BNP still ~13000, I am not sure how much the steroids could be affecting this result;She has JVD ~12 cm H20 (almost to angle of jaw), suggesting that she may still be hypervolemic.    Will Check 2 V CXR to reassess for edema / infiltrate -- provided this has improved, she may well be ok for d/c from a cardiac standpoint  Optimally, would like to see BNP go down at least to admission, was ~3000 18 months ago, indicating that it may not be a very accurate monitoring tool.    Certainly keep on BID Lasix, but seems to be responding to PO dosing.    Most likely NICM - EF profoundly reduced.  ? Of LV apical thrombus (reviewed with colleagues who feels strongly that this is not a thrombus -- is most likely a  Calcified "False tendon/trabeculation"; he will amend the report to show this).  BP & HR are stable -- may be able to increase Coreg as she is not floridly hypervolemic; is on ACE-I & Aldactone now, but would not up-titrate until we see what direction her Cr is heading.  She does have h/o Afib, & certainly is at risk for CVA, warrants anticoagulation; however, has had GI bleed issues in the past -- she may eventually benefit more from Rx with a NOAC such as Eliquis (better safety profile vs. Warfarin).   She will discuss the issue re CRT BiV PPM with her family -- but can be done after d/c.  Declined ICD, Cardiac Cath & LifeVest.  Marykay Lex, M.D., M.S. [facility] [address] [place], [place]  [address]  [phone] Pager # [phone] 03/03/2012 9:04 AM

## 2012-03-03 NOTE — Progress Notes (Signed)
OT Cancellation Note   Treatment cancelled today due to patient receiving procedure or test (x ray). Will re-attempt when pt returns.    03/03/2012 Cipriano Mile OTR/L Pager [phone] Office [phone]

## 2012-03-03 NOTE — Progress Notes (Addendum)
Subjective: Breathing better, 2 non tarry appearing stools yesterday.  Requests medication for sleep at discharge.  No other complaints.    Objective: Vital signs in last 24 hours: Filed Vitals:   03/02/12 1355 03/02/12 1911 03/02/12 2023 03/03/12 0535  BP: 119/71  124/67 134/74  Pulse: 70  70 97  Temp: 97.4 F (36.3 C)  97.7 F (36.5 C) 97.4 F (36.3 C)  TempSrc: Oral  Oral Oral  Resp: 18  18 18   Height:      Weight:    157 lb (71.215 kg)  SpO2: 96% 97% 96% 93%   Weight change: 1 lb 5.7 oz (0.615 kg)  Intake/Output Summary (Last 24 hours) at 03/03/12 0741 Last data filed at 03/03/12 4098  Gross per 24 hour  Intake   1317 ml  Output   2675 ml  Net  -1358 ml   General:lying in bed, awake, nad, alert and oriented x 3 HEENT: [place]/at CV: RRR no rubs, murmurs, gallops Lungs:mild basilar wheezes b/l, much improved  Abdomen: +mild RUQ and LUQ ttp, soft, normal bs, other areas no ttp, nd Extremities: warm, no cyanosis or edema Neuro: CN 2-12 grossly intact, moving all 4 extremities  Lab Results: Basic Metabolic Panel:  Lab 03/03/12 1191 03/02/12 0500 02/28/12 0600  NA 141 138 --  K 3.7 4.2 --  CL 103 105 --  CO2 27 24 --  GLUCOSE 187* 205* --  BUN 62* 57* --  CREATININE 1.16* 1.06 --  CALCIUM 8.9 8.9 --  MG -- -- 2.0  PHOS -- -- 3.7   Liver Function Tests:  Lab 02/28/12 0600  AST 34  ALT 14  ALKPHOS 71  BILITOT 0.9  PROT 6.3  ALBUMIN 3.0*   CBC:  Lab 03/03/12 0625 03/02/12 0500  WBC 4.9 4.7  NEUTROABS 3.8 4.0  HGB 10.2* 9.5*  HCT 30.2* 28.9*  MCV 87.8 88.7  PLT 75* 77*   Cardiac Enzymes:  Lab 02/28/12 1543 02/28/12 0915 02/28/12 0600  CKTOTAL -- -- --  CKMB -- -- --  CKMBINDEX -- -- --  TROPONINI <0.30 <0.30 <0.30   BNP:  Lab 03/03/12 0625 03/02/12 0500 03/01/12 0510  PROBNP 13270.0* 13790.0* 22899.0*   CBG:  Lab 03/03/12 0551 03/02/12 2033 03/02/12 1707 03/02/12 1101 03/02/12 0606 03/01/12 2050  GLUCAP 184* 193* 216* 231* 194* 366*    Coagulation:  Lab 03/03/12 0625 03/02/12 0500 02/27/12 2029  LABPROT 19.0* 16.3* 15.2  INR 1.65* 1.34 1.22   Anemia Panel:  Lab 02/28/12 0029  VITAMINB12 672  FOLATE >20.0  FERRITIN 90  TIBC 291  IRON 20*  RETICCTPCT 1.7   Misc. Labs: none  Micro Results: Recent Results (from the past 240 hour(s))  MRSA PCR SCREENING     Status: Abnormal   Collection Time   02/27/12 11:38 PM      Component Value Range Status Comment   MRSA by PCR POSITIVE (*) NEGATIVE Final    Studies/Results: No results found. Medications: Scheduled Meds:    . sodium chloride  500 mL Intravenous Once  . albuterol  2.5 mg Nebulization TID  . antiseptic oral rinse  15 mL Mouth Rinse BID  . carvedilol  6.25 mg Oral BID WC  . Chlorhexidine Gluconate Cloth  6 each Topical Q0600  . citalopram  20 mg Oral Daily  . docusate sodium  100 mg Oral BID  . doxycycline  100 mg Oral Q12H  . enoxaparin (LOVENOX) injection  70 mg Subcutaneous Q12H  . feeding  supplement  237 mL Oral BID BM  . ferrous sulfate  325 mg Oral Q lunch  . furosemide  40 mg Oral BID  . guaiFENesin  600 mg Oral BID  . insulin aspart  0-5 Units Subcutaneous QHS  . insulin aspart  0-9 Units Subcutaneous TID WC  . insulin glargine  10 Units Subcutaneous QHS  . ipratropium  0.5 mg Nebulization TID  . isosorbide mononitrate  30 mg Oral Daily  . lisinopril  10 mg Oral Daily  . methylPREDNISolone (SOLU-MEDROL) injection  40 mg Intravenous Q12H  . mupirocin ointment  1 application Nasal BID  . pantoprazole  40 mg Oral Daily  . simvastatin  20 mg Oral q1800  . sodium chloride  250 mL Intravenous Once  . sodium chloride  3 mL Intravenous Q12H  . sodium chloride  3 mL Intravenous Q12H  . spironolactone  25 mg Oral Daily  . Warfarin - Pharmacist Dosing Inpatient   Does not apply q1800   Continuous Infusions:  PRN Meds:.sodium chloride, acetaminophen, acetaminophen, albuterol, albuterol, HYDROcodone-acetaminophen, ondansetron (ZOFRAN) IV,  ondansetron, sodium chloride, sodium chloride, traZODone Assessment/Plan:  1.Acute on chronic systolic and diastolic heart failure  -EF now 20-25 % with grade 1 diastolic dysfunction down from EF 40-45% -Coreg 6.25 mg bid per cards, continue ACEI and statin -daily wts (down 3 lbs), strict i/o (24 hours -1.358 L, net neg 5.67 L since admission) -proBNP repeat >13K -Patient wants to hold on cardiac procedures i.e left and right heart cath, life vest, ICD interventions -Cards f/u 03/17/11 10:30 with Dr. Herbie Baltimore   2. Microcytic Anemia -Hemoglobin 7.4 on admission s/p 3 units of blood with hbg 10.2 today;  hbg goal >8 -Continue Fe 325 mg daily  -Protonix  40 mg orally dialy    3. DYSPNEA likely secondary to COPD exacerbation -resolved  -etiology thought to be multifactorial secondary to acute on chronic systolic and diastolic failure and pulmonary (i.e COPD exacerbation versus bronchitis (but no prior pfts on file).  Patient is a former heavy smoker.  -Continue Albuterol neb tid, prn Albuterol q4 prn inhaler and Albuterol nebulizer q2 prn -Doxycycline 100 mg bid (Day 5) for possible COPD exacerbation-d/c Doxycycline today  -Consider pft evaluation outpatient  -Solumedrol 40 q12-Day 6 of steroids.  Will d/c with 30 mg x 3 days, 20 mg x 3 days, 10 mg x 3 days oral Prednisone  4. Fixed thrombus noted in left ventricle with history of atrial fibrillation -Cardiology does not think this is a thrombus now and echo may be changed in the future report reading -started Lovenox and Coumadin per pharm. INR 1.65.  If this is not a thrombus will not bridge with Lovenox only continue Coumadin for h/o atrial fib as CHADS is 4. Will d/c Coumadin 2.5 mg daily INR needs to be checked Monday.   -will ask that cards address this outpatient and also decide if they want to put patient on Eliquis.  Unsure of cost of this medication.  For now will d/c with Coumadin and Cards f/u  5. pancytopenia with chronic  thrombocytopenia -SPEP nonspecific findings that could be associated with acute inflammatory response.  Pending UPEP.  Consider outpatient bone marrow biopsy or further w/u in the future for pancytopenia with heme/onc -plts 75 today. Baseline 100s  6. DM  -hyperglycemic 2/2 steroids, improved but still elevated  -HA1C 6.3 -will monitor cbg -continue SSI, Lantus 10 units qhs  7. F/E/N -will monitor labs -diet ordered cardiac   8. DVT  Px -Lovenox, coumadin  Dispo:d/c today   The patient does have a current PCP (Thane Edu, MD), therefore will be requiring OPC follow-up after discharge.   The patient does have transportation limitations that hinder transportation to clinic appointments.  .Services Needed at time of discharge: Y = Yes, Blank = No PT:   OT: Y  RN:   Equipment:   Other:     LOS: 5 days   Annett Gula [phone] 03/03/2012, 7:41 AM

## 2012-03-03 NOTE — Progress Notes (Addendum)
ANTICOAGULATION CONSULT NOTE - Follow Up Consult  Pharmacy Consult for coumadin and lovenox Indication: atrial fibrillation and LV thrombus  Allergies  Allergen Reactions  . Paroxetine     unknown    Patient Measurements: Height: 5\' 1"  (154.9 cm) Weight: 157 lb (71.215 kg) IBW/kg (Calculated) : 47.8    Vital Signs: Temp: 97.4 F (36.3 C) (12/20 0535) Temp src: Oral (12/20 0535) BP: 134/74 mmHg (12/20 0535) Pulse Rate: 97  (12/20 0535)  Labs:  Basename 03/03/12 0625 03/02/12 0500 03/01/12 0510  HGB 10.2* 9.5* --  HCT 30.2* 28.9* 28.8*  PLT 75* 77* 71*  APTT -- -- --  LABPROT 19.0* 16.3* --  INR 1.65* 1.34 --  HEPARINUNFRC -- -- --  CREATININE 1.16* 1.06 1.21*  CKTOTAL -- -- --  CKMB -- -- --  TROPONINI -- -- --    Estimated Creatinine Clearance: 34.9 ml/min (by C-G formula based on Cr of 1.16).   Medications:  Scheduled:    . sodium chloride  500 mL Intravenous Once  . albuterol  2.5 mg Nebulization TID  . antiseptic oral rinse  15 mL Mouth Rinse BID  . carvedilol  6.25 mg Oral BID WC  . Chlorhexidine Gluconate Cloth  6 each Topical Q0600  . citalopram  20 mg Oral Daily  . docusate sodium  100 mg Oral BID  . doxycycline  100 mg Oral Q12H  . enoxaparin (LOVENOX) injection  70 mg Subcutaneous Q12H  . feeding supplement  237 mL Oral BID BM  . ferrous sulfate  325 mg Oral Q lunch  . [COMPLETED] furosemide  40 mg Intravenous Once  . furosemide  40 mg Oral BID  . guaiFENesin  600 mg Oral BID  . insulin aspart  0-5 Units Subcutaneous QHS  . insulin aspart  0-9 Units Subcutaneous TID WC  . insulin glargine  10 Units Subcutaneous QHS  . ipratropium  0.5 mg Nebulization TID  . isosorbide mononitrate  30 mg Oral Daily  . lisinopril  10 mg Oral Daily  . methylPREDNISolone (SOLU-MEDROL) injection  40 mg Intravenous Q12H  . mupirocin ointment  1 application Nasal BID  . pantoprazole  40 mg Oral Daily  . simvastatin  20 mg Oral q1800  . sodium chloride  250 mL  Intravenous Once  . sodium chloride  3 mL Intravenous Q12H  . sodium chloride  3 mL Intravenous Q12H  . spironolactone  25 mg Oral Daily  . [COMPLETED] warfarin  5 mg Oral ONCE-1800  . [COMPLETED] warfarin  5 mg Oral ONCE-1800  . [COMPLETED] warfarin   Does not apply Once  . Warfarin - Pharmacist Dosing Inpatient   Does not apply q1800  . [DISCONTINUED] feeding supplement  237 mL Oral BID BM  . [DISCONTINUED] feeding supplement  237 mL Oral TID BM  . [DISCONTINUED] feeding supplement  237 mL Oral BID BM  . [DISCONTINUED] methylPREDNISolone (SOLU-MEDROL) injection  40 mg Intravenous Q8H    Assessment: 78 y.o female on lovenox and coumadin per pharmacy for history of atrial fibrillation and recent ECHO findings of LV thrombus. Of note, patient recently had EGD to evaluate anemia and melena which revealed no active bleeding or ulceration. GI ok with starting anticoagulation. No overt bleeding noted. INR steadily increasing 1.65 today, CBC stable.  Per MD patient to be discharged home on coumadin today, will decrease dose for discharge.   Goal of Therapy:  INR 2-3 Monitor platelets by anticoagulation protocol: Yes   Plan:  -Continue Lovenox 70  mg SQ q12h  -Coumadin 2.5 mg po tonight x1  -Monitor for s/sx of bleeding  -INR and CBC daily  Bola A. Wandra Feinstein D Clinical Pharmacist [phone] Phone [phone] 03/03/2012 8:50 AM

## 2012-03-03 NOTE — Progress Notes (Signed)
Occupational Therapy Treatment Patient Details Name: [name] MRN: [id number] DOB: [birth year] Today's Date: 03/03/2012 Time: 1035-1050 OT Time Calculation (min): 15 min  OT Assessment / Plan / Recommendation Comments on Treatment Session Anticipate d/c home today.  Making good progress.    Follow Up Recommendations   (OT at [facility] program)    Barriers to Discharge       Equipment Recommendations  None recommended by OT    Recommendations for Other Services    Frequency Min 2X/week   Plan Frequency needs to be updated;Discharge plan remains appropriate    Precautions / Restrictions Restrictions Weight Bearing Restrictions: No   Pertinent Vitals/Pain See vitals    ADL  Grooming: Performed;Wash/dry hands;Supervision/safety Where Assessed - Grooming: Unsupported sitting Upper Body Dressing: Performed;Set up Where Assessed - Upper Body Dressing: Unsupported sitting Toilet Transfer: Performed;Supervision/safety Toilet Transfer Method: Sit to stand;Stand pivot Acupuncturist: Materials engineer and Hygiene: Performed;Supervision/safety Where Assessed - Engineer, mining and Hygiene: Sit to stand from 3-in-1 or toilet Equipment Used: Rolling walker Transfers/Ambulation Related to ADLs: supervision with RW ADL Comments: Pt with urinary incontinence in bed upon OT arrival (reports she typically wears depends at home).  Transferred to toilet and donned clean gown. O2 at 100% on RA.    OT Diagnosis:    OT Problem List:   OT Treatment Interventions:     OT Goals ADL Goals Pt Will Perform Grooming: Standing at sink;Unsupported;with supervision ADL Goal: Grooming - Progress: Met Pt Will Transfer to Toilet: Ambulation;with supervision;with DME;Other (comment) ADL Goal: Toilet Transfer - Progress: Progressing toward goals Pt Will Perform Toileting - Clothing Manipulation: with supervision;Standing ADL Goal: Toileting -  Clothing Manipulation - Progress: Met Pt Will Perform Toileting - Hygiene: with supervision;Standing at 3-in-1/toilet ADL Goal: Toileting - Hygiene - Progress: Met Additional ADL Goal #1: Complete functional moiblity @ RW level for ADL with S ADL Goal: Additional Goal #1 - Progress: Progressing toward goals  Visit Information  Last OT Received On: 03/03/12 Assistance Needed: +1    Subjective Data      Prior Functioning       Cognition  Overall Cognitive Status: History of cognitive impairments - at baseline Arousal/Alertness: Awake/alert Orientation Level: Appears intact for tasks assessed Behavior During Session: [facility] for tasks performed    Mobility  Shoulder Instructions Bed Mobility Bed Mobility: Supine to Sit;Sitting - Scoot to Edge of Bed;Sit to Supine Supine to Sit: 6: Modified independent (Device/Increase time);HOB elevated Sitting - Scoot to Edge of Bed: 6: Modified independent (Device/Increase time) Sit to Supine: 6: Modified independent (Device/Increase time);HOB elevated Transfers Transfers: Sit to Stand;Stand to Sit Sit to Stand: 6: Modified independent (Device/Increase time);From bed;From chair/3-in-1;With armrests;With upper extremity assist Stand to Sit: 6: Modified independent (Device/Increase time);To chair/3-in-1;To bed;With upper extremity assist;With armrests       Exercises      Balance     End of Session OT - End of Session Equipment Utilized During Treatment:  (RW) Activity Tolerance: Patient tolerated treatment well Patient left: with call bell/phone within reach;in bed  GO    03/03/2012 Cipriano Mile OTR/L Pager [phone] Office [phone]  Cipriano Mile 03/03/2012, 11:30 AM

## 2012-03-03 NOTE — Discharge Summary (Signed)
Internal Medicine Teaching Service Attending Note Date: 03/03/2012  Patient name: [name]  Medical record number: [id number]  Date of birth: Feb 03, 1930    I evaluated the patient on the day of discharge and discussed the discharge plan with my resident team.  In short, the patient was admitted for COPD and CHF exacerbation and stayed in the hospital for 5 days and got treatment for the same.   Today when I met the patient, she feels much improved. She is breathing without difficulty. On exam, she is wheezing only occasionally, rest of the exam unchanged from before.  Vitals reviewed.  General: Resting in bed. HEENT: PERRL, EOMI, no scleral icterus. Heart: RRR, no rubs, murmurs or gallops. Lungs: Occasional wheezing. Minor rales.  Abdomen: Soft, nontender, nondistended, BS present. Extremities: Warm, no pedal edema. Neuro: Alert and oriented X3, grossly intact.   I have reviewed the recent labs. [name] is being discharged on anticoagulation per cardiology recommendations who are aware that she has had episodes of GI bleeds, but still think that she would benefit from it. Dr. Royann Shivers from cardiology had a detailed discussion with the patient and her family about ICD placement, and they do not want it at this time. She will go home on lasix for further diuresis per cardiology recommendation. She will also be discharged on an extended taper prednisone.    I agree with the discharge and its documentation as well as disposition of the patient. Please see resident discharge summary for a detailed review of me and my team's discharge recommendations.  Thank you for working with me on this patient's care.   Thanks Aletta Edouard 03/03/2012, 11:49 AM

## 2012-03-03 NOTE — Discharge Summary (Addendum)
Internal Medicine Teaching [facility] Discharge Note  Name: [name] MRN: [id number] DOB: December 12, 1929 78 y.o.  Date of Admission: 02/27/2012  8:05 PM Date of Discharge: 03/03/2012 Attending Physician: Aletta Edouard, MD  Discharge Diagnosis: 1. *Systolic CHF, acute on chronic (worsening LV function) EF of 20-25% on echo (12/16), BBB (bundle branch block) 2. Microcytic anemia, endoscopy negative for PUD 03/01/12 3.Acute on chronic respiratory failure with DYSPNEA secondary to COPD exacerbation  4. Concern for possible LV thrombus on echo with history of atrial fibrillation-see #4 more details (no on Coumadin) 5. Resolved pancytopenia, chronic thrombocytopenia  6. DM 7. HYPERTENSION 8. 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      albuterol 108 (90 BASE) MCG/ACT inhaler   Commonly known as: PROVENTIL HFA;VENTOLIN HFA   Inhale 2 puffs into the lungs every 4 (four) hours as needed.      CALTRATE 600 PO   Take by mouth daily.      carvedilol 6.25 MG tablet   Commonly known as: COREG   Take 1 tablet (6.25 mg total) by mouth 2 (two) times daily with a meal.      Chlorhexidine Gluconate Cloth 2 % Pads   Apply 6 each topically daily at 6 (six) AM.      cholecalciferol 1000 UNITS tablet   Commonly known as: VITAMIN D   Take 1,000 Units by mouth daily.      citalopram 20 MG tablet   Commonly known as: CELEXA   Take 20 mg by mouth daily.      ferrous sulfate 325 (65 FE) MG tablet   Take 1 tablet (325 mg total) by mouth 2 (two) times daily with a meal.       furosemide 40 MG tablet   Commonly known as: LASIX   Take 1 tablet (40 mg total) by mouth 2 (two) times daily.      HYDROcodone-acetaminophen 5-500 MG per tablet   Commonly known as: VICODIN   Take 1 tablet by mouth every 6 (six) hours. For pain      IMODIUM PO   Take 1 tablet by mouth daily as needed.      isosorbide mononitrate 30 MG 24 hr tablet   Commonly known as: IMDUR   Take 30 mg by mouth daily.      lisinopril 10 MG tablet   Commonly known as: PRINIVIL,ZESTRIL   Take 10 mg by mouth daily.      lovastatin 40 MG tablet   Commonly known as: MEVACOR   Take 40 mg by mouth at bedtime.      mupirocin ointment 2 %   Commonly known as: BACTROBAN   Apply 1 application topically 2 (two) times daily.      nystatin cream   Commonly known as: MYCOSTATIN   Apply 1 application topically daily as needed. For rash      OCUVITE PO  Take by mouth.      omeprazole 20 MG capsule   Commonly known as: PRILOSEC   Take 20 mg by mouth daily.      potassium chloride 10 MEQ tablet   Commonly known as: K-DUR   Take 20 mEq by mouth 2 (two) times daily.      predniSONE 10 MG tablet   Commonly known as: DELTASONE   Take 1 tablet (10 mg total) by mouth daily. Take 3 pills for 3 days.  Take 2 pills for 3 days. Take 1 pill for 3 days      spironolactone 25 MG tablet   Commonly known as: ALDACTONE   Take 1 tablet (25 mg total) by mouth daily.      traZODone 50 MG tablet   Commonly known as: DESYREL   Take 1 tablet (50 mg total) by mouth at bedtime as needed for sleep.      triamcinolone cream 0.5 %   Commonly known as: KENALOG   Apply topically 3 (three) times daily.      warfarin 2.5 MG tablet   Commonly known as: COUMADIN   Take 1 tablet (2.5 mg total) by mouth daily.          Disposition and follow-up:   Ms.[name] was discharged from [facility] in stable condition.  At the hospital follow up visit please address  1) BMET, CBC, INR 2) Decided  what type of anticoagulation she should be placed on for atrial fibrillation (i.e Coumadin vs. Eliquis)  3) Decide if she will have cath, life vest, ICD, CRT   Follow-up Appointments:  Discharge Orders    Future Orders Please Complete By Expires   Diet - low sodium heart healthy      Increase activity slowly      Discharge instructions      Comments:   Please get your prescriptions filled at the pharmacy   Please follow up with [facility] Monday  Please follow up with [facility] 03/17/11 Dr. Herbie Baltimore at 10:30 am phone [phone]      Consultations: Treatment Team: 1) [facility] [place], [place], [place]) 2) [facility] Evette Cristal)  Procedures Performed:  Dg Chest 2 View  02/29/2012  *RADIOLOGY REPORT*  Clinical Data: Worsening wheezing, shortness of breath.  CHEST - 2 VIEW  Comparison: 02/27/2012.  Findings: The heart, mediastinum and hilar contours remain normal. There is no evidence of focal alveolar consolidation.  The interstitium appears overall slightly prominent and is slightly more prominent than on the previous study and as compared to earlier examinations.  No effusions.  Chronic changes involving the right shoulder and right clavicle again identified and stable.  No acute bony findings  IMPRESSION: Interstitial prominence is present and may be slightly worse than on the preceding study.  The possibility of a superimposed inflammatory process or even mild edema cannot be excluded.  No focal changes.   Original Report Authenticated By: Sander Radon, M.D.    Dg Chest Port 1 View  02/27/2012  *RADIOLOGY REPORT*  Clinical Data: Respiratory distress.  PORTABLE CHEST - 1 VIEW  Comparison: 10/11/2011  Findings: Heart size and vascularity are normal.  There are no infiltrates or effusions.  No acute osseous abnormality.  IMPRESSION: No acute disease.   Original Report Authenticated By: Francene Boyers, M.D.     2D Echo:   Study Conclusions  - Left ventricle: The cavity  size was mildly dilated. Wall thickness was normal. Systolic function was severely reduced. The  estimated ejection fraction was in the range of 20% to 25%. Doppler parameters are consistent with abnormal left ventricular relaxation (grade 1 diastolic dysfunction). Doppler parameters are consistent with both elevated ventricular end-diastolic filling pressure and elevated left atrial filling pressure. There is a calcified apical false tendon which is seen best on image 68, extending from the distal anterior to inferoapical walls. - Regional wall motion abnormality: Dyskinesis of the mid inferoseptal and apical septal myocardium; akinesis of the apical myocardium; severe hypokinesis of the mid anteroseptal myocardium; moderate hypokinesis of the apical anterior and basal inferoseptal myocardium. - Ventricular septum: Septal motion showed "bounce". - Mitral valve: Moderately calcified annulus. Mild diffuse, without chordal involvement. Mobility was mildly restricteddue to elevated LVEDP. Mild to moderate regurgitation. - Left atrium: The atrium was moderately to severely dilated. Impressions:  - A bradycardic atrial fibrillation rhythm was noted on this study, making this study technically difficult for the assessment of cardiac function. Consistent with severe coronary artery disease, predominantly involving the LAD. Consistent with ischemic cardiomyopathy due to prior infarction with congestive heart failure, with elevated left-sided pressure. The dysfunction is both systolic and diastolic. Transthoracic echocardiography. M-mode, complete 2D, spectral Doppler, and color Doppler. Height: Height: 154.9cm. Height: 61in. Weight: Weight: 72.7kg. Weight: 160lb. Body mass index: BMI: 30.3kg/m^2. Body surface area: BSA: 1.64m^2. Blood pressure: 124/48. Patient status: Inpatient. Location:  Bedside.  ------------------------------------------------------------  ------------------------------------------------------------ Left ventricle: The cavity size was mildly dilated. Wall thickness was normal. Systolic function was severely reduced. The estimated ejection fraction was in the range of 20% to 25%. Moderate Global Hypokinesis. There is a calcified apical false tendon which is seen best on image 68, extending from the distal anterior to inferoapical walls. Regional wall motion abnormalities: Dyskinesis of the mid inferoseptal and apical septal myocardium; akinesis of the apical myocardium; severe hypokinesis of the mid anteroseptal myocardium; moderate hypokinesis of the apical anterior and basal inferoseptal myocardium. Doppler parameters are consistent with abnormal left ventricular relaxation (grade 1 diastolic dysfunction). Doppler parameters are consistent with both elevated ventricular end-diastolic filling pressure and elevated left atrial filling pressure.  ------------------------------------------------------------ Aortic valve: Trileaflet; normal thickness leaflets. Mobility was not restricted. Doppler: Transvalvular velocity was within the normal range. There was no stenosis. No regurgitation.  ------------------------------------------------------------ Aorta: Aortic root: The aortic root was normal in size.  ------------------------------------------------------------ Mitral valve: Moderately calcified annulus. Mild diffuse, without chordal involvement. Mobility was mildly restricteddue to elevated LVEDP. Doppler: Transvalvular velocity was within the normal range. There was no evidence for stenosis. Mild to moderate regurgitation. Peak gradient: 4mm Hg (D).  ------------------------------------------------------------ Left atrium: The atrium was moderately to severely dilated.  ------------------------------------------------------------ Right  ventricle: The cavity size was normal. Wall thickness was normal. Systolic function was normal.  ------------------------------------------------------------ Ventricular septum: Septal motion showed "bounce".  ------------------------------------------------------------ Pulmonic valve: Poorly visualized. The valve appears to be grossly normal. Doppler: Transvalvular velocity was within the normal range. There was no evidence for stenosis. Mild regurgitation.  ------------------------------------------------------------ Tricuspid valve: Poorly visualized. The valve appears to be grossly normal. Doppler: Transvalvular velocity was within the normal range. Mild regurgitation.  ------------------------------------------------------------ Pulmonary artery: The main pulmonary artery was normal-sized. Systolic pressure could not be accurately estimated.  ------------------------------------------------------------ Right atrium: The atrium was at the upper limits of normal in size.  ------------------------------------------------------------ Pericardium: There was no pericardial effusion.  ------------------------------------------------------------ Systemic veins: Inferior vena cava: The vessel was normal in size.  ------------------------------------------------------------  2D measurements Normal Doppler measurements Normal Left ventricle Left ventricle LVID ED, 54.1 mm 43-52 Ea, lat ann,  12. cm/s ------ chord, tiss DP 9 PLAX E/Ea, lat 7.4 ------ LVID ES, 46.3 mm 23-38 ann, tiss DP 7 chord, Ea, med ann, 18. cm/s ------ PLAX tiss DP 7 FS, 14 % >29 E/Ea, med 5.1 ------ chord, ann, tiss DP 5 PLAX Mitral valve LVPW, ED 11.15 mm ------ Peak E vel 96. cm/s ------ IVS/LVPW 0.86 <1.3 3 ratio, ED Peak A vel 67. cm/s ------ Ventricular septum 6 IVS, ED 9.55 mm ------ Deceleration 239 ms 150-23 Aorta time 0 Root 34 mm ------ Peak 4 mm ------ diam, ED gradient, D Hg Left atrium  Peak E/A 1.4 ------ AP dim 43 mm ------ ratio AP dim 2.39 cm/m^2 <2.2 Systemic veins index Estimated CVP 10 mm ------ Hg    Admission HPI:  Chief Complaint:  Shortness of breath  History of Present Illness  The patient, [name], is a 78 y.o. year old female, with past medical history of Diabetes, CKD, hypertension and , who comes in with the chief complaint of shortness of breath. I have read the history documented by Dr.Doutova and I concur with the chronology of events.  When I met with the patient today, she was very short of breath and could not speak properly because of that. She is extremely hard of hearing. She has been a smoker. I do not see any documented COPD history, however she has some 50+ years of smoking thus I would presume that diagnosis in this patient. The patient is audibly wheezing when I see her. She says this has been going on since she had URI with cough since the last couple of weeks. She denies fever or chills. In the ED she was given BIPAP and lasix and she felt comfortable. She admits to having some dark tarry stools over the past couple of weeks. She denies chest pain or palpitations, however she is dyspneic on exertion. Her son is the POA because she has some history of dementia. However when I talked to the patient she sounded with it, alert and oriented, and making sense.   Review of Systems  Positive for - cough, shortness of breath, exertional dyspnea, black tarry stools  Negative for - fever, chills, chest pain, urinary complaints, palpitations.  Physical exam General: Resting in bed. Short of breath, effort in talking and moving around in bed.  HEENT: PERRL, EOMI, no scleral icterus.  Heart: RRR, no rubs, murmurs or gallops.  Lungs: Externally audible wheezes bilaterally  Abdomen: Soft, nontender, nondistended, BS present.  Extremities: Warm, no pedal edema.  Neuro: Alert and oriented X3, cranial nerves II-XII grossly intact,  strength and  sensation to light touch equal in bilateral upper and lower extremities    Hospital Course by problem list: 1. *Systolic CHF, acute on chronic (worsening LV function) EF of 20-25% on echo (12/16), BBB (bundle branch block) 2. Microcytic anemia, endoscopy negative for PUD 03/01/12 3. Acute on chronic respiratory failure with DYSPNEA secondary to COPD exacerbation  4. Concern for possible LV thrombus on echo with history of atrial fibrillation-see #4 more details (no on Coumadin) 5. Resolved pancytopenia, chronic thrombocytopenia  6. DM 7. HYPERTENSION 8. 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 dyskinesis of the mid inferoseptal and apical septal myocardium; akinesis of the apical myocardium; severe hypokinesis of the mid anteroseptal myocardium; moderate hypokinesis of the apical anterior and basal inferoseptal myocardium. They also noted a  bradycardic rhythm atrial fibrillation, medium fixed calcified thrombus in LV, severe CAD (esp. LAD), ischemic cardiomyopathy .  [facility] was consulted and thought possibly her worsening heart failure could be secondary to pulmonary status.  Her Lopressor was changed to Coreg 6.25 mg bid.  She was started on Spironolactone as well.  She was continued on her ACEI and statin.  We monitored daily weights and intake and output.  She was down 3 pounds by discharge and net negative 5.67 L since admission.  ProBNP was elevated this admission >7100--> >22K ---> 13K.  We diuresed her with intravenous Lasix initially 60 mg total and then she was put back on her home dose of Lasix 40 mg bid.  Doses were intermittently held due to trending up creatinine.  We will have Cardiology or her PCP evaluated BMET outpatient.  With her reduced EF.  Dr. Allyson Sabal thought it was less likely due to ischemic cardiomyopathy.  An echo done in 08/2010 showed  an EF of 45-50%. Her last cath was in 6/12 showed normal coronaries with an estimated EF of 35-40%, which makes ischemic cardiomyopathy less likely.  This patient may be candidate for life vest, ICD, CRT (Cardiac resynchronization therapy) due to bundle branch block and worsening heart failure.  It was brought up to possible cath her this admission but she and the family preferred to think on cath and also further cardiology procedures suggested i.e left and right heart cath, life vest, ICD interventions, CRT.  [facility] follow up is 03/17/11 10:30 with Dr. Herbie Baltimore   2. Microcytic Anemia  She had tarry bowel movements prior to admission which resolved by discharge.  On admission hemoglobin 7.4 on admission status post 3 units of blood with hemoglobin 10.2 on day of discharge.  Goal hemoglobin.  She was fecal occult blood test negative this admission.  Iron deficiency anemia noted on anemia panel.  She was started on iron 325 mg daily which was titrated up at discharge to 325 mg bid.  She was given Protonix this admission intravenous due to possible GI bleed.  [facility] was consulted.  She had and upper endoscopy with esophagogastric junction there was some erythema noted. There was no evidence of active bleeding or obvious ulceration. There was one focal area in the cardia of the stomach with some reddish mucosa which may be a little erosion.  There were no specific findings to prevent anticoagulation.  They recommended PPI outpatient and Fe supplementation.  Of note she has had a history of GI bleeding in the past with previous upper endoscopy, colonoscopy, and barium enema 2009 found to have diverticulosis. She has had GI hemorrhages in the past as well as bright red blood per rectum due to hemorrhoids. She also has had significantly elevated BUN with history GI bleeding. She was discontinued from anticoagulation Coumadin in the past due to risk of bleeding outweighing the benefits.    3. Acute on  chronic respiratory failure with DYSPNEA likely secondary to COPD exacerbation  Dyspnea of admission we thought was likely secondary to multifactorial reasons i.e secondary to acute on chronic worsening systolic and diastolic failure and COPD exacerbation.  Per the patient and family she has had pulmonary function tests in the past that do not indicate COPD.  She is former heavy smoker for 50 years and quit 2 years ago.  We treated her with Albuterol/Atrovent nebulizers this admission and as needed Albuterol nebulizer and inhaler.  She was also given Doxycycline 100 mg bid for  4.5 days.  She completed 5 days of intravenous steroids and 1 day on oral steroids (total 6 days).  We will discharge her with 30 mg x 3 days, 20 mg x 3 days, 10 mg x 3 days oral Prednisone.  She may need to have another pulmonary function test evaluation outpatient.   4. Fixed thrombus noted in left ventricle with history of atrial fibrillation  This was noted on echo this admission.  After review with [facility] days later they do not think there is a thrombus most likely a calcified "false tendon/trabeculation".  The echo report will be amended.  Initially due to concern for thrombus we started her on Lovenox and Coumadin per pharm. INR 1.65 (day of discharge). If this is not a thrombus we will not bridge with Lovenox and only continue Coumadin for history atrial fibrillation as CHADS is 4. She is a increased risk of stroke.  She will be discharged on Coumadin 2.5 mg daily and her INR needs to be checked Monday after discharge.  She will have follow up with [facility] and we will ask that at that time they address this issue outpatient and also decide if they want to put patient on Eliquis. (may help reduce future risk of GI bleeding). We did not start this medication this admission due to unsure of cost of this medication.    5. pancytopenia (resolved) with chronic thrombocytopenia  Initially she had low  cells lines WBC, RBCS, and platelets.  We were concerned for multiple myeloma or MDS.  SPEP showed nonspecific findings that could be associated with acute inflammatory response. She has a pending UPEP.  PCP may consider outpatient referral to hematology/oncology for bone marrow biopsy or further work up in the future for pancytopenia (if recurs) or chronic thrombocytopenia.  Her leukopenia resolved.  Baseline platelets are 100s.  On day of discharge platelets were 75.     6. Diabetes 2  She was hyperglycemic this admission secondary to steroids.  We started Lantus 10 mg qhs in addition to supplementation insulin which improved her blood sugars but they may be elevated while on steroids.  Her HA1C was 6.3 this admission.   7. HYPERTENSION  Her blood pressure was overall controlled.  We continued her Imdur, Lisinopril, and Lopressor was changed to Coreg 6.25 mg bid per cardiology recommendation.    8.MRSA nares  She was on contact and protocol.    Initially she was not anticoagulated due to concern for acute GI bleeding then she was on Lovenox and Coumadin per pharmacy this admission.       Discharge Vitals:  BP 142/82  Pulse 63  Temp 97.5 F (36.4 C) (Oral)  Resp 18  Ht 5\' 1"  (1.549 m)  Wt 157 lb (71.215 kg)  BMI 29.67 kg/m2  SpO2 95%  Discharge physical exam: General:lying in bed, awake, nad, alert and oriented x 3  HEENT: [place]/at  CV: RRR no rubs, murmurs, gallops  Lungs:mild basilar wheezes b/l, much improved  Abdomen: +mild RUQ and LUQ ttp, soft, normal bs, other areas no ttp, nd  Extremities: warm, no cyanosis or edema  Neuro: CN 2-12 grossly intact, moving all 4 extremities   Discharge Labs:  Results for NIKA, YAZZIE (MRN [id number]) as of 03/03/2012 09:09  Ref. Range 02/28/2012 06:00 02/29/2012 03:12 03/01/2012 05:10 03/02/2012 05:00 03/03/2012 06:25  Sodium Latest Range: 135-145 mEq/L 133 (L) 130 (L) 136 138 141  Potassium Latest Range: 3.5-5.1 mEq/L 4.9 4.1 4.2 4.2  3.7  Chloride Latest Range: 96-112 mEq/L 101 95 (L) 101 105 103  CO2 Latest Range: 19-32 mEq/L 21 21 24 24 27   BUN Latest Range: 6-23 mg/dL 37 (H) 55 (H) 56 (H) 57 (H) 62 (H)  Creatinine Latest Range: 0.50-1.10 mg/dL 8.46 9.62 (H) 9.52 (H) 1.06 1.16 (H)  Calcium Latest Range: 8.4-10.5 mg/dL 8.9 8.6 9.0 8.9 8.9  GFR calc non Af Amer Latest Range: >90 mL/min 46 (L) 38 (L) 41 (L) 48 (L) 43 (L)  GFR calc Af Amer Latest Range: >90 mL/min 53 (L) 44 (L) 48 (L) 56 (L) 50 (L)  Glucose Latest Range: 70-99 mg/dL 841 (H) 324 (H) 401 (H) 205 (H) 187 (H)  Phosphorus Latest Range: 2.3-4.6 mg/dL 3.7      Magnesium Latest Range: 1.5-2.5 mg/dL 2.0      Alkaline Phosphatase Latest Range: 39-117 U/L 71      Albumin Latest Range: 3.5-5.2 g/dL 3.0 (L)      AST Latest Range: 0-37 U/L 34      ALT Latest Range: 0-35 U/L 14      Total Protein Latest Range: 6.0-8.3 g/dL 6.3      Total Bilirubin Latest Range: 0.3-1.2 mg/dL 0.9       Results for SHAYLENE, PAGANELLI (MRN [id number]) as of 03/03/2012 09:09  Ref. Range 02/27/2012 20:29 02/28/2012 00:29 02/28/2012 06:00 02/29/2012 03:12  Sodium Latest Range: 135-145 mEq/L 131 (L)  133 (L) 130 (L)  Potassium Latest Range: 3.5-5.1 mEq/L 4.5  4.9 4.1  Chloride Latest Range: 96-112 mEq/L 98  101 95 (L)  CO2 Latest Range: 19-32 mEq/L 19  21 21   Mean Plasma Glucose Latest Range: <117 mg/dL  403 (H)    BUN Latest Range: 6-23 mg/dL 35 (H)  37 (H) 55 (H)  Creatinine Latest Range: 0.50-1.10 mg/dL 4.74  2.59 5.63 (H)  Calcium Latest Range: 8.4-10.5 mg/dL 8.9  8.9 8.6  GFR calc non Af Amer Latest Range: >90 mL/min 48 (L)  46 (L) 38 (L)  GFR calc Af Amer Latest Range: >90 mL/min 55 (L)  53 (L) 44 (L)  Glucose Latest Range: 70-99 mg/dL 875 (H)  643 (H) 329 (H)  Phosphorus Latest Range: 2.3-4.6 mg/dL   3.7   Magnesium Latest Range: 1.5-2.5 mg/dL   2.0   Alkaline Phosphatase Latest Range: 39-117 U/L   71   Albumin Latest Range: 3.5-5.2 g/dL   3.0 (L)   AST Latest Range: 0-37 U/L    34   ALT Latest Range: 0-35 U/L   14   Total Protein Latest Range: 6.0-8.3 g/dL   6.3   Total Bilirubin Latest Range: 0.3-1.2 mg/dL   0.9    Results for JERONDA, DON (MRN [id number]) as of 03/03/2012 09:09  Ref. Range 02/27/2012 20:30 02/27/2012 20:40 02/28/2012 06:00 02/28/2012 09:15 02/28/2012 15:43 03/01/2012 05:10 03/02/2012 05:00 03/03/2012 06:25  Troponin I Latest Range: <0.30 ng/mL <0.30  <0.30 <0.30 <0.30     Troponin i, poc Latest Range: 0.00-0.08 ng/mL  0.06        Pro B Natriuretic peptide (BNP) Latest Range: 0-450 pg/mL 7174.0 (H)     22899.0 (H) 13790.0 (H) 13270.0 (H)   Results for CATHERINA, PATES (MRN [id number]) as of 03/03/2012 09:09  Ref. Range 02/28/2012 00:29  Iron Latest Range: 42-135 ug/dL 20 (L)  UIBC Latest Range: 125-400 ug/dL 323  TIBC Latest Range: 250-470 ug/dL 557  Saturation Ratios Latest Range: 20-55 % 7 (L)  Ferritin Latest Range: 10-291 ng/mL 90  Folate No  range found >20.0  Results for ALEKSIA, FREIMAN (MRN [id number]) as of 03/03/2012 09:09  Ref. Range 02/28/2012 00:29  Vitamin B-12 Latest Range: 211-911 pg/mL 672   Results for GIZELLE, WHETSEL (MRN [id number]) as of 03/03/2012 09:09  Ref. Range 02/28/2012 15:38  Total Protein ELP Latest Range: 6.0-8.3 g/dL 5.4 (L)  Albumin ELP Latest Range: 55.8-66.1 % 54.9 (L)  Alpha-1-Globulin Latest Range: 2.9-4.9 % 6.1 (H)  Alpha-2-Globulin Latest Range: 7.1-11.8 % 12.5 (H)  Beta Globulin Latest Range: 4.7-7.2 % 6.5  Beta 2 Latest Range: 3.2-6.5 % 6.0  Gamma Globulin Latest Range: 11.1-18.8 % 14.0  M-SPIKE, % No range found NOT DETECTED  SPE Interp. No range found (NOTE)  Comment No range found (NOTE)   Results for LYSBETH, DICOLA (MRN [id number]) as of 03/03/2012 09:09  Ref. Range 02/27/2012 20:29 02/28/2012 00:29 02/28/2012 10:50 02/28/2012 18:44 02/29/2012 03:12 02/29/2012 11:43 02/29/2012 22:17 03/01/2012 05:10 03/02/2012 05:00 03/03/2012 06:25  WBC Latest Range: 4.0-10.5 K/uL 3.4 (L)  1.4 (LL)  5.0 6.6 8.8 5.4 4.1 4.7 4.9  RBC Latest Range: 3.87-5.11 MIL/uL 2.53 (L)  2.85 (L) 2.66 (L) 2.68 (L) 2.89 (L) 3.47 (L) 3.31 (L) 3.26 (L) 3.44 (L)  Hemoglobin Latest Range: 12.0-15.0 g/dL 7.4 (L)  8.3 (L) 7.8 (L) 7.8 (L) 8.5 (L) 10.2 (L) 9.7 (L) 9.5 (L) 10.2 (L)  HCT Latest Range: 36.0-46.0 % 22.9 (L)  24.9 (L) 23.3 (L) 23.7 (L) 25.5 (L) 30.3 (L) 28.8 (L) 28.9 (L) 30.2 (L)  MCV Latest Range: 78.0-100.0 fL 90.5  87.4 87.6 88.4 88.2 87.3 87.0 88.7 87.8  MCH Latest Range: 26.0-34.0 pg 29.2  29.1 29.3 29.1 29.4 29.4 29.3 29.1 29.7  MCHC Latest Range: 30.0-36.0 g/dL 08.6  57.8 46.9 62.9 52.8 33.7 33.7 32.9 33.8  RDW Latest Range: 11.5-15.5 % 13.7  13.6 13.7 13.8 13.8 14.0 14.0 14.1 13.9  Platelets Latest Range: 150-400 K/uL 61 (L)  61 (L) 62 (L) 82 (L) 95 (L) 87 (L) 71 (L) 77 (L) 75 (L)  Neutrophils Relative Latest Range: 43-77 %   73  85 (H)   86 (H) 85 (H) 78 (H)  Lymphocytes Relative Latest Range: 12-46 %   23  8 (L)   9 (L) 7 (L) 13  Monocytes Relative Latest Range: 3-12 %   4  7   4 7 10   Eosinophils Relative Latest Range: 0-5 %   0  0   0 0 0  Basophils Relative Latest Range: 0-1 %   1  0   0 0 0  NEUT# Latest Range: 1.7-7.7 K/uL   1.0 (L)  5.6   3.5 4.0 3.8  Lymphocytes Absolute Latest Range: 0.7-4.0 K/uL   0.3 (L)  0.5 (L)   0.4 (L) 0.3 (L) 0.6 (L)  Monocytes Absolute Latest Range: 0.1-1.0 K/uL   0.1  0.5   0.2 0.3 0.5  Eosinophils Absolute Latest Range: 0.0-0.7 K/uL   0.0  0.0   0.0 0.0 0.0  Basophils Absolute Latest Range: 0.0-0.1 K/uL   0.0  0.0   0.0 0.0 0.0  RBC Morphology No range found     POLYCHROMASIA PRESENT       RBC. Latest Range: 3.87-5.11 MIL/uL  2.35 (L)          Retic Ct Pct Latest Range: 0.4-3.1 %  1.7          Retic Count, Manual Latest Range: 19.0-186.0 K/uL  40.0          Tech Review  No range found   ELLIPTOCYTES          Results for EVIAN, DERRINGER (MRN [id number]) as of 03/03/2012 09:09  Ref. Range 02/27/2012 20:29 03/02/2012 05:00 03/03/2012 06:25  Prothrombin  Time Latest Range: 11.6-15.2 seconds 15.2 16.3 (H) 19.0 (H)  INR Latest Range: 0.00-1.49  1.22 1.34 1.65 (H)  Results for JAMIEKA, ROYLE (MRN [id number]) as of 03/03/2012 09:09  Ref. Range 02/28/2012 00:29 02/28/2012 06:00 02/29/2012 03:12 03/01/2012 05:10 03/02/2012 05:00 03/03/2012 06:25  Hemoglobin A1C Latest Range: <5.7 % 6.3 (H)       Glucose Latest Range: 70-99 mg/dL  782 (H) 956 (H) 213 (H) 205 (H) 187 (H)  TSH Latest Range: 0.350-4.500 uIU/mL  0.516       Results for FRANCISCA, LANGENDERFER (MRN [id number]) as of 03/03/2012 09:09  Ref. Range 02/28/2012 15:38  Alpha-1-Globulin Latest Range: 2.9-4.9 % 6.1 (H)  Alpha-2-Globulin Latest Range: 7.1-11.8 % 12.5 (H)  Results for MITZI, LILJA (MRN [id number]) as of 03/03/2012 09:09  Ref. Range 02/28/2012 06:19 02/29/2012 02:51  Osmolality, Ur Latest Range: [phone] mOsm/kg 295 (L)   Sodium, Ur No range found 36   Creatinine, Urine No range found 28.05   Immunofixation, Urine No range found  (NOTE)  Time-UPE24 No range found  RANDOM  Volume, Urine-UPE24 No range found  RANDOM  Total Protein, Urine-UPE24 No range found  3.2  Total Protein, Urine-Ur/day Latest Range: 10-140 mg/day  NOT CALC  ALBUMIN, U Latest Range: DETECTED   DETECTED  Alpha 1, Urine Latest Range: NONE DETECTED   DETECTED (A)  Alpha 2, Urine Latest Range: NONE DETECTED   DETECTED (A)  Beta, Urine Latest Range: NONE DETECTED   DETECTED (A)  Gamma Globulin, Urine Latest Range: NONE DETECTED   DETECTED (A)  Free Kappa Lt Chains,Ur Latest Range: 0.14-2.42 mg/dL  2.44  Free Lt Chn Excr Rate No range found  NOT CALC  Free Lambda Lt Chains,Ur Latest Range: 0.02-0.67 mg/dL  0.10  Free Lambda Excretion/Day No range found  NOT CALC  Free Kappa/Lambda Ratio Latest Range: 2.04-10.37 ratio  9.22  Results for EVADEAN, SPROULE (MRN [id number]) as of 03/03/2012 09:09  Ref. Range 02/27/2012 23:38  MRSA by PCR Latest Range: NEGATIVE  POSITIVE (A)   Results for LAMONT, GLASSCOCK (MRN  [id number]) as of 03/03/2012 09:09  Ref. Range 02/28/2012 14:07  Fecal Occult Blood, POC Latest Range: NEGATIVE  NEGATIVE    Results for orders placed during the hospital encounter of 02/27/12 (from the past 24 hour(s))  GLUCOSE, CAPILLARY     Status: Abnormal   Collection Time   03/02/12 11:01 AM      Component Value Range   Glucose-Capillary 231 (*) 70 - 99 mg/dL   Comment 1 Notify RN    GLUCOSE, CAPILLARY     Status: Abnormal   Collection Time   03/02/12  5:07 PM      Component Value Range   Glucose-Capillary 216 (*) 70 - 99 mg/dL   Comment 1 Notify RN    GLUCOSE, CAPILLARY     Status: Abnormal   Collection Time   03/02/12  8:33 PM      Component Value Range   Glucose-Capillary 193 (*) 70 - 99 mg/dL  GLUCOSE, CAPILLARY     Status: Abnormal   Collection Time   03/03/12  5:51 AM      Component Value Range   Glucose-Capillary 184 (*) 70 - 99 mg/dL  BASIC METABOLIC PANEL  Status: Abnormal   Collection Time   03/03/12  6:25 AM      Component Value Range   Sodium 141  135 - 145 mEq/L   Potassium 3.7  3.5 - 5.1 mEq/L   Chloride 103  96 - 112 mEq/L   CO2 27  19 - 32 mEq/L   Glucose, Bld 187 (*) 70 - 99 mg/dL   BUN 62 (*) 6 - 23 mg/dL   Creatinine, Ser 7.82 (*) 0.50 - 1.10 mg/dL   Calcium 8.9  8.4 - 95.6 mg/dL   GFR calc non Af Amer 43 (*) >90 mL/min   GFR calc Af Amer 50 (*) >90 mL/min  CBC WITH DIFFERENTIAL     Status: Abnormal   Collection Time   03/03/12  6:25 AM      Component Value Range   WBC 4.9  4.0 - 10.5 K/uL   RBC 3.44 (*) 3.87 - 5.11 MIL/uL   Hemoglobin 10.2 (*) 12.0 - 15.0 g/dL   HCT 21.3 (*) 08.6 - 57.8 %   MCV 87.8  78.0 - 100.0 fL   MCH 29.7  26.0 - 34.0 pg   MCHC 33.8  30.0 - 36.0 g/dL   RDW 46.9  62.9 - 52.8 %   Platelets 75 (*) 150 - 400 K/uL   Neutrophils Relative 78 (*) 43 - 77 %   Neutro Abs 3.8  1.7 - 7.7 K/uL   Lymphocytes Relative 13  12 - 46 %   Lymphs Abs 0.6 (*) 0.7 - 4.0 K/uL   Monocytes Relative 10  3 - 12 %   Monocytes Absolute  0.5  0.1 - 1.0 K/uL   Eosinophils Relative 0  0 - 5 %   Eosinophils Absolute 0.0  0.0 - 0.7 K/uL   Basophils Relative 0  0 - 1 %   Basophils Absolute 0.0  0.0 - 0.1 K/uL  PROTIME-INR     Status: Abnormal   Collection Time   03/03/12  6:25 AM      Component Value Range   Prothrombin Time 19.0 (*) 11.6 - 15.2 seconds   INR 1.65 (*) 0.00 - 1.49  PRO B NATRIURETIC PEPTIDE     Status: Abnormal   Collection Time   03/03/12  6:25 AM      Component Value Range   Pro B Natriuretic peptide (BNP) 13270.0 (*) 0 - 450 pg/mL    Signed: Annett Gula 03/03/2012, 10:47 AM   Time Spent on Discharge: 45 minutes  Services Ordered on Discharge: OT Equipment Ordered on Discharge: none

## 2012-03-17 ENCOUNTER — Other Ambulatory Visit (HOSPITAL_COMMUNITY): Payer: Self-pay | Admitting: *Deleted

## 2012-03-20 ENCOUNTER — Ambulatory Visit (HOSPITAL_COMMUNITY)
Admission: RE | Admit: 2012-03-20 | Discharge: 2012-03-20 | Disposition: A | Discharge status: Home / Self Care | Payer: Medicare (Managed Care) | Source: Ambulatory Visit | Attending: Family Medicine | Admitting: Family Medicine

## 2012-03-20 DIAGNOSIS — D509 Iron deficiency anemia, unspecified: Secondary | ICD-10-CM | POA: Insufficient documentation

## 2012-03-20 MED ORDER — FERUMOXYTOL INJECTION 510 MG/17 ML
INTRAVENOUS | Status: AC
  Administered 2012-03-20: 510 mg via INTRAVENOUS
  Filled 2012-03-20: qty 17

## 2012-03-20 MED ORDER — FERUMOXYTOL INJECTION 510 MG/17 ML
510.0000 mg | INTRAVENOUS | Status: DC
  Administered 2012-03-20: 510 mg via INTRAVENOUS

## 2012-03-20 MED ORDER — SODIUM CHLORIDE 0.9 % IV SOLN
INTRAVENOUS | Status: DC
  Administered 2012-03-20: 12:00:00 via INTRAVENOUS

## 2012-03-21 NOTE — Discharge Summary (Signed)
Resident added code for Acute on Chronic Respiratory Failure. Okay by me.

## 2012-05-15 ENCOUNTER — Other Ambulatory Visit (HOSPITAL_COMMUNITY): Payer: Self-pay | Admitting: *Deleted

## 2012-05-15 MED ORDER — FERUMOXYTOL INJECTION 510 MG/17 ML: 510.0000 mg | INTRAVENOUS | Status: AC

## 2012-05-15 MED ORDER — SODIUM CHLORIDE 0.9 % IV SOLN: INTRAVENOUS | Status: AC

## 2012-05-16 ENCOUNTER — Encounter (HOSPITAL_COMMUNITY): Payer: Medicare (Managed Care)

## 2012-05-19 ENCOUNTER — Inpatient Hospital Stay (HOSPITAL_COMMUNITY): Admission: RE | Admit: 2012-05-19 | Payer: Medicare (Managed Care) | Source: Ambulatory Visit

## 2012-05-25 ENCOUNTER — Encounter (HOSPITAL_COMMUNITY)
Admission: RE | Admit: 2012-05-25 | Discharge: 2012-05-25 | Disposition: A | Discharge status: Home / Self Care | Payer: Medicare (Managed Care) | Source: Ambulatory Visit | Attending: Family Medicine | Admitting: Family Medicine

## 2012-05-25 DIAGNOSIS — D509 Iron deficiency anemia, unspecified: Secondary | ICD-10-CM | POA: Insufficient documentation

## 2012-05-25 MED ORDER — FERUMOXYTOL INJECTION 510 MG/17 ML
INTRAVENOUS | Status: AC
  Filled 2012-05-25: qty 17

## 2012-05-25 MED ORDER — SODIUM CHLORIDE 0.9 % IV SOLN
INTRAVENOUS | Status: DC
  Administered 2012-05-25: 09:00:00 via INTRAVENOUS

## 2012-05-25 MED ORDER — FERUMOXYTOL INJECTION 510 MG/17 ML
510.0000 mg | INTRAVENOUS | Status: DC
  Administered 2012-05-25: 510 mg via INTRAVENOUS

## 2012-05-26 ENCOUNTER — Encounter: Payer: Self-pay | Admitting: Cardiology

## 2012-06-07 ENCOUNTER — Encounter: Payer: Self-pay | Admitting: Cardiovascular Disease

## 2012-09-04 ENCOUNTER — Other Ambulatory Visit (HOSPITAL_COMMUNITY): Payer: Self-pay | Admitting: *Deleted

## 2012-09-05 ENCOUNTER — Encounter (HOSPITAL_COMMUNITY)
Admission: RE | Admit: 2012-09-05 | Discharge: 2012-09-05 | Disposition: A | Discharge status: Home / Self Care | Payer: Medicare (Managed Care) | Source: Ambulatory Visit | Attending: Family Medicine | Admitting: Family Medicine

## 2012-09-05 DIAGNOSIS — D509 Iron deficiency anemia, unspecified: Secondary | ICD-10-CM | POA: Insufficient documentation

## 2012-09-05 MED ORDER — FERUMOXYTOL INJECTION 510 MG/17 ML
INTRAVENOUS | Status: AC
  Administered 2012-09-05: 510 mg via INTRAVENOUS
  Filled 2012-09-05: qty 17

## 2012-09-05 MED ORDER — FERUMOXYTOL INJECTION 510 MG/17 ML
510.0000 mg | Freq: Once | INTRAVENOUS | Status: AC
  Administered 2012-09-05: 510 mg via INTRAVENOUS

## 2012-09-05 MED ORDER — SODIUM CHLORIDE 0.9 % IV SOLN
Freq: Once | INTRAVENOUS | Status: AC
  Administered 2012-09-05: 14:00:00 via INTRAVENOUS

## 2012-09-06 ENCOUNTER — Encounter (HOSPITAL_COMMUNITY): Payer: Medicare Other

## 2012-09-08 ENCOUNTER — Encounter (HOSPITAL_COMMUNITY): Payer: Self-pay | Admitting: Emergency Medicine

## 2012-09-08 ENCOUNTER — Inpatient Hospital Stay (HOSPITAL_COMMUNITY)
Admission: EM | Admit: 2012-09-08 | Discharge: 2012-09-13 | DRG: 280 | Disposition: A | Discharge status: Home / Self Care | Payer: Medicare (Managed Care) | Attending: Internal Medicine | Admitting: Internal Medicine

## 2012-09-08 ENCOUNTER — Emergency Department (HOSPITAL_COMMUNITY): Payer: Medicare (Managed Care)

## 2012-09-08 DIAGNOSIS — I10 Essential (primary) hypertension: Secondary | ICD-10-CM | POA: Diagnosis present

## 2012-09-08 DIAGNOSIS — G8929 Other chronic pain: Secondary | ICD-10-CM | POA: Diagnosis present

## 2012-09-08 DIAGNOSIS — I509 Heart failure, unspecified: Secondary | ICD-10-CM | POA: Diagnosis present

## 2012-09-08 DIAGNOSIS — G309 Alzheimer's disease, unspecified: Secondary | ICD-10-CM | POA: Diagnosis present

## 2012-09-08 DIAGNOSIS — N183 Chronic kidney disease, stage 3 unspecified: Secondary | ICD-10-CM | POA: Diagnosis present

## 2012-09-08 DIAGNOSIS — E119 Type 2 diabetes mellitus without complications: Secondary | ICD-10-CM | POA: Diagnosis present

## 2012-09-08 DIAGNOSIS — F329 Major depressive disorder, single episode, unspecified: Secondary | ICD-10-CM | POA: Diagnosis present

## 2012-09-08 DIAGNOSIS — D649 Anemia, unspecified: Secondary | ICD-10-CM | POA: Diagnosis present

## 2012-09-08 DIAGNOSIS — K219 Gastro-esophageal reflux disease without esophagitis: Secondary | ICD-10-CM | POA: Diagnosis present

## 2012-09-08 DIAGNOSIS — J9600 Acute respiratory failure, unspecified whether with hypoxia or hypercapnia: Secondary | ICD-10-CM | POA: Diagnosis present

## 2012-09-08 DIAGNOSIS — D696 Thrombocytopenia, unspecified: Secondary | ICD-10-CM | POA: Diagnosis not present

## 2012-09-08 DIAGNOSIS — Z8679 Personal history of other diseases of the circulatory system: Secondary | ICD-10-CM

## 2012-09-08 DIAGNOSIS — I5041 Acute combined systolic (congestive) and diastolic (congestive) heart failure: Secondary | ICD-10-CM

## 2012-09-08 DIAGNOSIS — Z87891 Personal history of nicotine dependence: Secondary | ICD-10-CM

## 2012-09-08 DIAGNOSIS — I129 Hypertensive chronic kidney disease with stage 1 through stage 4 chronic kidney disease, or unspecified chronic kidney disease: Secondary | ICD-10-CM | POA: Diagnosis present

## 2012-09-08 DIAGNOSIS — Z66 Do not resuscitate: Secondary | ICD-10-CM | POA: Diagnosis present

## 2012-09-08 DIAGNOSIS — Z79899 Other long term (current) drug therapy: Secondary | ICD-10-CM

## 2012-09-08 DIAGNOSIS — J810 Acute pulmonary edema: Secondary | ICD-10-CM

## 2012-09-08 DIAGNOSIS — J441 Chronic obstructive pulmonary disease with (acute) exacerbation: Secondary | ICD-10-CM | POA: Diagnosis present

## 2012-09-08 DIAGNOSIS — E7800 Pure hypercholesterolemia, unspecified: Secondary | ICD-10-CM | POA: Diagnosis present

## 2012-09-08 DIAGNOSIS — H3530 Unspecified macular degeneration: Secondary | ICD-10-CM | POA: Diagnosis present

## 2012-09-08 DIAGNOSIS — I498 Other specified cardiac arrhythmias: Secondary | ICD-10-CM | POA: Diagnosis present

## 2012-09-08 DIAGNOSIS — Z7902 Long term (current) use of antithrombotics/antiplatelets: Secondary | ICD-10-CM

## 2012-09-08 DIAGNOSIS — Z7982 Long term (current) use of aspirin: Secondary | ICD-10-CM

## 2012-09-08 DIAGNOSIS — D509 Iron deficiency anemia, unspecified: Secondary | ICD-10-CM

## 2012-09-08 DIAGNOSIS — E1142 Type 2 diabetes mellitus with diabetic polyneuropathy: Secondary | ICD-10-CM | POA: Diagnosis present

## 2012-09-08 DIAGNOSIS — E1149 Type 2 diabetes mellitus with other diabetic neurological complication: Secondary | ICD-10-CM | POA: Diagnosis present

## 2012-09-08 DIAGNOSIS — I428 Other cardiomyopathies: Secondary | ICD-10-CM | POA: Diagnosis present

## 2012-09-08 DIAGNOSIS — I447 Left bundle-branch block, unspecified: Secondary | ICD-10-CM | POA: Diagnosis present

## 2012-09-08 DIAGNOSIS — I2510 Atherosclerotic heart disease of native coronary artery without angina pectoris: Secondary | ICD-10-CM | POA: Diagnosis present

## 2012-09-08 DIAGNOSIS — Z8249 Family history of ischemic heart disease and other diseases of the circulatory system: Secondary | ICD-10-CM

## 2012-09-08 DIAGNOSIS — J9601 Acute respiratory failure with hypoxia: Secondary | ICD-10-CM

## 2012-09-08 DIAGNOSIS — Z9089 Acquired absence of other organs: Secondary | ICD-10-CM

## 2012-09-08 DIAGNOSIS — E785 Hyperlipidemia, unspecified: Secondary | ICD-10-CM | POA: Diagnosis present

## 2012-09-08 DIAGNOSIS — I214 Non-ST elevation (NSTEMI) myocardial infarction: Secondary | ICD-10-CM | POA: Diagnosis present

## 2012-09-08 DIAGNOSIS — I4891 Unspecified atrial fibrillation: Secondary | ICD-10-CM | POA: Diagnosis not present

## 2012-09-08 DIAGNOSIS — M1710 Unilateral primary osteoarthritis, unspecified knee: Secondary | ICD-10-CM | POA: Diagnosis present

## 2012-09-08 DIAGNOSIS — I5023 Acute on chronic systolic (congestive) heart failure: Principal | ICD-10-CM | POA: Diagnosis present

## 2012-09-08 DIAGNOSIS — F3289 Other specified depressive episodes: Secondary | ICD-10-CM | POA: Diagnosis present

## 2012-09-08 DIAGNOSIS — F0280 Dementia in other diseases classified elsewhere without behavioral disturbance: Secondary | ICD-10-CM | POA: Diagnosis present

## 2012-09-08 DIAGNOSIS — E559 Vitamin D deficiency, unspecified: Secondary | ICD-10-CM | POA: Diagnosis present

## 2012-09-08 DIAGNOSIS — Z823 Family history of stroke: Secondary | ICD-10-CM

## 2012-09-08 LAB — POCT I-STAT 3, ART BLOOD GAS (G3+)
Bicarbonate: 2.9 mEq/L — ABNORMAL LOW (ref 20.0–24.0)
O2 Saturation: 99 %
O2 Saturation: 98 %
TCO2: 23 mmol/L (ref 0–100)
TCO2: <5 mmol/L (ref 0–100)
pCO2 arterial: 37.7 mmHg (ref 35.0–45.0)
pO2, Arterial: 161 mmHg — ABNORMAL HIGH (ref 80.0–100.0)

## 2012-09-08 LAB — CBC WITH DIFFERENTIAL/PLATELET
Basophils Absolute: 0 10*3/uL (ref 0.0–0.1)
Eosinophils Absolute: 0 10*3/uL (ref 0.0–0.7)
Eosinophils Relative: 0 % (ref 0–5)
Lymphocytes Relative: 6 % — ABNORMAL LOW (ref 12–46)
MCV: 96.5 fL (ref 78.0–100.0)
Neutrophils Relative %: 91 % — ABNORMAL HIGH (ref 43–77)
Platelets: 169 10*3/uL (ref 150–400)
RDW: 13.7 % (ref 11.5–15.5)
WBC: 6.6 10*3/uL (ref 4.0–10.5)

## 2012-09-08 LAB — COMPREHENSIVE METABOLIC PANEL
ALT: 17 U/L (ref 0–35)
AST: 29 U/L (ref 0–37)
CO2: 24 mEq/L (ref 19–32)
Calcium: 9.3 mg/dL (ref 8.4–10.5)
Sodium: 133 mEq/L — ABNORMAL LOW (ref 135–145)
Total Protein: 6.5 g/dL (ref 6.0–8.3)

## 2012-09-08 MED ORDER — LIDOCAINE HCL (CARDIAC) 20 MG/ML IV SOLN
INTRAVENOUS | Status: AC
  Filled 2012-09-08: qty 5

## 2012-09-08 MED ORDER — ALBUTEROL SULFATE (5 MG/ML) 0.5% IN NEBU
2.5000 mg | INHALATION_SOLUTION | Freq: Four times a day (QID) | RESPIRATORY_TRACT | Status: DC
  Administered 2012-09-09: 2.5 mg via RESPIRATORY_TRACT
  Filled 2012-09-08: qty 0.5

## 2012-09-08 MED ORDER — FUROSEMIDE 10 MG/ML IJ SOLN
60.0000 mg | Freq: Two times a day (BID) | INTRAMUSCULAR | Status: DC
  Administered 2012-09-09 (×2): 60 mg via INTRAVENOUS
  Filled 2012-09-08 (×4): qty 6

## 2012-09-08 MED ORDER — ROCURONIUM BROMIDE 50 MG/5ML IV SOLN
INTRAVENOUS | Status: AC
  Filled 2012-09-08: qty 2

## 2012-09-08 MED ORDER — MORPHINE SULFATE 2 MG/ML IJ SOLN
2.0000 mg | Freq: Once | INTRAMUSCULAR | Status: AC
  Administered 2012-09-09: 2 mg via INTRAVENOUS
  Filled 2012-09-08: qty 1

## 2012-09-08 MED ORDER — IPRATROPIUM BROMIDE 0.02 % IN SOLN
0.5000 mg | Freq: Four times a day (QID) | RESPIRATORY_TRACT | Status: DC
  Administered 2012-09-09: 0.5 mg via RESPIRATORY_TRACT
  Filled 2012-09-08: qty 2.5

## 2012-09-08 MED ORDER — ETOMIDATE 2 MG/ML IV SOLN
INTRAVENOUS | Status: AC
  Filled 2012-09-08: qty 20

## 2012-09-08 MED ORDER — FUROSEMIDE 10 MG/ML IJ SOLN
60.0000 mg | Freq: Once | INTRAMUSCULAR | Status: AC
  Administered 2012-09-08: 60 mg via INTRAVENOUS
  Filled 2012-09-08: qty 6

## 2012-09-08 MED ORDER — INSULIN ASPART 100 UNIT/ML ~~LOC~~ SOLN
0.0000 [IU] | Freq: Four times a day (QID) | SUBCUTANEOUS | Status: DC
  Administered 2012-09-09: 3 [IU] via SUBCUTANEOUS
  Administered 2012-09-09 – 2012-09-10 (×2): 1 [IU] via SUBCUTANEOUS
  Administered 2012-09-11: 3 [IU] via SUBCUTANEOUS

## 2012-09-08 MED ORDER — SUCCINYLCHOLINE CHLORIDE 20 MG/ML IJ SOLN
INTRAMUSCULAR | Status: AC
  Filled 2012-09-08: qty 1

## 2012-09-08 NOTE — ED Notes (Signed)
Pending RT for transport to floor. Report received from TCT, RN.

## 2012-09-08 NOTE — ED Notes (Signed)
Pt comes from home per son pt has been getting progressively worse increased sob over 10 days. Pt was cyanotic upon EMS arrival and has been on O2 since 1815. Pt had rales in upper and lower lung fields. Resp rate was in 50's. Pt was given 6 Nitros and b/p was 180/110. Pt has a hx of COPD, and CHF. Pt was seen at PCP and was given a albuterol inhaler she used it twice with no relief.

## 2012-09-08 NOTE — ED Notes (Signed)
Pt states she does not feel like she can breathe any better.

## 2012-09-08 NOTE — Progress Notes (Signed)
ABG    Component Value Date/Time   PHART 7.048* 09/08/2012 2032   PCO2ART 10.4* 09/08/2012 2032   PO2ART 152.0* 09/08/2012 2032   HCO3 2.9* 09/08/2012 2032   TCO2 <5 09/08/2012 2032   ACIDBASEDEF 26.0* 09/08/2012 2032   O2SAT 98.0 09/08/2012 2032   This gas was entered under the wrong patient. POC was notified to correct this to the appropriate patient.

## 2012-09-08 NOTE — ED Provider Notes (Signed)
History    CSN: [id number] Arrival date & time 09/08/12  1916  First MD Initiated Contact with Patient 09/08/12 1925     Chief Complaint  Patient presents with  . Shortness of Breath   (Consider location/radiation/quality/duration/timing/severity/associated sxs/prior Treatment) HPI Comments: When EMS arrived patient was breathing 50 times a minute, cyanotic and in respiratory distress.  She was given 5 sublingual nitroglycerin due to severe hypertension and placed on BiPAP with significant improvement  Patient is a 79 y.o. [name] presenting with shortness of breath. The history is provided by the EMS personnel and a caregiver.  Shortness of Breath Severity:  Severe Onset quality:  Sudden Duration:  2 hours Timing:  Constant Progression:  Worsening Chronicity:  Recurrent Context comment:  States over the last 10 days has not felt well.  Initially last week given IM lasix for possible CHF and then on Monday treated for possible COPD with nebs and steroids.  States today felt ok earlier and then tonight developed severe SOB Relieved by:  Nothing Worsened by:  Activity Ineffective treatments:  Position changes and sitting up Associated symptoms: cough and diaphoresis   Associated symptoms: no abdominal pain, no chest pain, no fever, no neck pain, no sputum production and no vomiting   Risk factors: no recent alcohol use, no hx of PE/DVT, no prolonged immobilization and no recent surgery   Risk factors comment:  Chf and copd  Past Medical History  Diagnosis Date  . Alzheimer's disease   . 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 Chronic kidney disease, stage III (moderate)   . Unspecified vitamin D deficiency   . Arthritis     osteo  . Hypertension   . Hyperlipidemia   . Depression   . GERD (gastroesophageal reflux disease)   . Cataract   . Exudative senile macular degeneration of retina   . Retinal neovascularization NOS   . Chronic combined systolic and  diastolic heart failure   . Non-ischemic cardiomyopathy     EF 20-25% 2D 12/13  . PAF (paroxysmal atrial fibrillation)   . Pure hypercholesterolemia   . Esophageal reflux   . COPD (chronic obstructive pulmonary disease)   . Primary localized osteoarthrosis, lower leg   . Lumbago   . Vitamin D deficiency   . Macular degeneration   . Chronic pain     back  . Aortic stenosis, mild     echo 12/13  . Normal coronary arteries 10/09, 6/12  . LBBB (left bundle branch block)    Past Surgical History  Procedure Laterality Date  . Knee surgery  1997  . Appendectomy  1949  . Tonsillectomy    . Esophagogastroduodenoscopy  03/01/2012    Procedure: ESOPHAGOGASTRODUODENOSCOPY (EGD);  Surgeon: Graylin Shiver, MD;  Location: [facility] ENDOSCOPY;  Service: Endoscopy;  Laterality: N/A;  . Abdominal hysterectomy  1970   Family History  Problem Relation Age of Onset  . Stroke Mother   . Heart disease Father   . Cancer Brother    History  Substance Use Topics  . Smoking status: Former Smoker    Quit date: 05/27/2007  . Smokeless tobacco: Never Used  . Alcohol Use: No   OB History   Grav Para Term Preterm Abortions TAB SAB Ect Mult Living                 Review of Systems  Constitutional: Positive for diaphoresis. Negative for fever.  HENT: Negative for neck pain.   Respiratory: Positive for cough and  shortness of breath. Negative for sputum production.   Cardiovascular: Negative for chest pain.  Gastrointestinal: Negative for vomiting and abdominal pain.  All other systems reviewed and are negative.    Allergies  Darvon and Paroxetine  Home Medications   Current Outpatient Rx  Name  Route  Sig  Dispense  Refill  . acetaminophen (TYLENOL) 325 MG tablet   Oral   Take 650 mg by mouth every 4 (four) hours as needed.         Marland Kitchen albuterol (PROAIR HFA) 108 (90 BASE) MCG/ACT inhaler   Inhalation   Inhale 2 puffs into the lungs every 4 (four) hours as needed.   6.7 g   3   . Calcium  Carbonate (CALTRATE 600 PO)   Oral   Take by mouth daily.         . carvedilol (COREG) 6.25 MG tablet   Oral   Take 1 tablet (6.25 mg total) by mouth 2 (two) times daily with a meal.   60 tablet   2   . Chlorhexidine Gluconate Cloth 2 % PADS   Topical   Apply 6 each topically daily at 6 (six) AM.         . cholecalciferol (VITAMIN D) 1000 UNITS tablet   Oral   Take 1,000 Units by mouth daily.         . citalopram (CELEXA) 20 MG tablet   Oral   Take 20 mg by mouth daily.         . ferrous sulfate 325 (65 FE) MG tablet   Oral   Take 1 tablet (325 mg total) by mouth 2 (two) times daily with a meal.   60 tablet   2   . furosemide (LASIX) 40 MG tablet   Oral   Take 1 tablet (40 mg total) by mouth 2 (two) times daily.   60 tablet   1   . HYDROcodone-acetaminophen (VICODIN) 5-500 MG per tablet   Oral   Take 1 tablet by mouth every 6 (six) hours. For pain         . isosorbide mononitrate (IMDUR) 30 MG 24 hr tablet   Oral   Take 30 mg by mouth daily.         Marland Kitchen lisinopril (PRINIVIL,ZESTRIL) 10 MG tablet   Oral   Take 10 mg by mouth daily.         . Loperamide HCl (IMODIUM PO)   Oral   Take 1 tablet by mouth daily as needed.          . lovastatin (MEVACOR) 40 MG tablet   Oral   Take 40 mg by mouth at bedtime.         . Multiple Vitamins-Minerals (OCUVITE PO)   Oral   Take by mouth.         . mupirocin ointment (BACTROBAN) 2 %   Topical   Apply 1 application topically 2 (two) times daily.   22 g      . nystatin cream (MYCOSTATIN)   Topical   Apply 1 application topically daily as needed. For rash         . omeprazole (PRILOSEC) 20 MG capsule   Oral   Take 20 mg by mouth daily.         . potassium chloride (K-DUR) 10 MEQ tablet   Oral   Take 20 mEq by mouth 2 (two) times daily.         . predniSONE (DELTASONE) 10  MG tablet   Oral   Take 1 tablet (10 mg total) by mouth daily. Take 3 pills for 3 days.  Take 2 pills for 3 days.  Take 1 pill for 3 days   18 tablet   0   . spironolactone (ALDACTONE) 25 MG tablet   Oral   Take 1 tablet (25 mg total) by mouth daily.   30 tablet   1   . traZODone (DESYREL) 50 MG tablet   Oral   Take 1 tablet (50 mg total) by mouth at bedtime as needed for sleep.   30 tablet   0   . triamcinolone cream (KENALOG) 0.5 %   Topical   Apply topically 3 (three) times daily.         Marland Kitchen warfarin (COUMADIN) 2.5 MG tablet   Oral   Take 1 tablet (2.5 mg total) by mouth daily.   30 tablet   0    BP 155/91  Pulse 116  Resp 24  SpO2 100% Physical Exam  Nursing note and vitals reviewed. Constitutional: She is oriented to person, place, and time. She appears well-developed and well-nourished. No distress.  HENT:  Head: Normocephalic and atraumatic.  Mouth/Throat: Oropharynx is clear and moist.  Eyes: Conjunctivae and EOM are normal. Pupils are equal, round, and reactive to light.  Neck: Normal range of motion. Neck supple.  Cardiovascular: Regular rhythm and intact distal pulses.  Tachycardia present.   No murmur heard. Pulmonary/Chest: Accessory muscle usage present. Tachypnea noted. No respiratory distress. She has no wheezes. She has rales.  Abdominal: Soft. She exhibits no distension. There is no tenderness. There is no rebound and no guarding.  Musculoskeletal: Normal range of motion. She exhibits no edema and no tenderness.  Palpable peripheral pulses  Neurological: She is alert and oriented to person, place, and time.  Skin: Skin is warm and dry. No rash noted. No erythema.  Psychiatric: She has a normal mood and affect. Her behavior is normal.    ED Course  Procedures (including critical care time) Labs Reviewed  CBC WITH DIFFERENTIAL - Abnormal; Notable for the following:    RBC 3.13 (*)    Hemoglobin 10.2 (*)    HCT 30.2 (*)    Neutrophils Relative % 91 (*)    Lymphocytes Relative 6 (*)    Lymphs Abs 0.4 (*)    All other components within normal limits   COMPREHENSIVE METABOLIC PANEL - Abnormal; Notable for the following:    Sodium 133 (*)    Glucose, Bld 269 (*)    BUN 32 (*)    Creatinine, Ser 1.23 (*)    GFR calc non Af Amer 40 (*)    GFR calc Af Amer 47 (*)    All other components within normal limits  POCT I-STAT 3, BLOOD GAS (G3+) - Abnormal; Notable for the following:    pO2, Arterial 161.0 (*)    Acid-base deficit 3.0 (*)    All other components within normal limits  POCT I-STAT 3, BLOOD GAS (G3+) - Abnormal; Notable for the following:    pH, Arterial 7.048 (*)    pCO2 arterial 10.4 (*)    pO2, Arterial 152.0 (*)    Bicarbonate 2.9 (*)    Acid-base deficit 26.0 (*)    All other components within normal limits  PRO B NATRIURETIC PEPTIDE  CG4 I-STAT (LACTIC ACID)  POCT I-STAT TROPONIN I    Date: 09/08/2012  Rate: 115  Rhythm: sinus tachycardia  QRS Axis: normal  Intervals: normal  ST/T Wave abnormalities: nonspecific ST/T changes  Conduction Disutrbances:left bundle branch block  Narrative Interpretation:   Old EKG Reviewed: unchanged   No results found. CRITICAL CARE Performed by: Gwyneth Sprout Total critical care time: 30 Critical care time was exclusive of separately billable procedures and treating other patients. Critical care was necessary to treat or prevent imminent or life-threatening deterioration. Critical care was time spent personally by me on the following activities: development of treatment plan with patient and/or surrogate as well as nursing, discussions with consultants, evaluation of patient's response to treatment, examination of patient, obtaining history from patient or surrogate, ordering and performing treatments and interventions, ordering and review of laboratory studies, ordering and review of radiographic studies, pulse oximetry and re-evaluation of patient's condition.  1. Flash pulmonary edema   2. CHF (congestive heart failure), acute, combined     MDM   Patient with a prior  history of COPD, CHF who over the last 10 days and treated with Lasix, albuterol Atrovent, prednisone.  She arrived today with evidence of flash pulmonary edema on BiPAP and requiring nitroglycerin tablets. Upon arrival here patient appears to be improving. Her ABG was normal ruling out COPD exacerbation. She has rales bilaterally and feel patient's symptoms were related to flash pulmonary edema from CHF. Currently she is awake and alert blood pressure is in the 140s. We'll continue BiPAP at this time. Labs indicate CHF without renal insufficiency. Chest x-ray with bilateral fluid. No signs concerning for infection and patient given IV Lasix. Will admit for further care however when attempting to wean the BiPAP patient did not tolerate well will continue at this time.    Gwyneth Sprout, MD 09/08/12 2221

## 2012-09-08 NOTE — H&P (Signed)
Date: 09/08/2012               Patient Name:  [name] MRN: [id number]  DOB: 20-Dec-1929 Age / Sex: 79 y.o., female   PCP: Jethro Bastos, MD         Medical Service: Internal Medicine Teaching Service         Attending Physician: Dr. Kem Kays    First Contact: Dr. Sherrine Maples Pager: [phone]  Second Contact: Dr. Clyde Lundborg Pager: [phone]       After Hours (After 5p/  First Contact Pager: [phone]  weekends / holidays): Second Contact Pager: [phone]   Chief Complaint: Shortness of Breath  History of Present Illness:  79 y.o woman, patient of [facility] and [facility], with PMH of severe CHF(EF 20-25% in 02/2012), A. Fib, COPD, HTN, HLD, Diabetes, anemia, dementia and CKD who presents with acute dyspnea.   The history is provided by her son at bedside. The patient has been having SOB over the last 10 days for which she has been receiving treatment as COPD exacerbation by her PCP with Doxycycline since 4 days ago and Prednisone started today without much improvement. Patient was noticed to be in acute severe respiratory distress at around 6:00pm on evening evening of admission with everything happening rapidly. She was was cyanotic upon EMS arrival with Resp rate was in 50's and bibasilar rales. She was started on O2 therapy and transported to the ED and BiPAP was initiated with some improvement in dyspnea. Associated symptoms include wheezing, chest pain without a clear history of diaphoresis, or palpitations. She can not characterize her chest pain but she describes pain in her left shoulder which might be chronic as per her son. No increased lower extremity edema. No history of cough, fevers, or increased sputum production. She is compliant with her home regimen for heart failure including furosemide and spironolactone. On chart review, she has gained 16Lb over the last 6 months when she was admitted for a similar episode. No dietary indiscretions, excessive salt intake.   She has had multiple admissions for  acute exacerbation of CHF, with the most recently December, 2013.  She declined ICD or Life Vest.   Review of Systems: Constitutional: Denies fever, chills, diaphoresis, appetite change.  HEENT: Denies photophobia, eye pain, redness, ear pain, congestion, sore throat, rhinorrhea, sneezing, mouth sores, trouble swallowing, neck pain, neck stiffness and tinnitus.  Gastrointestinal: Denies nausea, vomiting, abdominal pain, blood in stool and abdominal distention. Reports chronic diarrhea.  Genitourinary: Denies dysuria, urgency, frequency, hematuria, flank pain and difficulty urinating.  Musculoskeletal: Denies myalgias, back pain, joint swelling, arthralgias and gait problem.  Skin: Denies pallor, rash and wound.  Neurological: Denies seizures, syncope, weakness, lightheadedness, numbness and headaches.  Hematological: Denies adenopathy. Easy bruising, personal or family bleeding history  Psychiatric/Behavioral: Denies suicidal ideation, mood changes, confusion, nervousness, sleep disturbance and agitation   Meds:  Medication Sig  . acetaminophen (TYLENOL) 325 MG tablet Take 650 mg by mouth every 4 (four) hours as needed.  Marland Kitchen albuterol (PROAIR HFA) 108 (90 BASE) MCG/ACT inhaler Inhale 2 puffs into the lungs every 4 (four) hours as needed.  . Calcium Carbonate (CALTRATE 600 PO) Take 1 tablet by mouth daily.   . carvedilol (COREG) 6.25 MG tablet Take 1 tablet (6.25 mg total) by mouth 2 (two) times daily with a meal.  . Chlorhexidine Gluconate Cloth 2 % PADS Apply 6 each topically daily at 6 (six) AM.  . cholecalciferol (VITAMIN D) 1000 UNITS tablet Take 1,000 Units  by mouth daily.  . citalopram (CELEXA) 20 MG tablet Take 20 mg by mouth daily.  Marland Kitchen doxycycline (VIBRA-TABS) 100 MG tablet Take 100 mg by mouth 2 (two) times daily.  . ferrous sulfate 325 (65 FE) MG tablet Take 1 tablet (325 mg total) by mouth 2 (two) times daily with a meal.  . furosemide (LASIX) 40 MG tablet Take 1 tablet (40 mg  total) by mouth 2 (two) times daily.  Marland Kitchen HYDROcodone-acetaminophen (VICODIN) 5-500 MG per tablet Take 1 tablet by mouth every 6 (six) hours. For pain  . isosorbide mononitrate (IMDUR) 30 MG 24 hr tablet Take 30 mg by mouth daily.  Marland Kitchen lisinopril (PRINIVIL,ZESTRIL) 10 MG tablet Take 10 mg by mouth daily.  . Loperamide HCl (IMODIUM PO) Take 1 tablet by mouth daily as needed (loose stools).   Marland Kitchen lovastatin (MEVACOR) 40 MG tablet Take 40 mg by mouth at bedtime.  . Multiple Vitamins-Minerals (OCUVITE PO) Take 1 tablet by mouth daily.   . mupirocin ointment (BACTROBAN) 2 % Apply 1 application topically 2 (two) times daily.  Marland Kitchen nystatin cream (MYCOSTATIN) Apply 1 application topically daily as needed. For rash  . omeprazole (PRILOSEC) 20 MG capsule Take 20 mg by mouth daily.  . potassium chloride (K-DUR) 10 MEQ tablet Take 20 mEq by mouth 2 (two) times daily.  . predniSONE (DELTASONE) 20 MG tablet Take 40 mg by mouth daily.  Marland Kitchen spironolactone (ALDACTONE) 25 MG tablet Take 1 tablet (25 mg total) by mouth daily.  . traZODone (DESYREL) 50 MG tablet Take 1 tablet (50 mg total) by mouth at bedtime as needed for sleep.  Marland Kitchen triamcinolone cream (KENALOG) 0.5 % Apply topically 3 (three) times daily.    Allergies: Allergies as of 09/08/2012 - Review Complete 09/08/2012  Allergen Reaction Noted  . Darvon (propoxyphene hcl) Nausea Only 05/25/2012  . Paroxetine Other (See Comments)    Past Medical History  Diagnosis Date  . Alzheimer's disease   . 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 Chronic kidney disease, stage III (moderate)   . Unspecified vitamin D deficiency   . Arthritis     osteo  . Hypertension   . Hyperlipidemia   . Depression   . GERD (gastroesophageal reflux disease)   . Cataract   . Exudative senile macular degeneration of retina   . Retinal neovascularization NOS   . Chronic combined systolic and diastolic heart failure   . Non-ischemic cardiomyopathy     EF 20-25% 2D 12/13   . PAF (paroxysmal atrial fibrillation)   . Pure hypercholesterolemia   . Esophageal reflux   . COPD (chronic obstructive pulmonary disease)   . Primary localized osteoarthrosis, lower leg   . Lumbago   . Vitamin D deficiency   . Macular degeneration   . Chronic pain     back  . Aortic stenosis, mild     echo 12/13  . Normal coronary arteries 10/09, 6/12  . LBBB (left bundle branch block)    Past Surgical History  Procedure Laterality Date  . Knee surgery  1997  . Appendectomy  1949  . Tonsillectomy    . Esophagogastroduodenoscopy  03/01/2012    Procedure: ESOPHAGOGASTRODUODENOSCOPY (EGD);  Surgeon: Graylin Shiver, MD;  Location: [facility] ENDOSCOPY;  Service: Endoscopy;  Laterality: N/A;  . Abdominal hysterectomy  1970   Family History  Problem Relation Age of Onset  . Stroke Mother   . Heart disease Father   . Cancer Brother    History   Social History  .  Marital Status: Widowed    Spouse Name: N/A    Number of Children: N/A  . Years of Education: N/A   Occupational History  . Not on file.   Social History Main Topics  . Smoking status: Former Smoker    Quit date: 05/27/2007  . Smokeless tobacco: Never Used  . Alcohol Use: No  . Drug Use: No  . Sexually Active: Not on file   Other Topics Concern  . Not on file   Social History Narrative  . No narrative on file    Physical Exam: Blood pressure 162/87, pulse 84, resp. rate 19, weight 173 lb 2 oz (78.529 kg), SpO2 99.00%. General: Thin old woman, on BiPAP, unable to provide much history, in acute moderate distressed, cooperative with exam Head: atraumatic, normocephalic,  Eye: pupils equal, round and reactive; sclera anicteric; normal conjunctiva  Nose/throat: oropharynx clear, moist mucous membranes, pink gums  Neck: supple, no carotid bruits, thyroid normal in size and without palpable nodules  Lungs/Chest wall: No chest wall tenderness, bibasilar rales right mor than left, increased work of breathing  Heart:  tachycardia, regular rhythm; no murmurs. JVD distended to the level of the mandible  Pulses: radial and dorsalis pedis pulses are 2+ and symmetric  Abdomen: Normal fullness, no rebound, guarding, or rigidity; normal bowel sounds; no masses or organomegaly  Skin: warm, dry, intact, normal turgor, no rashes  Extremities: No peripheral edema, clubbing, or cyanosis Neurologic: A&O X3, CN II - XII are grossly intact. Motor strength is 5/5 in the all 4 extremities, Sensations intact to light touch. Psych: patient is alert and oriented, mood and affect are normal and congruent, thought content is normal without delusions, thought process is linear, speech is normal and non-pressured, behavior is normal    Lab results: Basic Metabolic Panel:  Recent Labs  16/10/96 1925  NA 133*  K 5.0  CL 98  CO2 24  GLUCOSE 269*  BUN 32*  CREATININE 1.23*  CALCIUM 9.3  AG: 11   Liver Function Tests:  Recent Labs  09/08/12 1925  AST 29  ALT 17  ALKPHOS 89  BILITOT 1.0  PROT 6.5  ALBUMIN 3.7   CBC:  Recent Labs  09/08/12 1925  WBC 6.6  NEUTROABS 6.0  HGB 10.2*  HCT 30.2*  MCV 96.5  PLT 169     09/08/2012   Sample type ARTERIAL  pH, Arterial 7.372  pCO2 arterial 37.7  pO2, Arterial 161  Bicarbonate 21.9  TCO2 23  Acid-base deficit -3  O2 Saturation 99.0    Recent Labs Lab 03/03/2012 09/08/12 2329  PROBNP 13270 8855.0*     Recent Labs Lab 09/09/12 0018 09/09/12 0415  TROPONINI 0.41* 0.40*    Imaging results:  Dg Chest Port 1 View  09/08/2012   *RADIOLOGY REPORT*  Clinical Data: Severe shortness of breath  PORTABLE CHEST - 1 VIEW  Comparison: 03/03/2012; 02/29/2012  Findings: Grossly unchanged enlarged cardiac silhouette and mediastinal contours gave an decreased lung volumes.  The pulmonary vascular sure is indistinct with cephalization of flow.  Interval development of at least a small sized bilateral pleural effusions with worsening bibasilar heterogeneous /  consolidative opacities, right greater than left.  No definite pneumothorax.  Chronic deformity of the right glenoid.  IMPRESSION: Findings compatible with pulmonary edema with at least small sized bilateral effusions and associated bibasilar opacities, atelectasis versus infiltrate.   Original Report Authenticated By: Tacey Ruiz, MD    Other results: EKG: sinus tachycardia, LBBB, TWI in  V6 but no STE/STD.  Assessment & Plan by Problem: 79 y.o woman, patient of [facility] and [facility], with PMH of severe CHF(EF 20-25% in 02/2012), A. Fib, COPD, HTN, HLD, Diabetes, anemia, dementia and CKD who presents with acute dyspnea.   Principal Problem:   Acute respiratory failure Active Problems:   DM   HYPERTENSION   Systolic CHF, acute on chronic (worsening LV function) EF of 20-25% on echo (12/16)   History of atrial fibrillation   Dementia in Alzheimer's disease, mild   Normocytic anemia   CKD (chronic kidney disease) stage 3, GFR 30-59 ml/min  # Acute on Chronic Systolic HF: EF 20-25% on echo of 02/28/2012. Likely trigger is atrial fibrillation or ACS however, features of cardiac ischemia on EKG and iStat trop is negative. PE unlikely with Well's score of 1.5 points (1.3% pretest proba). Family reports compliance with medications and not dietary indescritions. Her weight is up by 16Lb (157>>173) from her previous admission in 02/2012. JVD is elevated and has impressive bibasilar rales but current Pro BNP 8855<<13270 (03/03/2012). CXR reveals pulmonary edema with effusions. She became more dyspneic when BiPAP was temporarily removed. Even though the patient was being treated as COPD exacerbation, clinical presentation does not appear to be consistent with this. No increased cough, sputum or wheezing.   Plan  - admit to SDU - Morphine 2 mg q4 prn for chest pain  - switch to I. V Lasix 60 mg bid  - Strict in/out  - daily weight  - fluid restriction - Coreg 6.25 mg twice daily. - Imdur 30 mg daily. -  Lisinopril 10 mg daily. - Spironolactone  25 mg daily - Family and patient decided against ICD or Life Vest. Pt is DNR/DNI - monitor renal function  - Discontinue Prednisone and Docycycline  # Elevated Troponin: iStat troponin negative. Repeat troponin 0.41>>0.40. This likely related to acute CHF causing demand cardia ischemia. She reports left shoulder pain but she has tenderness of the same shoulder on palpation which is inconsistent with referred cardiac pain. TIMI score is 3 (13% 14 day risk). No EKG changes. FOBT negative.  Plan  - Start heparin gtt - AM EKG - Trend troponin - Monitor for chest pain   # A.Fibrillation: This appears to be intermittent. Her CHAD2 score is 4. She has been initiated on coumadin but family decided not to continue with it after a discussion with PCP about risks and benefits of anticoagulation in the setting in probable GI bleeding even though endoscope has been negative. EKG currently no active A. Fib on EKG. She is on coreg at home  Plan  - continue with Coreg  - will keep on telemetry  # HTN: BP current stable. Will continue with medication above under CHF.   # DM: She does not take hypoglycemics. Had 40 mg of oral prednisone on the day of admission. CBG 269.  Plan  SSI-S  # Normocytic Anemia:  Baseline hemoglobin of around 10. As her ECP note, anemia is presumed to be from GI bleeding, likely from diverticulosis. On 05/08/2012 she received an iron effusion. FOBT +ve in 08/2010, negative in 02/2012. Unclear whether she has had colonoscopy. No h/o of bloody stools or melena.   # CKD 3: Baseline GFR of 45-56 with a creatinine of around 1.2.  Will monitor renal function in the setting of Acute CHF and diuretic therapy.   # DVT PPt: Heparin  # Code Status: DNR/DNI    Dispo: Disposition is deferred at this time,  awaiting improvement of current medical problems. Anticipated discharge in approximately 2-3 day(s).   The patient does have a current PCP  Jethro Bastos, MD) and does not need an [facility] hospital follow-up appointment after discharge.  The patient does not have transportation limitations that hinder transportation to clinic appointments.  Signed:  Dow Adolph, MD PGY-1 Internal Medicine Teaching Service Pager: [phone] (7pm-7am) 09/09/2012, 12:13 AM

## 2012-09-08 NOTE — ED Notes (Signed)
Resp therapy by bedside. Pt placed on Bipap

## 2012-09-08 NOTE — ED Notes (Signed)
Per Pt's son, [facility] and pt received an iron infusion on Tuesday due to anemia and was sent home with an inhaler, they seemed to be working but then she got progressively worse. Pt went on a trip to [employer] and flew by airplane prior to the difficulty breathing.

## 2012-09-09 DIAGNOSIS — J810 Acute pulmonary edema: Secondary | ICD-10-CM

## 2012-09-09 DIAGNOSIS — J441 Chronic obstructive pulmonary disease with (acute) exacerbation: Secondary | ICD-10-CM

## 2012-09-09 DIAGNOSIS — I214 Non-ST elevation (NSTEMI) myocardial infarction: Secondary | ICD-10-CM | POA: Diagnosis present

## 2012-09-09 DIAGNOSIS — D649 Anemia, unspecified: Secondary | ICD-10-CM

## 2012-09-09 DIAGNOSIS — I5023 Acute on chronic systolic (congestive) heart failure: Principal | ICD-10-CM

## 2012-09-09 DIAGNOSIS — J9600 Acute respiratory failure, unspecified whether with hypoxia or hypercapnia: Secondary | ICD-10-CM

## 2012-09-09 LAB — BLOOD GAS, ARTERIAL
Bicarbonate: 22.6 mEq/L (ref 20.0–24.0)
Delivery systems: POSITIVE
Drawn by: 283381
Expiratory PAP: 6
Inspiratory PAP: 12
pCO2 arterial: 48.8 mmHg — ABNORMAL HIGH (ref 35.0–45.0)
pH, Arterial: 7.287 — ABNORMAL LOW (ref 7.350–7.450)
pO2, Arterial: 77.8 mmHg — ABNORMAL LOW (ref 80.0–100.0)

## 2012-09-09 LAB — CBC
HCT: 26.7 % — ABNORMAL LOW (ref 36.0–46.0)
Hemoglobin: 9 g/dL — ABNORMAL LOW (ref 12.0–15.0)
MCH: 32.5 pg (ref 26.0–34.0)
MCV: 95.4 fL (ref 78.0–100.0)
MCV: 96.7 fL (ref 78.0–100.0)
Platelets: 86 10*3/uL — ABNORMAL LOW (ref 150–400)
RBC: 2.71 MIL/uL — ABNORMAL LOW (ref 3.87–5.11)
RDW: 13.4 % (ref 11.5–15.5)
RDW: 13.9 % (ref 11.5–15.5)
WBC: 4.8 10*3/uL (ref 4.0–10.5)

## 2012-09-09 LAB — PROTIME-INR
INR: 1.2 (ref 0.00–1.49)
Prothrombin Time: 14.9 seconds (ref 11.6–15.2)

## 2012-09-09 LAB — BASIC METABOLIC PANEL
BUN: 32 mg/dL — ABNORMAL HIGH (ref 6–23)
CO2: 25 mEq/L (ref 19–32)
Chloride: 101 mEq/L (ref 96–112)
Creatinine, Ser: 1.26 mg/dL — ABNORMAL HIGH (ref 0.50–1.10)
Potassium: 4.5 mEq/L (ref 3.5–5.1)

## 2012-09-09 LAB — GLUCOSE, CAPILLARY
Glucose-Capillary: 141 mg/dL — ABNORMAL HIGH (ref 70–99)
Glucose-Capillary: 118 mg/dL — ABNORMAL HIGH (ref 70–99)

## 2012-09-09 LAB — MAGNESIUM: Magnesium: 1.7 mg/dL (ref 1.5–2.5)

## 2012-09-09 LAB — TROPONIN I
Troponin I: <0.3 ng/mL (ref <0.30)
Troponin I: 0.98 ng/mL (ref <0.30)

## 2012-09-09 LAB — PHOSPHORUS: Phosphorus: 4.2 mg/dL (ref 2.3–4.6)

## 2012-09-09 LAB — OCCULT BLOOD, POC DEVICE: Fecal Occult Bld: NEGATIVE

## 2012-09-09 MED ORDER — CHLORHEXIDINE GLUCONATE CLOTH 2 % EX PADS
6.0000 | MEDICATED_PAD | Freq: Every day | CUTANEOUS | Status: AC
  Administered 2012-09-09 – 2012-09-13 (×5): 6 via TOPICAL

## 2012-09-09 MED ORDER — ALBUTEROL SULFATE (5 MG/ML) 0.5% IN NEBU
2.5000 mg | INHALATION_SOLUTION | RESPIRATORY_TRACT | Status: DC | PRN
Start: 2012-09-09 — End: 2012-09-09

## 2012-09-09 MED ORDER — NITROGLYCERIN 0.4 MG SL SUBL: 0.4000 mg | SUBLINGUAL_TABLET | SUBLINGUAL | Status: DC | PRN

## 2012-09-09 MED ORDER — CARVEDILOL 6.25 MG PO TABS
6.2500 mg | ORAL_TABLET | Freq: Two times a day (BID) | ORAL | Status: DC
  Administered 2012-09-09 – 2012-09-13 (×9): 6.25 mg via ORAL
  Filled 2012-09-09 (×11): qty 1

## 2012-09-09 MED ORDER — IPRATROPIUM BROMIDE 0.02 % IN SOLN
0.5000 mg | Freq: Four times a day (QID) | RESPIRATORY_TRACT | Status: DC
  Administered 2012-09-09 – 2012-09-13 (×16): 0.5 mg via RESPIRATORY_TRACT
  Filled 2012-09-09 (×19): qty 2.5

## 2012-09-09 MED ORDER — ISOSORBIDE MONONITRATE ER 30 MG PO TB24
30.0000 mg | ORAL_TABLET | Freq: Every day | ORAL | Status: DC
  Administered 2012-09-09 – 2012-09-13 (×5): 30 mg via ORAL
  Filled 2012-09-09 (×5): qty 1

## 2012-09-09 MED ORDER — IPRATROPIUM BROMIDE 0.02 % IN SOLN: 0.5000 mg | Freq: Three times a day (TID) | RESPIRATORY_TRACT | Status: DC

## 2012-09-09 MED ORDER — SIMVASTATIN 20 MG PO TABS
20.0000 mg | ORAL_TABLET | Freq: Every day | ORAL | Status: DC
  Administered 2012-09-09 – 2012-09-12 (×4): 20 mg via ORAL
  Filled 2012-09-09 (×5): qty 1

## 2012-09-09 MED ORDER — MORPHINE SULFATE 2 MG/ML IJ SOLN
1.0000 mg | INTRAMUSCULAR | Status: DC | PRN
  Administered 2012-09-09 – 2012-09-12 (×11): 2 mg via INTRAVENOUS
  Filled 2012-09-09 (×11): qty 1

## 2012-09-09 MED ORDER — PANTOPRAZOLE SODIUM 40 MG PO TBEC
40.0000 mg | DELAYED_RELEASE_TABLET | Freq: Every day | ORAL | Status: DC
  Administered 2012-09-09 – 2012-09-13 (×5): 40 mg via ORAL
  Filled 2012-09-09 (×4): qty 1

## 2012-09-09 MED ORDER — LISINOPRIL 10 MG PO TABS
10.0000 mg | ORAL_TABLET | Freq: Every day | ORAL | Status: DC
  Administered 2012-09-09 – 2012-09-13 (×5): 10 mg via ORAL
  Filled 2012-09-09 (×5): qty 1

## 2012-09-09 MED ORDER — LEVALBUTEROL HCL 0.63 MG/3ML IN NEBU
0.6300 mg | INHALATION_SOLUTION | Freq: Four times a day (QID) | RESPIRATORY_TRACT | Status: DC
  Administered 2012-09-09 – 2012-09-13 (×15): 0.63 mg via RESPIRATORY_TRACT
  Filled 2012-09-09 (×29): qty 3

## 2012-09-09 MED ORDER — HEPARIN SODIUM (PORCINE) 5000 UNIT/ML IJ SOLN: 5000.0000 [IU] | Freq: Three times a day (TID) | INTRAMUSCULAR | Status: DC

## 2012-09-09 MED ORDER — ASPIRIN EC 81 MG PO TBEC
81.0000 mg | DELAYED_RELEASE_TABLET | Freq: Every day | ORAL | Status: DC
  Administered 2012-09-09 – 2012-09-13 (×5): 81 mg via ORAL
  Filled 2012-09-09 (×5): qty 1

## 2012-09-09 MED ORDER — ALBUTEROL SULFATE (5 MG/ML) 0.5% IN NEBU: 2.5000 mg | INHALATION_SOLUTION | Freq: Three times a day (TID) | RESPIRATORY_TRACT | Status: DC

## 2012-09-09 MED ORDER — HEPARIN (PORCINE) IN NACL 100-0.45 UNIT/ML-% IJ SOLN
950.0000 [IU]/h | INTRAMUSCULAR | Status: DC
  Administered 2012-09-09: 800 [IU]/h via INTRAVENOUS
  Filled 2012-09-09 (×2): qty 250

## 2012-09-09 MED ORDER — HEPARIN BOLUS VIA INFUSION
4000.0000 [IU] | Freq: Once | INTRAVENOUS | Status: AC
  Administered 2012-09-09: 4000 [IU] via INTRAVENOUS
  Filled 2012-09-09: qty 4000

## 2012-09-09 MED ORDER — SPIRONOLACTONE 25 MG PO TABS
25.0000 mg | ORAL_TABLET | Freq: Every day | ORAL | Status: DC
  Administered 2012-09-09 – 2012-09-13 (×5): 25 mg via ORAL
  Filled 2012-09-09 (×5): qty 1

## 2012-09-09 MED ORDER — CITALOPRAM HYDROBROMIDE 20 MG PO TABS
20.0000 mg | ORAL_TABLET | Freq: Every day | ORAL | Status: DC
  Administered 2012-09-09 – 2012-09-13 (×5): 20 mg via ORAL
  Filled 2012-09-09 (×5): qty 1

## 2012-09-09 MED ORDER — MUPIROCIN 2 % EX OINT
1.0000 "application " | TOPICAL_OINTMENT | Freq: Two times a day (BID) | CUTANEOUS | Status: DC
  Administered 2012-09-09 – 2012-09-13 (×8): 1 via NASAL
  Filled 2012-09-09 (×2): qty 22

## 2012-09-09 MED ORDER — ALBUTEROL SULFATE (5 MG/ML) 0.5% IN NEBU: 2.5000 mg | INHALATION_SOLUTION | Freq: Four times a day (QID) | RESPIRATORY_TRACT | Status: DC

## 2012-09-09 MED ORDER — SODIUM CHLORIDE 0.9 % IJ SOLN
3.0000 mL | Freq: Two times a day (BID) | INTRAMUSCULAR | Status: DC
  Administered 2012-09-09 – 2012-09-12 (×6): 3 mL via INTRAVENOUS

## 2012-09-09 NOTE — Progress Notes (Signed)
Subjective:  - Patient feels slightly better, but still requires BiPAP. No fever or chills. HR 60 to 120/min - Urine was not recorded correctly. - Cre 1.23-->1.26 - Hgb 10.2-->9.7; Platelet 169-->97.    Objective: Vital signs in last 24 hours: Filed Vitals:   09/09/12 1133 09/09/12 1155 09/09/12 1409 09/09/12 1510  BP: 112/87     Pulse: 82 64  62  Temp: 98 F (36.7 C)     TempSrc: Axillary     Resp:  16  16  Height:      Weight:      SpO2: 95% 98% 100% 100%   Weight change:   Intake/Output Summary (Last 24 hours) at 09/09/12 1511 Last data filed at 09/09/12 1300  Gross per 24 hour  Intake   74.4 ml  Output    550 ml  Net -475.6 ml    Physical Exam:   Filed Vitals:   09/09/12 1133 09/09/12 1155 09/09/12 1409 09/09/12 1510  BP: 112/87     Pulse: 82 64  62  Temp: 98 F (36.7 C)     TempSrc: Axillary     Resp:  16  16  Height:      Weight:      SpO2: 95% 98% 100% 100%    General: Not in acute distress HEENT: PERRL, EOMI, no scleral icterus, No JVD or bruit Cardiac: S1/S2, RRR, No murmurs, gallops or rubs Pulm: bibasilar rales, R>L, increased work of breathing  Heart: tachycardia, regular rhythm; no murmurs. JVD distended.  Abd: Soft, nondistended, nontender, no rebound pain, no organomegaly, BS present Ext: 1+ pitting edema. 2+DP/PT pulse bilaterally Musculoskeletal: No joint deformities, erythema, or stiffness, ROM full Skin: No rashes.  Neuro: Alert and oriented X3, cranial nerves II-XII grossly intact, muscle strength 5/5 in all extremeties, sensation to light touch intact. Brachial reflex 2+ bilaterally. Knee reflex 1+ bilaterally. Negative Babinski's sign. Normal finger to nose test. Psych: Patient is not psychotic, no suicidal or hemocidal ideation.  Lab Results: Basic Metabolic Panel:  Recent Labs Lab 09/08/12 1925 09/09/12 0018 09/09/12 0400  NA 133*  --  137  K 5.0  --  4.5  CL 98  --  101  CO2 24  --  25  GLUCOSE 269*  --  168*  BUN 32*   --  32*  CREATININE 1.23*  --  1.26*  CALCIUM 9.3  --  9.0  MG  --  1.7  --   PHOS  --  4.2  --    Liver Function Tests:  Recent Labs Lab 09/08/12 1925  AST 29  ALT 17  ALKPHOS 89  BILITOT 1.0  PROT 6.5  ALBUMIN 3.7   No results found for this basename: LIPASE, AMYLASE,  in the last 168 hours No results found for this basename: AMMONIA,  in the last 168 hours CBC:  Recent Labs Lab 09/08/12 1925 09/09/12 0400  WBC 6.6 4.8  NEUTROABS 6.0  --   HGB 10.2* 9.0*  HCT 30.2* 26.7*  MCV 96.5 95.4  PLT 169 97*   Cardiac Enzymes:  Recent Labs Lab 09/09/12 0018 09/09/12 0415 09/09/12 1102  TROPONINI 0.41* 0.40* <0.30   BNP:  Recent Labs Lab 09/08/12 2329  PROBNP 8855.0*   D-Dimer: No results found for this basename: DDIMER,  in the last 168 hours CBG:  Recent Labs Lab 09/09/12 0028 09/09/12 0636 09/09/12 0811  GLUCAP 208* 141* 131*   Hemoglobin A1C: No results found for this basename: HGBA1C,  in  the last 168 hours Fasting Lipid Panel: No results found for this basename: CHOL, HDL, LDLCALC, TRIG, CHOLHDL, LDLDIRECT,  in the last 168 hours Thyroid Function Tests: No results found for this basename: TSH, T4TOTAL, FREET4, T3FREE, THYROIDAB,  in the last 168 hours Coagulation:  Recent Labs Lab 09/09/12 0217  LABPROT 14.9  INR 1.20   Anemia Panel: No results found for this basename: VITAMINB12, FOLATE, FERRITIN, TIBC, IRON, RETICCTPCT,  in the last 168 hours Urine Drug Screen: Drugs of Abuse  No results found for this basename: labopia,  cocainscrnur,  labbenz,  amphetmu,  thcu,  labbarb    Alcohol Level: No results found for this basename: ETH,  in the last 168 hours Urinalysis: No results found for this basename: COLORURINE, APPERANCEUR, LABSPEC, PHURINE, GLUCOSEU, HGBUR, BILIRUBINUR, KETONESUR, PROTEINUR, UROBILINOGEN, NITRITE, LEUKOCYTESUR,  in the last 168 hours Misc. Labs:  Micro Results: Recent Results (from the past 240 hour(s))  MRSA PCR  SCREENING     Status: Abnormal   Collection Time    09/09/12 12:18 AM      Result Value Range Status   MRSA by PCR POSITIVE (*) NEGATIVE Final   Comment:            The GeneXpert MRSA Assay (FDA     approved for NASAL specimens     only), is one component of a     comprehensive MRSA colonization     surveillance program. It is not     intended to diagnose MRSA     infection nor to guide or     monitor treatment for     MRSA infections.     RESULT CALLED TO, READ BACK BY AND VERIFIED WITH:     CALLED TO RN [facility] [employer] [id number] @[id number]    Studies/Results: Dg Chest Port 1 View  09/08/2012   *RADIOLOGY REPORT*  Clinical Data: Severe shortness of breath  PORTABLE CHEST - 1 VIEW  Comparison: 03/03/2012; 02/29/2012  Findings: Grossly unchanged enlarged cardiac silhouette and mediastinal contours gave an decreased lung volumes.  The pulmonary vascular sure is indistinct with cephalization of flow.  Interval development of at least a small sized bilateral pleural effusions with worsening bibasilar heterogeneous / consolidative opacities, right greater than left.  No definite pneumothorax.  Chronic deformity of the right glenoid.  IMPRESSION: Findings compatible with pulmonary edema with at least small sized bilateral effusions and associated bibasilar opacities, atelectasis versus infiltrate.   Original Report Authenticated By: Tacey Ruiz, MD   Medications:  Scheduled Meds: . aspirin EC  81 mg Oral Daily  . carvedilol  6.25 mg Oral BID WC  . Chlorhexidine Gluconate Cloth  6 each Topical Q0600  . citalopram  20 mg Oral Daily  . furosemide  60 mg Intravenous BID  . insulin aspart  0-9 Units Subcutaneous Q6H  . ipratropium  0.5 mg Nebulization Q6H  . isosorbide mononitrate  30 mg Oral Daily  . levalbuterol  0.63 mg Nebulization Q6H  . lisinopril  10 mg Oral Daily  . mupirocin ointment  1 application Nasal BID  . pantoprazole  40 mg Oral Daily  . simvastatin  20 mg Oral q1800  . sodium chloride   3 mL Intravenous Q12H  . spironolactone  25 mg Oral Daily   Continuous Infusions:   PRN Meds:.morphine injection, nitroGLYCERIN Assessment/Plan:  # Acute on Chronic Systolic HF: EF 20-25% on echo of 02/28/2012. Likely trigger is atrial fibrillation or ACS however, features of cardiac ischemia on EKG and  iStat trop is negative. PE unlikely with Well's score of 1.5 points (1.3% pretest proba). Family reports compliance with medications and not dietary indescritions. Her weight is up by 16Lb (157>>173) from her previous admission in 02/2012. JVD is elevated and has impressive bibasilar rales but current Pro BNP 8855<<13270 (03/03/2012). CXR reveals pulmonary edema with effusions. She became more dyspneic when BiPAP was temporarily removed. Even though the patient was being treated as COPD exacerbation, clinical presentation does not appear to be consistent with this. No increased cough, sputum or wheezing. patient feels better with diuresis. Cre is stable. 1.23-->1.26  Plan   - Morphine 2 mg q4 prn for chest pain  - continue IV Lasix 60 mg bid   - Strict in/out, daily weight , fluid restriction - Coreg 6.25 mg twice daily. - Imdur 30 mg daily. - Lisinopril 10 mg daily. - Spironolactone  25 mg daily - Family and patient decided against ICD or Life Vest. Pt is DNR/DNI - monitor renal function   - switch albuterol to Xopenex.  # Elevated Troponin:  iStat troponin negative. Repeat troponin 0.41>>0.40>>0.30. This likely related to acute CHF causing demand cardia ischemia. TIMI score is 3 (13% 14 day risk). No EKG changes. FOBT negative.   Plan   - will d/c  heparin gtt - Trend troponin - Monitor for chest pain   # A.Fibrillation: This appears to be intermittent. Her CHAD2 score is 4. She has been initiated on coumadin but family decided not to continue with it after a discussion with PCP about risks and benefits of anticoagulation in the setting in probable GI bleeding even though endoscope has  been negative. EKG currently no active A. Fib on EKG. She is on coreg at home   Plan   - continue with Coreg   - will keep on telemetry  # HTN: BP current stable. Will continue with medication above under CHF.   # DM: She does not take hypoglycemics. Had 40 mg of oral prednisone on the day of admission. CBG 269.   Plan   SSI-S  # Normocytic Anemia:  Baseline hemoglobin of around 10. As her ECP note, anemia is presumed to be from GI bleeding, likely from diverticulosis. On 05/08/2012 she received an iron effusion. FOBT +ve in 08/2010, negative in 02/2012. Unclear whether she has had colonoscopy. No h/o of bloody stools or melena. Hgb 10.2-->9.0 today.  - will monitor CBC q12h  # CKD 3: Baseline GFR of 45-56 with a creatinine of around 1.2.  Will monitor renal function in the setting of Acute CHF and diuretic therapy.   # DVT PPt: d/c Heparin given platelet; start SCD.  # Code Status: DNR/DNI    Dispo: Disposition is deferred at this time, awaiting improvement of current medical problems. Anticipated discharge in approximately 2-3 day(s).   The patient does have a current PCP Jethro Bastos, MD) and does not need an [facility] hospital follow-up appointment after discharge.  The patient does not have transportation limitations that hinder transportation to clinic appointments.  Signed:    LOS: 1 day   Lorretta Harp 09/09/2012, 3:11 PM

## 2012-09-09 NOTE — Progress Notes (Signed)
Upon assessment, pt complained of left arm pain which she described as radiating and severe with a pain score of 9/10.  Pt denied sob, diaphoresis and nausea/vomiting.  She said that while she does have chronic back pain, she said that this pain "started the same time as these attacks, I've been having." Lab had previously reported a panic troponin level of 0.98 at 2053.  An EKG was done and PRN Morphine 2mg  iv was given.  EKG was compared to previous and it was noted that there was some flattening of the T wave in leads V1-V6.  She remains SB with rates falling into the 40's, with a BBB and frequent PACs.  Dr. Burtis Junes was notified of above and plans to come to floor to assess pt.  Will continue to monitor.

## 2012-09-09 NOTE — Progress Notes (Signed)
ANTICOAGULATION CONSULT NOTE - Initial Consult  Pharmacy Consult for heparin Indication: chest pain/ACS  Allergies  Allergen Reactions  . Darvon (Propoxyphene Hcl) Nausea Only  . Paroxetine Other (See Comments)    "makes me crazy" out of body experience    Patient Measurements: Height: 5\' 2"  (157.5 cm) Weight: 163 lb 3.2 oz (74.027 kg) IBW/kg (Calculated) : 50.1 Heparin Dosing Weight: 66 kg  Vital Signs: Temp: 98.5 F (36.9 C) (06/27 2330) Temp src: Oral (06/27 2330) BP: 150/66 mmHg (06/27 2330) Pulse Rate: 98 (06/27 2330)  Labs:  Recent Labs  09/08/12 1925 09/09/12 0018  HGB 10.2*  --   HCT 30.2*  --   PLT 169  --   CREATININE 1.23*  --   TROPONINI  --  0.41*    Estimated Creatinine Clearance: 34.4 ml/min (by C-G formula based on Cr of 1.23).   Medical History: Past Medical History  Diagnosis Date  . Alzheimer's disease   . 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 Chronic kidney disease, stage III (moderate)   . Unspecified vitamin D deficiency   . Arthritis     osteo  . Hypertension   . Hyperlipidemia   . Depression   . GERD (gastroesophageal reflux disease)   . Cataract   . Exudative senile macular degeneration of retina   . Retinal neovascularization NOS   . Chronic combined systolic and diastolic heart failure   . Non-ischemic cardiomyopathy     EF 20-25% 2D 12/13  . PAF (paroxysmal atrial fibrillation)   . Pure hypercholesterolemia   . Esophageal reflux   . COPD (chronic obstructive pulmonary disease)   . Primary localized osteoarthrosis, lower leg   . Lumbago   . Vitamin D deficiency   . Macular degeneration   . Chronic pain     back  . Aortic stenosis, mild     echo 12/13  . Normal coronary arteries 10/09, 6/12  . LBBB (left bundle branch block)     Medications:  Scheduled:  . ipratropium  0.5 mg Nebulization Q6H   And  . albuterol  2.5 mg Nebulization Q6H  . aspirin EC  81 mg Oral Daily  . carvedilol  6.25 mg Oral  BID WC  . citalopram  20 mg Oral Daily  . furosemide  60 mg Intravenous BID  . insulin aspart  0-9 Units Subcutaneous Q6H  . isosorbide mononitrate  30 mg Oral Daily  . lisinopril  10 mg Oral Daily  . pantoprazole  40 mg Oral Daily  . simvastatin  20 mg Oral q1800  . sodium chloride  3 mL Intravenous Q12H  . spironolactone  25 mg Oral Daily    Assessment: 79 yo female with h/o HF (EF 20-25% in 02/2012), atrial fibrillation presented with shortness of breath. Patient was previously on warfarin for atrial fibrillation, but was discontinued due to possible GI bleed. Troponin I of 0.41. Pharmacy to manage IV heparin. Baseline Hgb 10.2 and platelets 169.   Goal of Therapy:  Heparin level 0.3-0.7 units/ml Monitor platelets by anticoagulation protocol: Yes   Plan:  1. Heparin 4000 unit IV bolus x 1, then 800 units/hr.  2. Heparin level in 8 hours.  3. Daily CBC, heparin level.   Emeline Gins 09/09/2012,2:00 AM

## 2012-09-09 NOTE — H&P (Signed)
INTERNAL MEDICINE TEACHING SERVICE Attending Admission Note  Date: 09/09/2012  Patient name: [name]  Medical record number: [id number]  Date of birth: [birth year]    I have seen and evaluated [name] and discussed their care with the Residency Team.  82 yr. Old WF w/ Chronic systolic Heart Failure (EF 20%), Afib, COPD, HTN, HL, Type 2 DM, CKD 3, dementia, presented due to SOB.  She has been feeling more SOB over the past 1 week and has been treated as a COPD exacerbation with prednisone and doxycycline. She was noted to be in respiratory distress and brought in by her son. She was noted to be cyanotic with RR 50's and bibasilar rales. She was started on BiPAP. It was reported to me the the RT at bedside that the second ABG listed in our system does not belong to her (@ 2032).  She had obvious findings on exam of volume overload and pulmonary edema.  She was admitted for acute on chronic systolic heart failure.   She was noted to have a +trop I of 0.41, repeated with similar value 4 hrs later, and now undetectable this morning. EKG with LBBB at a tachycardic rhythm likely sinus tach. ST changes not interpretable with LBBB block. LBBB is not new.  She has evidence of demand ischemia. She is improving with IV diuresis.  She is hemodynamically stable. Do not give albuterol, use xopenex for likely precipitated COPD exacerbation. Repeat ABG 7.28/48/77/22 on 0.35 FIO2 shows evidence of hypoxic/hypercarbic respiratory failure. I would not continue heparin gtt at this time. H/H has dropped since admission, repeat H/H this afternoon. No clinical evidence of bleeding, she is known to have evidence of GI bleeding in this past without definite source found. A bleed now would worsen her demand ischemia. If H/H drops, transfuse to keep Hgb above 8. Goals of care discussion with family.   Jonah Blue, [place] 6/28/20141:02 PM

## 2012-09-09 NOTE — Significant Event (Signed)
CRITICAL VALUE ALERT  Critical value received: Troponin 0.98 Date of notification:  09/09/2012  Time of notification: 2053 Critical value read back:yes  Nurse who received alert: Jeanmarie Plant RN  MD notified (1st page): Internal Med TS pager [phone]  Time of first page: 2055  Responding MD: Dr. Burtis Junes  Time MD responded:  2057

## 2012-09-09 NOTE — Progress Notes (Signed)
CRITICAL VALUE ALERT  Critical value received:  troponin 0.41  Date of notification:  09/09/12  Time of notification:  0118  Critical value read back:yes  Nurse who received alert:  Ferd Hibbs Rn  MD notified (1st page):  Zada Girt  Time of first page:  0125  MD notified (2nd page):  Time of second page:  Responding MD:  Zada Girt  Time MD responded:  0200

## 2012-09-09 NOTE — Progress Notes (Signed)
ANTICOAGULATION CONSULT NOTE - Follow Up Consult  Pharmacy Consult for heparin Indication: chest pain/ACS  Allergies  Allergen Reactions  . Darvon (Propoxyphene Hcl) Nausea Only  . Paroxetine Other (See Comments)    "makes me crazy" out of body experience   Patient Measurements: Height: 5\' 2"  (157.5 cm) Weight: 163 lb 3.2 oz (74.027 kg) IBW/kg (Calculated) : 50.1 Heparin Dosing Weight: 66 kg  Vital Signs: Temp: 98 F (36.7 C) (06/28 1133) Temp src: Axillary (06/28 1133) BP: 112/87 mmHg (06/28 1133) Pulse Rate: 64 (06/28 1155)  Labs:  Recent Labs  09/08/12 1925 09/09/12 0018 09/09/12 0217 09/09/12 0400 09/09/12 0415 09/09/12 1102  HGB 10.2*  --   --  9.0*  --   --   HCT 30.2*  --   --  26.7*  --   --   PLT 169  --   --  97*  --   --   LABPROT  --   --  14.9  --   --   --   INR  --   --  1.20  --   --   --   HEPARINUNFRC  --   --   --   --   --  <0.10*  CREATININE 1.23*  --   --  1.26*  --   --   TROPONINI  --  0.41*  --   --  0.40* <0.30   Estimated Creatinine Clearance: 33.6 ml/min (by C-G formula based on Cr of 1.26).  Medical History: Past Medical History  Diagnosis Date  . Alzheimer's disease   . 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 Chronic kidney disease, stage III (moderate)   . Unspecified vitamin D deficiency   . Arthritis     osteo  . Hypertension   . Hyperlipidemia   . Depression   . GERD (gastroesophageal reflux disease)   . Cataract   . Exudative senile macular degeneration of retina   . Retinal neovascularization NOS   . Chronic combined systolic and diastolic heart failure   . Non-ischemic cardiomyopathy     EF 20-25% 2D 12/13  . PAF (paroxysmal atrial fibrillation)   . Pure hypercholesterolemia   . Esophageal reflux   . COPD (chronic obstructive pulmonary disease)   . Primary localized osteoarthrosis, lower leg   . Lumbago   . Vitamin D deficiency   . Macular degeneration   . Chronic pain     back  . Aortic  stenosis, mild     echo 12/13  . Normal coronary arteries 10/09, 6/12  . LBBB (left bundle branch block)    Medications:  Scheduled:  . aspirin EC  81 mg Oral Daily  . carvedilol  6.25 mg Oral BID WC  . Chlorhexidine Gluconate Cloth  6 each Topical Q0600  . citalopram  20 mg Oral Daily  . furosemide  60 mg Intravenous BID  . insulin aspart  0-9 Units Subcutaneous Q6H  . ipratropium  0.5 mg Nebulization Q6H  . isosorbide mononitrate  30 mg Oral Daily  . levalbuterol  0.63 mg Nebulization Q6H  . lisinopril  10 mg Oral Daily  . mupirocin ointment  1 application Nasal BID  . pantoprazole  40 mg Oral Daily  . simvastatin  20 mg Oral q1800  . sodium chloride  3 mL Intravenous Q12H  . spironolactone  25 mg Oral Daily   Assessment: 79 yo female with h/o HF (EF 20-25% in 02/2012), atrial fibrillation presented with shortness of breath.  Patient was previously on warfarin for atrial fibrillation, but was discontinued due to possible GI bleed. Troponin is now < 0.3.  Initial heparin level is < 0.1 on IV heparin rate of 800 units/hr.  Has hx. of thrombocytopenia and platelets down this morning to 97K but no noted bleeding.  Goal of Therapy:  Heparin level 0.3-0.7 units/ml Monitor platelets by anticoagulation protocol: Yes   Plan:  1. Will increase IV Heparin to 950 units/hr.  2. Heparin level in 8 hours.  3. Daily CBC, heparin level.   Nadara Mustard, PharmD., MS Clinical Pharmacist Pager:  [phone] Thank you for allowing pharmacy to be part of this patients care team. 09/09/2012,12:59 PM

## 2012-09-09 NOTE — Progress Notes (Signed)
Pt Alert X 3. Content to wear bipap with only one incident anxiety in early AM. Some incontinence during shift making it difficult to have exact I & O. Will continue to offer bedpan every 2 hours.

## 2012-09-09 NOTE — Progress Notes (Signed)
Central Monitoring tech called to report that pt experienced a 2.54 second pause with PVC and a nonsustained heart rate of 39.  Pt is easily arousable with no change in mentation.  She has no complaints of pain at this time.  She says she is resting comfortably. Pt continues to wear the BIPAP for respiratory support and is tolerating that well. Will continue to monitor.

## 2012-09-10 DIAGNOSIS — F0280 Dementia in other diseases classified elsewhere without behavioral disturbance: Secondary | ICD-10-CM

## 2012-09-10 DIAGNOSIS — Z8679 Personal history of other diseases of the circulatory system: Secondary | ICD-10-CM

## 2012-09-10 DIAGNOSIS — E119 Type 2 diabetes mellitus without complications: Secondary | ICD-10-CM

## 2012-09-10 DIAGNOSIS — R7989 Other specified abnormal findings of blood chemistry: Secondary | ICD-10-CM

## 2012-09-10 DIAGNOSIS — I10 Essential (primary) hypertension: Secondary | ICD-10-CM

## 2012-09-10 LAB — CBC
MCV: 95.9 fL (ref 78.0–100.0)
MCV: 95.8 fL (ref 78.0–100.0)
Platelets: 85 10*3/uL — ABNORMAL LOW (ref 150–400)
Platelets: 72 10*3/uL — ABNORMAL LOW (ref 150–400)
RBC: 2.68 MIL/uL — ABNORMAL LOW (ref 3.87–5.11)
RBC: 2.59 MIL/uL — ABNORMAL LOW (ref 3.87–5.11)
RDW: 14 % (ref 11.5–15.5)
RDW: 14 % (ref 11.5–15.5)
WBC: 6.8 10*3/uL (ref 4.0–10.5)
WBC: 4.9 10*3/uL (ref 4.0–10.5)

## 2012-09-10 LAB — TROPONIN I: Troponin I: 0.89 ng/mL (ref <0.30)

## 2012-09-10 LAB — GLUCOSE, CAPILLARY
Glucose-Capillary: 106 mg/dL — ABNORMAL HIGH (ref 70–99)
Glucose-Capillary: 121 mg/dL — ABNORMAL HIGH (ref 70–99)
Glucose-Capillary: 95 mg/dL (ref 70–99)
Glucose-Capillary: 97 mg/dL (ref 70–99)

## 2012-09-10 LAB — BASIC METABOLIC PANEL
CO2: 25 mEq/L (ref 19–32)
Chloride: 101 mEq/L (ref 96–112)
Creatinine, Ser: 1.33 mg/dL — ABNORMAL HIGH (ref 0.50–1.10)
GFR calc Af Amer: 43 mL/min — ABNORMAL LOW (ref >90)
Potassium: 3.6 mEq/L (ref 3.5–5.1)
Sodium: 136 mEq/L (ref 135–145)

## 2012-09-10 LAB — HEPARIN LEVEL (UNFRACTIONATED): Heparin Unfractionated: 0.28 IU/mL — ABNORMAL LOW (ref 0.30–0.70)

## 2012-09-10 LAB — MAGNESIUM: Magnesium: 1.8 mg/dL (ref 1.5–2.5)

## 2012-09-10 MED ORDER — HEPARIN (PORCINE) IN NACL 100-0.45 UNIT/ML-% IJ SOLN
1000.0000 [IU]/h | INTRAMUSCULAR | Status: DC
  Administered 2012-09-10: 950 [IU]/h via INTRAVENOUS
  Administered 2012-09-10: 1000 [IU]/h via INTRAVENOUS
  Filled 2012-09-10 (×2): qty 250

## 2012-09-10 MED ORDER — BIOTENE DRY MOUTH MT LIQD
15.0000 mL | Freq: Two times a day (BID) | OROMUCOSAL | Status: DC
  Administered 2012-09-10 – 2012-09-12 (×5): 15 mL via OROMUCOSAL

## 2012-09-10 MED ORDER — CHLORHEXIDINE GLUCONATE 0.12 % MT SOLN
15.0000 mL | Freq: Two times a day (BID) | OROMUCOSAL | Status: DC
  Administered 2012-09-10 – 2012-09-13 (×8): 15 mL via OROMUCOSAL
  Filled 2012-09-10 (×9): qty 15

## 2012-09-10 MED ORDER — CLOPIDOGREL BISULFATE 75 MG PO TABS
75.0000 mg | ORAL_TABLET | Freq: Every day | ORAL | Status: DC
  Administered 2012-09-10 – 2012-09-13 (×4): 75 mg via ORAL
  Filled 2012-09-10 (×5): qty 1

## 2012-09-10 MED ORDER — BIOTENE DRY MOUTH MT LIQD
15.0000 mL | Freq: Two times a day (BID) | OROMUCOSAL | Status: DC
  Administered 2012-09-10 – 2012-09-13 (×6): 15 mL via OROMUCOSAL

## 2012-09-10 MED ORDER — CLOPIDOGREL BISULFATE 75 MG PO TABS
75.0000 mg | ORAL_TABLET | Freq: Every day | ORAL | Status: DC
  Filled 2012-09-10 (×2): qty 1

## 2012-09-10 NOTE — Progress Notes (Signed)
ANTICOAGULATION CONSULT NOTE - Follow Up Consult  Pharmacy Consult for heparin Indication: chest pain/ACS  Allergies  Allergen Reactions  . Darvon (Propoxyphene Hcl) Nausea Only  . Paroxetine Other (See Comments)    "makes me crazy" out of body experience   Patient Measurements: Height: 5\' 2"  (157.5 cm) Weight: 160 lb 15 oz (73 kg) IBW/kg (Calculated) : 50.1 Heparin Dosing Weight: 66 kg  Vital Signs: Temp: 96.7 F (35.9 C) (06/29 0800) Temp src: Axillary (06/29 0800) BP: 115/71 mmHg (06/29 0800) Pulse Rate: 58 (06/29 0800)  Labs:  Recent Labs  09/08/12 1925  09/09/12 0217 09/09/12 0400  09/09/12 1102 09/09/12 1700 09/09/12 1800 09/10/12 0330 09/10/12 0830 09/10/12 1133  HGB 10.2*  --   --  9.0*  --   --  8.8*  --  8.8*  --   --   HCT 30.2*  --   --  26.7*  --   --  26.2*  --  25.7*  --   --   PLT 169  --   --  97*  --   --  86*  --  85*  --   --   LABPROT  --   --  14.9  --   --   --   --   --   --   --   --   INR  --   --  1.20  --   --   --   --   --   --   --   --   HEPARINUNFRC  --   --   --   --   --  <0.10*  --   --   --   --  0.28*  CREATININE 1.23*  --   --  1.26*  --   --   --   --  1.33*  --   --   TROPONINI  --   < >  --   --   < > <0.30  --  0.98* 1.07* 1.15*  --   < > = values in this interval not displayed. Estimated Creatinine Clearance: 31.6 ml/min (by C-G formula based on Cr of 1.33).  Medical History: Past Medical History  Diagnosis Date  . Alzheimer's disease   . 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 Chronic kidney disease, stage III (moderate)   . Unspecified vitamin D deficiency   . Arthritis     osteo  . Hypertension   . Hyperlipidemia   . Depression   . GERD (gastroesophageal reflux disease)   . Cataract   . Exudative senile macular degeneration of retina   . Retinal neovascularization NOS   . Chronic combined systolic and diastolic heart failure   . Non-ischemic cardiomyopathy     EF 20-25% 2D 12/13  . PAF  (paroxysmal atrial fibrillation)   . Pure hypercholesterolemia   . Esophageal reflux   . COPD (chronic obstructive pulmonary disease)   . Primary localized osteoarthrosis, lower leg   . Lumbago   . Vitamin D deficiency   . Macular degeneration   . Chronic pain     back  . Aortic stenosis, mild     echo 12/13  . Normal coronary arteries 10/09, 6/12  . LBBB (left bundle branch block)    Medications:  Scheduled:  . antiseptic oral rinse  15 mL Mouth Rinse q12n4p  . antiseptic oral rinse  15 mL Mouth Rinse BID  . aspirin EC  81 mg Oral  Daily  . carvedilol  6.25 mg Oral BID WC  . chlorhexidine  15 mL Mouth Rinse BID  . Chlorhexidine Gluconate Cloth  6 each Topical Q0600  . citalopram  20 mg Oral Daily  . insulin aspart  0-9 Units Subcutaneous Q6H  . ipratropium  0.5 mg Nebulization Q6H  . isosorbide mononitrate  30 mg Oral Daily  . levalbuterol  0.63 mg Nebulization Q6H  . lisinopril  10 mg Oral Daily  . mupirocin ointment  1 application Nasal BID  . pantoprazole  40 mg Oral Daily  . simvastatin  20 mg Oral q1800  . sodium chloride  3 mL Intravenous Q12H  . spironolactone  25 mg Oral Daily   Assessment: 79 yo female with h/o HF (EF 20-25% in 02/2012), atrial fibrillation presented with shortness of breath. Patient was previously on warfarin for atrial fibrillation, but was discontinued due to possible GI bleed. Troponin is now < 0.3.  Heparin level is now 0.28 and just below desired goal range on IV heparin rate of 950 units/hr.  Has hx. of thrombocytopenia and platelets are 85K but no noted bleeding.  She does not want invasive cardiac intervention.  Goal of Therapy:  Heparin level 0.3-0.7 units/ml Monitor platelets by anticoagulation protocol: Yes   Plan:  1. Will increase IV Heparin to 1000 units/hr.  2. Heparin level in AM.  3. Daily CBC, heparin level.   Nadara Mustard, PharmD., MS Clinical Pharmacist Pager:  [phone] Thank you for allowing pharmacy to be part of  this patients care team. 09/10/2012,12:54 PM

## 2012-09-10 NOTE — Progress Notes (Signed)
Pt Sats >92% on 3L n/c Alert X 4. No apparent anxiety. Pain med given twice in 12 hours for right shoulder pain.

## 2012-09-10 NOTE — Significant Event (Signed)
CRITICAL VALUE ALERT  Critical value received: Troponin 1.07  Date of notification:  07/11/2012  Time of notification: noticed in epic results at [id number]  Critical value read back: this value not yet called by lab, noted value in echart and notified MD   Nurse who received alert: Jeanmarie Plant RN  MD notified (1st page): Dr. Burtis Junes  Time of first page: 0413  Responding MD: Dr. Burtis Junes  Time MD responded: [phone]

## 2012-09-10 NOTE — Progress Notes (Addendum)
Subjective: Pt denies any SOB or chest pain. Endorses persistent severe L upper arm pain. Troponins trending upward overnight to 1.07; heparin drip was continued. Hbg stable this am at 8.8. BP 130/41 this am.   Objective: Vital signs in last 24 hours: Filed Vitals:   09/10/12 0359 09/10/12 0500 09/10/12 0800 09/10/12 0828  BP:  130/41 115/71   Pulse: 55 51 58   Temp:  96.7 F (35.9 C) 96.7 F (35.9 C)   TempSrc:  Axillary Axillary   Resp: 13 14 13    Height:      Weight:  160 lb 15 oz (73 kg)    SpO2: 100% 100% 98% 100%   Weight change: -12 lb 3 oz (-5.529 kg)  Intake/Output Summary (Last 24 hours) at 09/10/12 0859 Last data filed at 09/10/12 0700  Gross per 24 hour  Intake    195 ml  Output   1025 ml  Net   -830 ml    Physical Exam:   Filed Vitals:   09/10/12 0359 09/10/12 0500 09/10/12 0800 09/10/12 0828  BP:  130/41 115/71   Pulse: 55 51 58   Temp:  96.7 F (35.9 C) 96.7 F (35.9 C)   TempSrc:  Axillary Axillary   Resp: 13 14 13    Height:      Weight:  160 lb 15 oz (73 kg)    SpO2: 100% 100% 98% 100%    General: Not in acute distress HEENT: PERRL, EOMI, no scleral icterus, No JVD or bruit Cardiac: S1/S2, RRR, No murmurs, gallops or rubs Pulm: Bipap in place. No increased work of breathing  Heart: Reg rate, regular rhythm at this time;  no murmurs. JVD distended.  Abd: Soft, nondistended, nontender, no rebound pain, no organomegaly, BS present Ext: 1+ pitting edema. 2+DP/PT pulse bilaterally Musculoskeletal: No joint deformities, erythema, or stiffness, ROM full Skin: No rashes.  Neuro: Alert and oriented X3, cranial nerves II-XII grossly intact, muscle strength 5/5 in all extremeties, sensation to light touch intact. Brachial reflex 2+ bilaterally. Knee reflex 1+ bilaterally. Negative Babinski's sign. Normal finger to nose test. Psych: Appear to have pain in LUE. Patient is not psychotic, no suicidal or hemocidal ideation.  Lab Results: Basic Metabolic  Panel:  Recent Labs Lab 09/09/12 0018 09/09/12 0400 09/10/12 0330  NA  --  137 136  K  --  4.5 3.6  CL  --  101 101  CO2  --  25 25  GLUCOSE  --  168* 110*  BUN  --  32* 40*  CREATININE  --  1.26* 1.33*  CALCIUM  --  9.0 8.7  MG 1.7  --  1.8  PHOS 4.2  --   --    Liver Function Tests:  Recent Labs Lab 09/08/12 1925  AST 29  ALT 17  ALKPHOS 89  BILITOT 1.0  PROT 6.5  ALBUMIN 3.7   CBC:  Recent Labs Lab 09/08/12 1925  09/09/12 1700 09/10/12 0330  WBC 6.6  < > 7.4 6.8  NEUTROABS 6.0  --   --   --   HGB 10.2*  < > 8.8* 8.8*  HCT 30.2*  < > 26.2* 25.7*  MCV 96.5  < > 96.7 95.9  PLT 169  < > 86* 85*  < > = values in this interval not displayed. Cardiac Enzymes:  Recent Labs Lab 09/09/12 1102 09/09/12 1800 09/10/12 0330  TROPONINI <0.30 0.98* 1.07*   BNP:  Recent Labs Lab 09/08/12 2329  PROBNP 8855.0*  CBG:  Recent Labs Lab 09/09/12 0636 09/09/12 0811 09/09/12 1808 09/10/12 0040 09/10/12 0503 09/10/12 0724  GLUCAP 141* 131* 118* 121* 106* 97   Coagulation:  Recent Labs Lab 09/09/12 0217  LABPROT 14.9  INR 1.20    Micro Results: Recent Results (from the past 240 hour(s))  MRSA PCR SCREENING     Status: Abnormal   Collection Time    09/09/12 12:18 AM      Result Value Range Status   MRSA by PCR POSITIVE (*) NEGATIVE Final   Comment:            The GeneXpert MRSA Assay (FDA     approved for NASAL specimens     only), is one component of a     comprehensive MRSA colonization     surveillance program. It is not     intended to diagnose MRSA     infection nor to guide or     monitor treatment for     MRSA infections.     RESULT CALLED TO, READ BACK BY AND VERIFIED WITH:     CALLED TO RN [facility] [id number] @0018    Studies/Results: Dg Chest Port 1 View  09/08/2012   *RADIOLOGY REPORT*  Clinical Data: Severe shortness of breath  PORTABLE CHEST - 1 VIEW  Comparison: 03/03/2012; 02/29/2012  Findings: Grossly unchanged enlarged  cardiac silhouette and mediastinal contours gave an decreased lung volumes.  The pulmonary vascular sure is indistinct with cephalization of flow.  Interval development of at least a small sized bilateral pleural effusions with worsening bibasilar heterogeneous / consolidative opacities, right greater than left.  No definite pneumothorax.  Chronic deformity of the right glenoid.  IMPRESSION: Findings compatible with pulmonary edema with at least small sized bilateral effusions and associated bibasilar opacities, atelectasis versus infiltrate.   Original Report Authenticated By: Tacey Ruiz, MD   Medications:  Scheduled Meds: . antiseptic oral rinse  15 mL Mouth Rinse q12n4p  . antiseptic oral rinse  15 mL Mouth Rinse BID  . aspirin EC  81 mg Oral Daily  . carvedilol  6.25 mg Oral BID WC  . chlorhexidine  15 mL Mouth Rinse BID  . Chlorhexidine Gluconate Cloth  6 each Topical Q0600  . citalopram  20 mg Oral Daily  . insulin aspart  0-9 Units Subcutaneous Q6H  . ipratropium  0.5 mg Nebulization Q6H  . isosorbide mononitrate  30 mg Oral Daily  . levalbuterol  0.63 mg Nebulization Q6H  . lisinopril  10 mg Oral Daily  . mupirocin ointment  1 application Nasal BID  . pantoprazole  40 mg Oral Daily  . simvastatin  20 mg Oral q1800  . sodium chloride  3 mL Intravenous Q12H  . spironolactone  25 mg Oral Daily   Continuous Infusions: . heparin 950 Units/hr (09/10/12 0730)   PRN Meds:.morphine injection, nitroGLYCERIN Assessment/Plan:  # NSTEMI:  iStat troponin negative in the ED. Repeat troponin 0.41>>0.40>>0.30; thought likely related to acute CHF causing demand cardia ischemia. TIMI score 3 (13% 14 day risk) on admission. Since admission, troponins trending upward to 1.15 this morning. No EKG changes. FOBT negative, Hgb stable at 8.8. She is on bipap saturating 100%. Not sure if this is demand ischemia with her low DBP into the 40-50s; holding Lasix. Per pt and family,  Pt  is DNR and does not  want invasive cardiac intervention. She denies chest pain. Cardiology was consulted and b/c the pt does not want invasive intervention, Dr. Graciela Husbands  recommended adding Plavix to current treatment.    Recent Labs Lab 09/09/12 0415 09/09/12 1102 09/09/12 1800 09/10/12 0330 09/10/12 0830  TROPONINI 0.40* <0.30 0.98* 1.07* 1.15*   - Continue heparin gtt - Adding Plavix - Trend troponin - Monitor for chest pain  - Consult Cardiology - Hold Lasix  # Acute on Chronic Systolic HF: EF 20-25% on echo of 02/28/2012. Likely trigger is atrial fibrillation or ACS however, features of cardiac ischemia on EKG and iStat trop is negative. PE unlikely with Well's score of 1.5 points (1.3% pretest proba). Family reports compliance with medications and not dietary indescritions. Her weight is up by 16Lb (157>>173) from her previous admission in 02/2012. JVD is elevated and has impressive bibasilar rales but current Pro BNP 8855<<13270 (03/03/2012). CXR reveals pulmonary edema with effusions. Initally she became more dyspneic when BiPAP was temporarily removed. Even though the patient was being treated as COPD exacerbation, clinical presentation does not appear to be consistent with this. No increased cough, sputum or wheezing. Patient felt better with diuresis. Cr increasing and with low DBP, holding Lasix.  - Morphine 2 mg q4 prn for chest pain  - Holding Lasix   - Strict in/out, daily weight , fluid restriction - Coreg 6.25 mg twice daily. - Imdur 30 mg daily. - Lisinopril 10 mg daily. - Spironolactone  25 mg daily - Family and patient decided against ICD or Life Vest; pt is DNR/DNI - monitor renal function   - Xopenex PRN.  # A.Fibrillation: This appears to be intermittent. Her CHAD2 score is 4. She has been initiated on coumadin but family decided not to continue with it after a discussion with PCP about risks and benefits of anticoagulation in the setting in probable GI bleeding even though endoscope has  been negative. EKG currently no active A. Fib on EKG. She is on coreg at home, which was continued on admission. No anticoagulation 2/2 recent GI bleed.  Plan   - Continue with Coreg   - Continue telemetry  # HTN: BP current stable. Will continue with medication, as noted above under CHF.   # DM: She does not take hypoglycemics. Had 40 mg of oral prednisone on the day of admission. CBG 269 on admission, better controlled today.   - SSI-Sensitive  # Normocytic Anemia:  Baseline hemoglobin of around 10. As her ECP note, anemia is presumed to be from GI bleeding, likely from diverticulosis. On 05/08/2012 she received an iron effusion. FOBT + 08/2010, negative in 02/2012. Unclear whether she has had colonoscopy. No h/o of bloody stools or melena. Hgb 10.2 on admission -->8.8 today.  Recent Labs Lab 09/08/12 1925 09/09/12 0400 09/09/12 1700 09/10/12 0330  HGB 10.2* 9.0* 8.8* 8.8*   - AM CBC   # CKD 3: Baseline GFR of 45-56 with a creatinine of around 1.2.  Worse this admission with GFR of 37 and Cr 1.33 today. Likely 2/2 acute CHF and diuretic therapy. Holding Lasix for now, esp in the setting of low DBP.    Recent Labs Lab 09/08/12 1925 09/09/12 0400 09/10/12 0330  CREATININE 1.23* 1.26* 1.33*    # DVT PPx: On heparin drip; SCDs.  # Code Status: DNR/DNI    Dispo: Disposition is deferred at this time, awaiting improvement of current medical problems. Anticipated discharge in approximately 2-3 day(s).   The patient does have a current PCP Jethro Bastos, MD) and does not need an [facility] hospital follow-up appointment after discharge.  The patient does not have  transportation limitations that hinder transportation to clinic appointments.  Signed:    LOS: 2 days   Genelle Gather 09/10/2012, 8:59 AM

## 2012-09-11 DIAGNOSIS — I214 Non-ST elevation (NSTEMI) myocardial infarction: Secondary | ICD-10-CM

## 2012-09-11 LAB — GLUCOSE, CAPILLARY
Glucose-Capillary: 86 mg/dL (ref 70–99)
Glucose-Capillary: 200 mg/dL — ABNORMAL HIGH (ref 70–99)
Glucose-Capillary: 109 mg/dL — ABNORMAL HIGH (ref 70–99)
Glucose-Capillary: 205 mg/dL — ABNORMAL HIGH (ref 70–99)

## 2012-09-11 LAB — TROPONIN I: Troponin I: 0.68 ng/mL (ref <0.30)

## 2012-09-11 LAB — BASIC METABOLIC PANEL
Calcium: 8.5 mg/dL (ref 8.4–10.5)
GFR calc Af Amer: 43 mL/min — ABNORMAL LOW (ref >90)
GFR calc non Af Amer: 37 mL/min — ABNORMAL LOW (ref >90)
Potassium: 3.8 mEq/L (ref 3.5–5.1)
Sodium: 135 mEq/L (ref 135–145)

## 2012-09-11 LAB — CBC
MCH: 33.6 pg (ref 26.0–34.0)
MCHC: 34.6 g/dL (ref 30.0–36.0)
RDW: 14.1 % (ref 11.5–15.5)

## 2012-09-11 MED ORDER — INSULIN ASPART 100 UNIT/ML ~~LOC~~ SOLN
0.0000 [IU] | Freq: Three times a day (TID) | SUBCUTANEOUS | Status: DC
  Administered 2012-09-12 (×2): 1 [IU] via SUBCUTANEOUS
  Administered 2012-09-13: 2 [IU] via SUBCUTANEOUS

## 2012-09-11 MED ORDER — FUROSEMIDE 40 MG PO TABS
40.0000 mg | ORAL_TABLET | Freq: Two times a day (BID) | ORAL | Status: DC
  Administered 2012-09-11 – 2012-09-13 (×5): 40 mg via ORAL
  Filled 2012-09-11 (×7): qty 1

## 2012-09-11 NOTE — Progress Notes (Signed)
Subjective: Pt denies any SOB or chest pain. Endorses L upper arm pain that has improved since admission. Troponins trending downward to 0.68 this morning; heparin drip was d/c'd. Hbg stable this am at 8.4. BP stable. She is requesting to eat.   Objective: Vital signs in last 24 hours: Filed Vitals:   09/11/12 0045 09/11/12 0147 09/11/12 0400 09/11/12 0756  BP: 132/48  142/66 140/61  Pulse: 67  62 90  Temp: 97.9 F (36.6 C)  98.1 F (36.7 C) 98.2 F (36.8 C)  TempSrc: Oral  Oral Oral  Resp: 16  14 16   Height:      Weight:      SpO2: 100% 100% 100% 100%   Weight change:   Intake/Output Summary (Last 24 hours) at 09/11/12 1057 Last data filed at 09/11/12 0800  Gross per 24 hour  Intake  182.5 ml  Output    800 ml  Net -617.5 ml    Physical Exam:   Filed Vitals:   09/11/12 0045 09/11/12 0147 09/11/12 0400 09/11/12 0756  BP: 132/48  142/66 140/61  Pulse: 67  62 90  Temp: 97.9 F (36.6 C)  98.1 F (36.7 C) 98.2 F (36.8 C)  TempSrc: Oral  Oral Oral  Resp: 16  14 16   Height:      Weight:      SpO2: 100% 100% 100% 100%    General: Not in acute distress HEENT: PERRL, EOMI, no scleral icterus, No JVD or bruit Cardiac: S1/S2, RRR, No murmurs, gallops or rubs. JVD distended. Pulm: Bipap in place. No increased work of breathing   Abd: Soft, nondistended, nontender, no rebound pain, no organomegaly, BS present Ext:  No edema noted this morning. 2+DP/PT pulse bilaterally Musculoskeletal: No joint deformities, erythema, or stiffness, ROM full Skin: No rashes.  Neuro: Alert and oriented X3, cranial nerves II-XII grossly intact, muscle strength 5/5 in all extremeties, sensation to light touch intact. Normal finger to nose test. Psych: Normal mood and affect. Patient is not psychotic, no suicidal or hemocidal ideation.   Lab Results: Basic Metabolic Panel:  Recent Labs Lab 09/09/12 0018  09/10/12 0330 09/11/12 0305  NA  --   < > 136 135  K  --   < > 3.6 3.8  CL  --    < > 101 100  CO2  --   < > 25 25  GLUCOSE  --   < > 110* 136*  BUN  --   < > 40* 39*  CREATININE  --   < > 1.33* 1.33*  CALCIUM  --   < > 8.7 8.5  MG 1.7  --  1.8  --   PHOS 4.2  --   --   --   < > = values in this interval not displayed. Liver Function Tests:  Recent Labs Lab 09/08/12 1925  AST 29  ALT 17  ALKPHOS 89  BILITOT 1.0  PROT 6.5  ALBUMIN 3.7   CBC:  Recent Labs Lab 09/08/12 1925  09/10/12 1631 09/11/12 0305  WBC 6.6  < > 4.9 4.7  NEUTROABS 6.0  --   --   --   HGB 10.2*  < > 8.6* 8.4*  HCT 30.2*  < > 24.8* 24.3*  MCV 96.5  < > 95.8 97.2  PLT 169  < > 72* 74*  < > = values in this interval not displayed. Cardiac Enzymes:  Recent Labs Lab 09/10/12 1440 09/10/12 2107 09/11/12 0304  TROPONINI 0.89* 0.90*  0.68*   BNP:  Recent Labs Lab 09/08/12 2329  PROBNP 8855.0*   CBG:  Recent Labs Lab 09/10/12 0503 09/10/12 0724 09/10/12 1236 09/10/12 1725 09/11/12 0042 09/11/12 0645  GLUCAP 106* 97 95 94 86 108*   Coagulation:  Recent Labs Lab 09/09/12 0217  LABPROT 14.9  INR 1.20    Micro Results: Recent Results (from the past 240 hour(s))  MRSA PCR SCREENING     Status: Abnormal   Collection Time    09/09/12 12:18 AM      Result Value Range Status   MRSA by PCR POSITIVE (*) NEGATIVE Final   Comment:            The GeneXpert MRSA Assay (FDA     approved for NASAL specimens     only), is one component of a     comprehensive MRSA colonization     surveillance program. It is not     intended to diagnose MRSA     infection nor to guide or     monitor treatment for     MRSA infections.     RESULT CALLED TO, READ BACK BY AND VERIFIED WITH:     CALLED TO RN Voncille Lo [id number] @0018    Studies/Results: No results found. Medications:  Scheduled Meds: . antiseptic oral rinse  15 mL Mouth Rinse q12n4p  . antiseptic oral rinse  15 mL Mouth Rinse BID  . aspirin EC  81 mg Oral Daily  . carvedilol  6.25 mg Oral BID WC  . chlorhexidine   15 mL Mouth Rinse BID  . Chlorhexidine Gluconate Cloth  6 each Topical Q0600  . citalopram  20 mg Oral Daily  . clopidogrel  75 mg Oral QPC breakfast  . furosemide  40 mg Oral BID  . insulin aspart  0-9 Units Subcutaneous Q6H  . ipratropium  0.5 mg Nebulization Q6H  . isosorbide mononitrate  30 mg Oral Daily  . levalbuterol  0.63 mg Nebulization Q6H  . lisinopril  10 mg Oral Daily  . mupirocin ointment  1 application Nasal BID  . pantoprazole  40 mg Oral Daily  . simvastatin  20 mg Oral q1800  . sodium chloride  3 mL Intravenous Q12H  . spironolactone  25 mg Oral Daily   Continuous Infusions:   PRN Meds:.morphine injection, nitroGLYCERIN Assessment/Plan:  # NSTEMI:  iStat troponin negative in the ED. Repeat troponin 0.41>>0.40>>0.30; thought likely related to acute CHF causing demand cardia ischemia. TIMI score 3 (13% 14 day risk) on admission. Since admission, troponins trending upward to 1.15 this morning. No EKG changes. FOBT negative, Hgb stable at 8.8. She is on bipap saturating 100%. Not sure if this is demand ischemia with her low DBP into the 40-50s; held Lasix on 6/29. Per pt and family,  Pt  is DNR and does not want invasive cardiac intervention. She denies chest pain. Cardiology was consulted and b/c the pt does not want invasive intervention, Dr. Graciela Husbands recommended adding Plavix to current treatment, which was stared 6/30. Her troponins began to trend downward over night. She denies any chest pain or SOB, and the heparin drip was stopped. BP also improved so restarting home Lasix dose.  Recent Labs Lab 09/10/12 0330 09/10/12 0830 09/10/12 1440 09/10/12 2107 09/11/12 0304  TROPONINI 1.07* 1.15* 0.89* 0.90* 0.68*   - Continue Plavix - Trend troponins - Restarting home Lasix 40mg  po daily  # Acute on Chronic Systolic HF: EF 20-25% on echo of 02/28/2012. Likely trigger is  atrial fibrillation or ACS however, features of cardiac ischemia on EKG and iStat trop is negative. PE  unlikely with Well's score of 1.5 points (1.3% pretest proba). Family reports compliance with medications and not dietary indescritions. Her weight was up by 16Lb (157>>173) from her previous admission in 02/2012. JVD is elevated and has impressive bibasilar rales but current Pro BNP 8855<<13270 (03/03/2012). CXR reveals pulmonary edema with effusions. Initally she became more dyspneic when BiPAP was temporarily removed. No evidence for COPD exacerbation: no increased cough, sputum or wheezing & patient felt better with diuresis. Cr was increasing and with low DBP, held Lasix on 6/29. Today kidney fx is stable and BP is improved, so restarting home Lasix dose to continue diuresis.  - Morphine 2 mg q4 prn for chest pain  - Restarting Lasix 40mg  po daily - Strict I&Os, daily weight , fluid restriction - Coreg 6.25 mg twice daily. - Imdur 30 mg daily. - Lisinopril 10 mg daily. - Spironolactone  25 mg daily - Xopenex PRN.  # A.Fibrillation: This appears to be intermittent. Her CHAD2 score is 4. She has been initiated on coumadin but family decided not to continue with it after a discussion with PCP about risks and benefits of anticoagulation in the setting in probable GI bleeding even though endoscope has been negative. EKG w/o active A. Fib. She is on coreg at home, which was continued on admission. Not previously on anticoagulation 2/2 recent GI bleed, but on ASA and Plavix now 2/2 NSTEMI. - Continue with Coreg  - ASA  - Continue telemetry  # HTN: BP current stable. Pt is on a BB, ACEi, diuretics, and nitrate at home, which were continued on admission.   # DM: She does not take hypoglycemics. Had 40 mg of oral prednisone on the day of admission. CBG 269 on admission, better controlled since.   - SSI-Sensitive  # Normocytic Anemia:  Baseline hemoglobin of around 10. As her ECP note, anemia is presumed to be from GI bleeding, likely from diverticulosis. On 05/08/2012 she received an iron effusion.  FOBT + 08/2010, negative in 02/2012. Unclear whether she has had colonoscopy. No h/o of bloody stools or melena. Hgb 10.2 on admission -->8.4 today.  Recent Labs Lab 09/09/12 0400 09/09/12 1700 09/10/12 0330 09/10/12 1631 09/11/12 0305  HGB 9.0* 8.8* 8.8* 8.6* 8.4*   - AM CBC   # CKD 3: Baseline GFR of 45-56 with a creatinine of around 1.2.  Worse this admission with GFR of 37 and Cr 1.33 today, but stable. Likely 2/2 acute CHF and diuretic therapy. Held Lasix yesterday, in the setting of low DBP.    Recent Labs Lab 09/08/12 1925 09/09/12 0400 09/10/12 0330 09/11/12 0305  CREATININE 1.23* 1.26* 1.33* 1.33*    # DVT PPx: ASA & Plavix; SCDs.  # Code Status: DNR/DNI    Dispo: Disposition is deferred at this time, awaiting improvement of current medical problems. Anticipated discharge in approximately 1-2 day(s).   The patient does have a current PCP Jethro Bastos, MD) and does not need an [facility] hospital follow-up appointment after discharge.  The patient does not have transportation limitations that hinder transportation to clinic appointments.  Signed:    LOS: 3 days   Genelle Gather 09/11/2012, 10:57 AM

## 2012-09-11 NOTE — Progress Notes (Signed)
Subjective: I saw Ms [name] today as her PCP to become familiar with her status and talk with physicians and patient about after hospital care.  Pt is almost back to her baseline breathing.  She believes her dry weight is 155lbs.  She says she rarely has peripheral edema but her abdomen enlarges when she in CHF. She denies any GI blood loss despite drop in Hemoglobin.  Her anemia has been difficult to manage.  Her iron stores drop without obvious blood loss but she does not have a significant response to iron replacement.  EPO levels are normal.  SPEP has been negative  I suspect she has primary marrow failure but pt does not want a bone marrow biopsy.  Objective: Vital signs in last 24 hours: Temp:  [97 F (36.1 C)-98.2 F (36.8 C)] 98.2 F (36.8 C) (06/30 0756) Pulse Rate:  [59-90] 90 (06/30 0756) Resp:  [14-18] 16 (06/30 0756) BP: (132-151)/(48-66) 140/61 mmHg (06/30 0756) SpO2:  [100 %] 100 % (06/30 0756) Weight change:  Last BM Date: 09/08/12  Intake/Output from previous day: 06/29 0701 - 06/30 0700 In: 201 [I.V.:201] Out: 800 [Urine:800] Intake/Output this shift: Total I/O In: 10 [I.V.:10] Out: -   General appearance: alert, cooperative and no distress Resp: diminished breath sounds RLL and rales bibasilar Cardio: regular rate and rhythm, S1, S2 normal, no murmur, click, rub or gallop Extremities: no edema, redness or tenderness in the calves or thighs Skin: Skin color, texture, turgor normal. No rashes or lesions or ecchymoses - hip(s) left Rectal: empty vault. No masses. Small amount of stool sent for hemoccult.  Lab Results:  Recent Labs  09/10/12 1631 09/11/12 0305  WBC 4.9 4.7  HGB 8.6* 8.4*  HCT 24.8* 24.3*  PLT 72* 74*   BMET  Recent Labs  09/10/12 0330 09/11/12 0305  NA 136 135  K 3.6 3.8  CL 101 100  CO2 25 25  GLUCOSE 110* 136*  BUN 40* 39*  CREATININE 1.33* 1.33*  CALCIUM 8.7 8.5    Studies/Results: No results found.  Medications: I have  reviewed the patient's current medications.  Assessment/Plan: 1.  Heart Failure: Its is difficult to  Distinguish between COPD and CHF in this woman who does not develop peripheral edema. Will use dry weight of 155lbs until we determine otherwise.  I will have lower threshold for CXR to assess her SOB.   2. Anemia: As stated above suspect primary marrow dysfunction.  Will check stool for occult blood. Specimen sent to lab.  No other source for blood loss as ecchymoses on hip too small to account for drop in HG.  Follow-up appt on Thursday at the [facility]   LOS: 3 days   Jahaziel Francois TORRES 09/11/2012, 10:23 AM

## 2012-09-11 NOTE — Care Management Note (Signed)
    Page 1 of 1   09/14/2012     10:41:57 AM   CARE MANAGEMENT NOTE 09/14/2012  Patient:  [name], [name]   Account Number:  [id number]  Date Initiated:  09/11/2012  Documentation initiated by:  MAYO,HENRIETTA  Subjective/Objective Assessment:   79 yr-old female adm with dx of systolic failure; lives with family,  currently enrolled in [facility] program     Action/Plan:   Anticipated DC Date:  09/13/2012   Anticipated DC Plan:  HOME W HOME HEALTH SERVICES      DC Planning Services  CM consult  [facility]      Choice offered to / List presented to:             Status of service:  Completed, signed off Medicare Important Message given?   (If response is "NO", the following Medicare IM given date fields will be blank) Date Medicare IM given:   Date Additional Medicare IM given:    Discharge Disposition:  HOME W HOME HEALTH SERVICES  Per UR Regulation:  Reviewed for med. necessity/level of care/duration of stay  If discussed at Long Length of Stay Meetings, dates discussed:    Comments:  PCP: Dr Marny Lowenstein  09/13/12 Yvonne Downs,RN,BSN [phone] NOTIFIED EMILY, CSW WITH [facility] OF PT DISCHARGING HOME TODAY.  PT STATES SHE DOES NOT WANT [facility] TO TRANSPORT HOME; DAUGHTER TO TRANSPORT.  Yvonne Soucek,RN,BSN [phone] 09/12/12 WILL NEED TO CONTACT [facility] SOCIAL WORKER AT [phone] ON DAY OF DC, SO THAT [facility] CAN ARRANGE TRANSPORT HOME FOR PT.  WILL FOLLOW UP.

## 2012-09-11 NOTE — Progress Notes (Signed)
  Date: 09/11/2012  Patient name: [name]  Medical record number: [id number]  Date of birth: 03-07-1930   This patient has been seen and the plan of care was discussed with the house staff. Please see their note for complete details. I concur with their findings with the following additions/corrections:  She looks much better today from a clinical standpoint. She had acute on chronic systolic heart failure and has diuresed well. On exam, she has decreased breath sounds but no crackles. She is on [place] at this time, was on BiPAP prior. I agree with restarting home diuretics. She is s/p NSTEMI due to demand ischemia. Patient and family did not want any further evaluation by cardiology. Agree with plavix. She has been noted to have a decrease in her platelet count from 169 on 6/27 to 74 on 6/30. She was on heparin. Please send serotonin release assay to r/o HIT. Anemia of chronic inflammation stable, I do not suspect any bleeding. Expect D/C soon. PT/OT, out of bed to chair.  Jonah Blue, DO 09/11/2012, 1:02 PM

## 2012-09-11 NOTE — Progress Notes (Signed)
Report called to unit 2000 Sherri RN. Patient and belonging (nook with Consulting civil engineer , hearing aides, dentures and bag) transported to RM 2040.

## 2012-09-12 DIAGNOSIS — D509 Iron deficiency anemia, unspecified: Secondary | ICD-10-CM

## 2012-09-12 LAB — GLUCOSE, CAPILLARY

## 2012-09-12 LAB — CBC
MCH: 33.6 pg (ref 26.0–34.0)
MCV: 96.9 fL (ref 78.0–100.0)
Platelets: 80 10*3/uL — ABNORMAL LOW (ref 150–400)
RBC: 2.59 MIL/uL — ABNORMAL LOW (ref 3.87–5.11)
RDW: 14.2 % (ref 11.5–15.5)

## 2012-09-12 LAB — BASIC METABOLIC PANEL
CO2: 25 mEq/L (ref 19–32)
Calcium: 8.7 mg/dL (ref 8.4–10.5)
Creatinine, Ser: 1.38 mg/dL — ABNORMAL HIGH (ref 0.50–1.10)
GFR calc non Af Amer: 35 mL/min — ABNORMAL LOW (ref >90)
Glucose, Bld: 112 mg/dL — ABNORMAL HIGH (ref 70–99)
Sodium: 139 mEq/L (ref 135–145)

## 2012-09-12 MED ORDER — LEVALBUTEROL HCL 0.63 MG/3ML IN NEBU
0.6300 mg | INHALATION_SOLUTION | RESPIRATORY_TRACT | Status: DC | PRN
  Administered 2012-09-12: 0.63 mg via RESPIRATORY_TRACT
  Filled 2012-09-12: qty 3

## 2012-09-12 MED ORDER — HYDROCODONE-ACETAMINOPHEN 5-325 MG PO TABS
1.0000 | ORAL_TABLET | ORAL | Status: DC | PRN
  Administered 2012-09-12 – 2012-09-13 (×5): 1 via ORAL
  Filled 2012-09-12 (×5): qty 1

## 2012-09-12 NOTE — Evaluation (Signed)
Physical Therapy Evaluation Patient Details Name: [name] MRN: [id number] DOB: April 04, 1929 Today's Date: 09/12/2012 Time: 4098-1191 PT Time Calculation (min): 21 min  PT Assessment / Plan / Recommendation History of Present Illness  Pt admitted for SOB, CHF exacerbation  Clinical Impression  Pt functioning at baseline. Pt enrolled in [facility]. Granddaughter home with her on Wednesday. Pt safe to d/c home with assist of family when medically stable. See vital section for SpO2 sats during ambulation. Acute PT to con't to follow to improve activity tolerance.    PT Assessment  Patient needs continued PT services    Follow Up Recommendations  No PT follow up;Supervision/Assistance - 24 hour    Does the patient have the potential to tolerate intense rehabilitation      Barriers to Discharge        Equipment Recommendations  None recommended by PT    Recommendations for Other Services     Frequency Min 2X/week    Precautions / Restrictions Precautions Precautions: Fall Restrictions Weight Bearing Restrictions: No   Pertinent Vitals/Pain SATURATION QUALIFICATIONS: (This note is used to comply with regulatory documentation for home oxygen)  Patient Saturations on Room Air at Rest = 97%  Patient Saturations on Room Air while Ambulating = >88%  S/p 1 min of rest pt SpO2 at >94% on RA.      Mobility  Bed Mobility Bed Mobility: Supine to Sit Supine to Sit: 5: Supervision;HOB flat Transfers Transfers: Sit to Stand;Stand Pivot Transfers Sit to Stand: 4: Min guard;With upper extremity assist;From bed Stand Pivot Transfers: 4: Min guard (to [facility], v/c's for hand placement) Details for Transfer Assistance: v/c's for hand placement Ambulation/Gait Ambulation/Gait Assistance: 4: Min guard Ambulation Distance (Feet): 120 Feet Assistive device: Rolling walker Ambulation/Gait Assistance Details: SpO2 at >88% on RW. no episodes of LOB. safe RW management Gait  Pattern: Step-through pattern;Decreased stride length;Narrow base of support Gait velocity: wfl Stairs: No    Exercises     PT Diagnosis: Generalized weakness  PT Problem List: Decreased activity tolerance;Decreased mobility;Decreased balance;Decreased coordination PT Treatment Interventions: Gait training;Stair training;Functional mobility training;Therapeutic activities;Therapeutic exercise     PT Goals(Current goals can be found in the care plan section) Acute Rehab PT Goals PT Goal Formulation: With patient Time For Goal Achievement: 09/26/12 Potential to Achieve Goals: Good  Visit Information  Last PT Received On: 09/12/12 Assistance Needed: +1 History of Present Illness: Pt admitted for SOB, CHF exacerbation       Prior Functioning  Home Living Family/patient expects to be discharged to:: Private residence Living Arrangements: Children;Other relatives Available Help at Discharge:  (pt goes to [facility], [facility], family home in evening) Type of Home: House Home Access: Stairs to enter [employer] of Steps: 4 Entrance Stairs-Rails: Can reach both Home Layout: One level Home Equipment: Walker - 2 wheels;Grab bars - toilet;Bedside commode Prior Function Level of Independence: Needs assistance;Independent with assistive device(s) Gait / Transfers Assistance Needed: pt uses RW ADL's / Homemaking Assistance Needed: pt receives shower at [employer] Communication Communication: HOH Dominant Hand: Right    Cognition  Cognition Arousal/Alertness: Awake/alert Behavior During Therapy: WFL for tasks assessed/performed Overall Cognitive Status: Within Functional Limits for tasks assessed    Extremity/Trunk Assessment Upper Extremity Assessment Upper Extremity Assessment: Generalized weakness Lower Extremity Assessment Lower Extremity Assessment: Generalized weakness Cervical / Trunk Assessment Cervical / Trunk Assessment: Normal   Balance Balance Balance Assessed:  Yes Static Standing Balance Static Standing - Balance Support: Left upper extremity supported Static Standing -  Level of Assistance: 5: Stand by assistance Static Standing - Comment/# of Minutes: 1 min, pt used BSC and then was able to stand and perform pericare with std by assist  End of Session PT - End of Session Equipment Utilized During Treatment: Gait belt Activity Tolerance: Patient tolerated treatment well Patient left: in chair;with call bell/phone within reach Nurse Communication: Mobility status (SpO2 sats during amb)  GP     Christyn Gutkowski Marie 09/12/2012, 4:29 PM   Lewis Shock, PT, DPT Pager #: [phone] Office #: [phone]

## 2012-09-12 NOTE — Progress Notes (Signed)
  Date: 09/12/2012  Patient name: [name]  Medical record number: [id number]  Date of birth: [birth year]   This patient has been seen and the plan of care was discussed with the house staff. Please see their note for complete details. I concur with their findings with the following additions/corrections:  Feeling well today. Denies any complaints.  Her platelet count is stable, would continue to monitor (I sent HIT panel regardless). She is on SCD's.  No clinical evidence of bleeding. H/H stable. PT, wean O2. Continue home diuretics. She is well compensated from a cardiac standpoint. S/P NSTEMI from demand ischemia.  Expect D/C tomorrow.  Jonah Blue, DO 09/12/2012, 2:18 PM

## 2012-09-12 NOTE — Progress Notes (Signed)
Subjective: No complaints today.  Objective: Vital signs in last 24 hours: Temp:  [97.8 F (36.6 C)-98.5 F (36.9 C)] 97.8 F (36.6 C) (07/01 1324) Pulse Rate:  [60-78] 78 (07/01 1324) Resp:  [18-20] 18 (07/01 1324) BP: (116-139)/(56-70) 122/70 mmHg (07/01 1324) SpO2:  [95 %-100 %] 99 % (07/01 1324) Weight:  [70.8 kg (156 lb 1.4 oz)] 70.8 kg (156 lb 1.4 oz) (07/01 0459) Weight change:  Last BM Date: 09/11/12  Intake/Output from previous day: 06/30 0701 - 07/01 0700 In: 10 [I.V.:10] Out: 200 [Urine:200] Intake/Output this shift: Total I/O In: 480 [P.O.:480] Out: 750 [Urine:750]   General- not in any distress, alert, cooperative HEENT- atraumatic, normacephalic , on o2 by nasal canula. Cardiac- s1 s2 present , No murmurs, rubs, or gallops. Chest- clear to ascultation bilaterally. Abdomen- soft, non tender. Neuro- Grossly intact Extremities- normal power, sensation intact. Lab Results:  Recent Labs  09/11/12 0305 09/12/12 0350  WBC 4.7 5.1  HGB 8.4* 8.7*  HCT 24.3* 25.1*  PLT 74* 80*   BMET  Recent Labs  09/11/12 0305 09/12/12 0350  NA 135 139  K 3.8 3.7  CL 100 104  CO2 25 25  GLUCOSE 136* 112*  BUN 39* 37*  CREATININE 1.33* 1.38*  CALCIUM 8.5 8.7   Results for CHARMION, HAPKE (MRN [id number]) as of 09/12/2012 16:31  Ref. Range 09/11/2012 16:32  Glucose-Capillary Latest Range: 70-99 mg/dL 782 (H)  Results for MICHI, HERRMANN (MRN [id number]) as of 09/12/2012 16:31  Ref. Range 09/11/2012 06:45  Glucose-Capillary Latest Range: 70-99 mg/dL 578 (H)     Studies/Results: No results found.  Medications: I have reviewed the patient's current medications.  Assessment/Plan:  Ambulate, ambulatory pulse ox Physical therapy Wean off O2 PO pain meds. Possible discharge tomorrow.   LOS: 4 days   [place], [place] 09/12/2012, 4:17 PM

## 2012-09-12 NOTE — Progress Notes (Signed)
Paged teaching service, spoke with Dr. Andrey Campanile to verify if pt. Needs to have IV access.  Awaiting orders. Pt. Stable will continue to monitor.   Thane Edu, RN

## 2012-09-13 LAB — BASIC METABOLIC PANEL
CO2: 25 mEq/L (ref 19–32)
GFR calc non Af Amer: 40 mL/min — ABNORMAL LOW (ref >90)
Glucose, Bld: 114 mg/dL — ABNORMAL HIGH (ref 70–99)
Potassium: 3.6 mEq/L (ref 3.5–5.1)
Sodium: 139 mEq/L (ref 135–145)

## 2012-09-13 LAB — GLUCOSE, CAPILLARY
Glucose-Capillary: 112 mg/dL — ABNORMAL HIGH (ref 70–99)
Glucose-Capillary: 162 mg/dL — ABNORMAL HIGH (ref 70–99)

## 2012-09-13 MED ORDER — IPRATROPIUM BROMIDE 0.02 % IN SOLN: 0.5000 mg | Freq: Three times a day (TID) | RESPIRATORY_TRACT | Status: DC

## 2012-09-13 MED ORDER — ASPIRIN 81 MG PO TBEC: 81.0000 mg | DELAYED_RELEASE_TABLET | Freq: Every day | ORAL | Status: DC

## 2012-09-13 MED ORDER — CLOPIDOGREL BISULFATE 75 MG PO TABS: 75.0000 mg | ORAL_TABLET | Freq: Every day | ORAL | Status: DC

## 2012-09-13 MED ORDER — IPRATROPIUM BROMIDE 0.02 % IN SOLN
0.5000 mg | Freq: Three times a day (TID) | RESPIRATORY_TRACT | Status: DC
  Administered 2012-09-13: 0.5 mg via RESPIRATORY_TRACT
  Filled 2012-09-13: qty 2.5

## 2012-09-13 MED ORDER — LEVALBUTEROL HCL 0.63 MG/3ML IN NEBU
0.6300 mg | INHALATION_SOLUTION | Freq: Three times a day (TID) | RESPIRATORY_TRACT | Status: DC
  Administered 2012-09-13: 0.63 mg via RESPIRATORY_TRACT
  Filled 2012-09-13: qty 3

## 2012-09-13 NOTE — Progress Notes (Signed)
  Date: 09/13/2012  Patient name: [name]  Medical record number: [id number]  Date of birth: [birth year]   This patient has been seen and the plan of care was discussed with the house staff. Please see their note for complete details. I concur with their findings with the following additions/corrections: Feels well today. Denies CP, SOB, cough. No chills, no fever. Platelet count stable, but HIT panel needs to be followed up as outpatient. Follow PT recs. She is stable medically for D/C. Needs outpatient PCP f/u in next 1 week. She sees Dr. Clyde Lundborg, who takes excellent care of her.  Jonah Blue, DO 09/13/2012, 1:57 PM

## 2012-09-13 NOTE — Progress Notes (Signed)
Physical Therapy Treatment Patient Details Name: [name] MRN: [id number] DOB: 29-Aug-1929 Today's Date: 09/13/2012 Time: 1015-1040 PT Time Calculation (min): 25 min  PT Assessment / Plan / Recommendation  PT Comments   Pt admitted with CHF exacerbation and SOB. Pt currently with functional limitations due to decr endurance and generalized weakness.  Pt will benefit from skilled PT to increase their independence and safety with mobility to allow discharge to home.  '  Follow Up Recommendations  No PT follow up;Supervision/Assistance - 24 hour                 Equipment Recommendations  None recommended by PT        Frequency Min 2X/week   Progress towards PT Goals Progress towards PT goals: Progressing toward goals  Plan Current plan remains appropriate    Precautions / Restrictions Precautions Precautions: Fall Restrictions Weight Bearing Restrictions: No   Pertinent Vitals/Pain VSS, No pain   Mobility  Bed Mobility Bed Mobility: Supine to Sit Supine to Sit: 5: Supervision;HOB flat Transfers Transfers: Sit to Stand;Stand Pivot Transfers Sit to Stand: 4: Min guard;With upper extremity assist;From bed Stand Pivot Transfers: 4: Min guard (to [facility], v/c's for hand placement) Details for Transfer Assistance: v/c's for hand placement.  Able to clean herself when getting off toilet.   Ambulation/Gait Ambulation/Gait Assistance: 4: Min guard Ambulation Distance (Feet): 165 Feet Assistive device: Rolling walker Ambulation/Gait Assistance Details: SpO2 at 90 and > on RA.  No LOB.  Safe use of RW.   Gait Pattern: Step-through pattern;Decreased stride length;Narrow base of support Gait velocity: wfl Stairs: No Wheelchair Mobility Wheelchair Mobility: No    Exercises General Exercises - Lower Extremity Long Arc Quad: AROM;Both;15 reps;Seated Hip Flexion/Marching: AROM;15 reps;Seated    PT Goals (current goals can now be found in the care plan section) Acute Rehab PT  Goals PT Goal Formulation: With patient Time For Goal Achievement: 09/26/12 Potential to Achieve Goals: Good  Visit Information  Last PT Received On: 09/13/12 Assistance Needed: +1 History of Present Illness: Pt admitted for SOB, CHF exacerbation    Subjective Data  Subjective: "I would love to get up and move around."   Cognition  Cognition Arousal/Alertness: Awake/alert Behavior During Therapy: WFL for tasks assessed/performed Overall Cognitive Status: Within Functional Limits for tasks assessed    Balance  Static Standing Balance Static Standing - Balance Support: Left upper extremity supported Static Standing - Level of Assistance: 5: Stand by assistance Static Standing - Comment/# of Minutes: 1 min - pt used BSC and was able to stand and perform hygeine with stand by assist.   End of Session PT - End of Session Equipment Utilized During Treatment: Gait belt Activity Tolerance: Patient tolerated treatment well Patient left: with call bell/phone within reach;in bed;with bed alarm set Nurse Communication: Mobility status (SpO2 sats during amb)       INGOLD,Tyshae Stair 09/13/2012, 11:35 AM [facility] Acute Rehabilitation [phone] (pager)

## 2012-09-13 NOTE — Discharge Summary (Signed)
Name: [name] MRN: [id number] DOB: [birth year] 79 y.o. PCP: Jethro Bastos, MD  Date of Admission: 09/08/2012  7:16 PM Date of Discharge: 09/13/2012 Attending Physician: Jonah Blue, DO  Discharge Diagnosis:  Principal Problem:   Systolic CHF, acute on chronic (worsening LV function) EF of 20-25% on echo (12/16) Active Problems:   DM   HYPERTENSION   History of atrial fibrillation   Dementia in Alzheimer's disease, mild   Acute respiratory failure   Normocytic anemia   CKD (chronic kidney disease) stage 3, GFR 30-59 ml/min   NSTEMI (non-ST elevated myocardial infarction)  Discharge Medications:   Medication List    STOP taking these medications       Chlorhexidine Gluconate Cloth 2 % Pads     doxycycline 100 MG tablet  Commonly known as:  VIBRA-TABS     predniSONE 20 MG tablet  Commonly known as:  DELTASONE      TAKE these medications       acetaminophen 325 MG tablet  Commonly known as:  TYLENOL  Take 650 mg by mouth every 4 (four) hours as needed.     albuterol 108 (90 BASE) MCG/ACT inhaler  Commonly known as:  PROAIR HFA  Inhale 2 puffs into the lungs every 4 (four) hours as needed.     aspirin 81 MG EC tablet  Take 1 tablet (81 mg total) by mouth daily.     CALTRATE 600 PO  Take 1 tablet by mouth daily.     carvedilol 6.25 MG tablet  Commonly known as:  COREG  Take 1 tablet (6.25 mg total) by mouth 2 (two) times daily with a meal.     cholecalciferol 1000 UNITS tablet  Commonly known as:  VITAMIN D  Take 1,000 Units by mouth daily.     citalopram 20 MG tablet  Commonly known as:  CELEXA  Take 20 mg by mouth daily.     clopidogrel 75 MG tablet  Commonly known as:  PLAVIX  Take 1 tablet (75 mg total) by mouth daily after breakfast.     ferrous sulfate 325 (65 FE) MG tablet  Take 1 tablet (325 mg total) by mouth 2 (two) times daily with a meal.     furosemide 40 MG tablet  Commonly known as:  LASIX  Take 1 tablet (40 mg total) by  mouth 2 (two) times daily.     HYDROcodone-acetaminophen 5-500 MG per tablet  Commonly known as:  VICODIN  Take 1 tablet by mouth every 6 (six) hours. For pain     IMODIUM PO  Take 1 tablet by mouth daily as needed (loose stools).     ipratropium 0.02 % nebulizer solution  Commonly known as:  ATROVENT  Take 2.5 mLs (0.5 mg total) by nebulization 3 (three) times daily.     isosorbide mononitrate 30 MG 24 hr tablet  Commonly known as:  IMDUR  Take 30 mg by mouth daily.     lisinopril 10 MG tablet  Commonly known as:  PRINIVIL,ZESTRIL  Take 10 mg by mouth daily.     lovastatin 40 MG tablet  Commonly known as:  MEVACOR  Take 40 mg by mouth at bedtime.     mupirocin ointment 2 %  Commonly known as:  BACTROBAN  Apply 1 application topically 2 (two) times daily.     nystatin cream  Commonly known as:  MYCOSTATIN  Apply 1 application topically daily as needed. For rash     OCUVITE PO  Take 1 tablet by mouth daily.     omeprazole 20 MG capsule  Commonly known as:  PRILOSEC  Take 20 mg by mouth daily.     potassium chloride 10 MEQ tablet  Commonly known as:  K-DUR  Take 20 mEq by mouth 2 (two) times daily.     spironolactone 25 MG tablet  Commonly known as:  ALDACTONE  Take 1 tablet (25 mg total) by mouth daily.     traZODone 50 MG tablet  Commonly known as:  DESYREL  Take 1 tablet (50 mg total) by mouth at bedtime as needed for sleep.     triamcinolone cream 0.5 %  Commonly known as:  KENALOG  Apply topically 3 (three) times daily.        Disposition and follow-up:   [name] was discharged from [facility] in Stable condition.  At the hospital follow up visit please address:  1.  Follow up on the HIT panel labs. 2. Follow up on heart failure symptoms- orthopnea, dypsnea, edema.  Follow-up Appointments: Follow up at [facility] center on 09/14/2012.  Discharge Instructions: Discharge Orders   Future Orders Complete By Expires      (HEART FAILURE PATIENTS) Call MD:  Anytime you have any of the following symptoms: 1) 3 pound weight gain in 24 hours or 5 pounds in 1 week 2) shortness of breath, with or without a dry hacking cough 3) swelling in the hands, feet or stomach 4) if you have to sleep on extra pillows at night in order to breathe.  As directed     Call MD for:  difficulty breathing, headache or visual disturbances  As directed     Call MD for:  extreme fatigue  As directed     Call MD for:  persistant dizziness or light-headedness  As directed     Call MD for:  persistant nausea and vomiting  As directed     Call MD for:  severe uncontrolled pain  As directed     Call MD for:  temperature >100.4  As directed     Diet - low sodium heart healthy  As directed     Discharge instructions  As directed     Comments:      Continue these two new medications Aspirin and Plavix as prescribed. Its important to continue your lasix twice a day. Call your PCP if you gain more than 1 pound a day for 3 consecutive days.    Increase activity slowly  As directed        Consultations:  None.  Procedures Performed:  Dg Chest Port 1 View  09/08/2012   *RADIOLOGY REPORT*  Clinical Data: Severe shortness of breath  PORTABLE CHEST - 1 VIEW  Comparison: 03/03/2012; 02/29/2012  Findings: Grossly unchanged enlarged cardiac silhouette and mediastinal contours gave an decreased lung volumes.  The pulmonary vascular sure is indistinct with cephalization of flow.  Interval development of at least a small sized bilateral pleural effusions with worsening bibasilar heterogeneous / consolidative opacities, right greater than left.  No definite pneumothorax.  Chronic deformity of the right glenoid.  IMPRESSION: Findings compatible with pulmonary edema with at least small sized bilateral effusions and associated bibasilar opacities, atelectasis versus infiltrate.   Original Report Authenticated By: Tacey Ruiz, MD   Admission HPI: 79 y.o woman,  patient of [facility] and DNR, with PMH of severe CHF(EF 20-25% in 02/2012), A. Fib, COPD, HTN, HLD, Diabetes, anemia, dementia and CKD  who presents with acute dyspnea.  The history is provided by her son at bedside. The patient has been having SOB over the last 10 days for which she has been receiving treatment as COPD exacerbation by her PCP with Doxycycline since 4 days ago and Prednisone started today without much improvement. Patient was noticed to be in acute severe respiratory distress at around 6:00pm on evening evening of admission with everything happening rapidly. She was was cyanotic upon EMS arrival with Resp rate was in 50's and bibasilar rales. She was started on O2 therapy and transported to the ED and BiPAP was initiated with some improvement in dyspnea. Associated symptoms include wheezing, chest pain without a clear history of diaphoresis, or palpitations. She can not characterize her chest pain but she describes pain in her left shoulder which might be chronic as per her son. No increased lower extremity edema. No history of cough, fevers, or increased sputum production. She is compliant with her home regimen for heart failure including furosemide and spironolactone. On chart review, she has gained 16Lb over the last 6 months when she was admitted for a similar episode. No dietary indiscretions, excessive salt intake.  She has had multiple admissions for acute exacerbation of CHF, with the most recently December, 2013. She declined ICD or Life Vest.   Physical Exam:  Blood pressure 162/87, pulse 84, resp. rate 19, weight 173 lb 2 oz (78.529 kg), SpO2 99.00%.  General: Thin old woman, on BiPAP, unable to provide much history, in acute moderate distressed, cooperative with exam  Head: atraumatic, normocephalic,  Eye: pupils equal, round and reactive; sclera anicteric; normal conjunctiva  Nose/throat: oropharynx clear, moist mucous membranes, pink gums  Neck: supple, no carotid bruits, thyroid  normal in size and without palpable nodules  Lungs/Chest wall: No chest wall tenderness, bibasilar rales right mor than left, increased work of breathing  Heart: tachycardia, regular rhythm; no murmurs. JVD distended to the level of the mandible  Pulses: radial and dorsalis pedis pulses are 2+ and symmetric  Abdomen: Normal fullness, no rebound, guarding, or rigidity; normal bowel sounds; no masses or organomegaly  Skin: warm, dry, intact, normal turgor, no rashes  Extremities: No peripheral edema, clubbing, or cyanosis  Neurologic: A&O X3, CN II - XII are grossly intact. Motor strength is 5/5 in the all 4 extremities, Sensations intact to light touch.  Psych: patient is alert and oriented, mood and affect are normal and congruent, thought content is normal without delusions, thought process is linear, speech is normal and non-pressured, behavior is normal  Lab results:  Basic Metabolic Panel:   Recent Labs   09/08/12 1925   NA  133*   K  5.0   CL  98   CO2  24   GLUCOSE  269*   BUN  32*   CREATININE  1.23*   CALCIUM  9.3   AG: 11  Liver Function Tests:   Recent Labs   09/08/12 1925   AST  29   ALT  17   ALKPHOS  89   BILITOT  1.0   PROT  6.5   ALBUMIN  3.7    CBC:   Recent Labs   09/08/12 1925   WBC  6.6   NEUTROABS  6.0   HGB  10.2*   HCT  30.2*   MCV  96.5   PLT  169     09/08/2012   Sample type  ARTERIAL   pH, Arterial  7.372   pCO2  arterial  37.7   pO2, Arterial  161   Bicarbonate  21.9   TCO2  23   Acid-base deficit  -3   O2 Saturation  99.0   Recent Labs  Lab  03/03/2012  09/08/12 2329   PROBNP  13270  8855.0*    Recent Labs  Lab  09/09/12 0018  09/09/12 0415   TROPONINI  0.41*  0.40[facility] Course by problem list: Principal Problem:   Systolic CHF, acute on chronic (worsening LV function) EF of 20-25% on echo (12/16) Active Problems:   DM   HYPERTENSION   History of atrial fibrillation   Dementia in Alzheimer's disease, mild    Acute respiratory failure   Normocytic anemia   CKD (chronic kidney disease) stage 3, GFR 30-59 ml/min   NSTEMI (non-ST elevated myocardial infarction)   # Acute on Chronic Systolic HF: EF 20-25% on echo of 02/28/2012. Likely trigger is atrial fibrillation vs unsustained VT vs ACS , serial trop were checked.   PE was thought to be unlikely with Well's score of 1.5 points (1.3% pretest proba). Family reports compliance with medications, but  not dietary indescritions. Her weight was up by 16Lb (157>>173) from her previous admission in 02/2012.After diuresis wt was down to 71kg prior to d/c home. On admission clinical signs of CHF including  JVD is elevated and impressive bibasilar rales were noted, On admission Lab and Radiological signs of CHF were noted including elevation of Pro BNP 8855 and  CXR reveals pulmonary edema with effusions. On admission pt was dypsneic and hypoxic, improved ith BIPAP and then later transitioned to nasal canula. At the time of discharge pt was w/o hypoxia on RA. Patient's CE did mildly bump to 1.15, and NSTEMI was thought to be d/t demand ischemia. There was no evidence of significant COPD exacerbation during this hospitalization. patient felt better with diuresis. Renal panel was watched closely and Lasix dosage adjusted.  restarted home Lasix dose (40mg  daily)  prior  to discharge. Continued Coreg 6.25 mg twice daily, Imdur 30 mg daily,   Lisinopril 10 mg daily and Spironolactone 25 mg daily (will need BMP recheck from time to time due to hyperkalemia risk with this combo especially as he is also on potassium supplements). Despite very low EF and increased risk of life threatehning arrythmias and sudden death pt previously declined Technical sales engineer and AICD  # A.Fibrillation:  Probably Paroxysmal , despite high [place] score (4) and neg endoscopy pt and family decided against anticoagulation due to concerns about Gi blood. Risk/benefits of anticoagulation d/w pt and family previously.  EKG w/o active A. Fib.at this time. C/n coreg . Not previously on anticoagulation 2/2 recent GI bleed, but on ASA and Plavix now 2/2 NSTEMI (please note that ASA/Plavix combo is NOT just for Afib, but mainly for CAD due to recent NSTEMI). Increased risk of bleeding with this combo noted.  # HTN: BP stable. D/c home on Coreg 6.25mg  bid, Lisinopril 10mg  daily, aldactone 25mg  daily and Lasix 40mg  bid as well as Imdur 30mg  daily.  # DM: Suspect Steroid and stress induced Hyperglycemia (glucose intolerance) She does not take hypoglycemics. Had 40 mg of oral prednisone on the day of admission. CBG 269 on admission, glucose < 140 mostly  # Normocytic Anemia: Baseline hemoglobin of around 10. As her ECP note, anemia is presumed to be from GI bleeding, likely from diverticulosis. On 05/08/2012 she received an iron effusion. FOBT + 08/2010, negative in 02/2012. Unclear  whether she has had colonoscopy. No h/o of bloody stools or melena. Hgb 10.2 on admission . Hgb stayed < 9 mostly. Pt will need CBC recheck post d/c   # CKD 3: Baseline GFR of 45-56 with a creatinine of around 1.2. Worsened with CHF and with diuresis. Stabilized however.  # Code Status: DNR/DNI   Dispo: Disharged home in improved condition.Hemodynamically stable w/o hypoxia.   The patient does have a current PCP Jethro Bastos, MD) and does not need an [facility] hospital follow-up appointment after discharge.  The patient does not have transportation limitations that hinder transportation to clinic appointments.  Discharge Vitals:   BP 134/81  Pulse 75  Temp(Src) 97.9 F (36.6 C) (Oral)  Resp 18  Ht 5\' 2"  (1.575 m)  Wt 71.532 kg (157 lb 11.2 oz)  BMI 28.84 kg/m2  SpO2 97%  Discharge Labs:  Results for orders placed during the hospital encounter of 09/08/12 (from the past 24 hour(s))  GLUCOSE, CAPILLARY     Status: Abnormal   Collection Time    09/12/12  4:49 PM      Result Value Range   Glucose-Capillary 142 (*) 70 - 99 mg/dL    Comment 1 Notify RN     Comment 2 Documented in Chart    GLUCOSE, CAPILLARY     Status: Abnormal   Collection Time    09/12/12  9:02 PM      Result Value Range   Glucose-Capillary 198 (*) 70 - 99 mg/dL  GLUCOSE, CAPILLARY     Status: Abnormal   Collection Time    09/13/12  6:01 AM      Result Value Range   Glucose-Capillary 112 (*) 70 - 99 mg/dL  BASIC METABOLIC PANEL     Status: Abnormal   Collection Time    09/13/12  6:29 AM      Result Value Range   Sodium 139  135 - 145 mEq/L   Potassium 3.6  3.5 - 5.1 mEq/L   Chloride 102  96 - 112 mEq/L   CO2 25  19 - 32 mEq/L   Glucose, Bld 114 (*) 70 - 99 mg/dL   BUN 35 (*) 6 - 23 mg/dL   Creatinine, Ser 4.54 (*) 0.50 - 1.10 mg/dL   Calcium 8.5  8.4 - 09.8 mg/dL   GFR calc non Af Amer 40 (*) >90 mL/min   GFR calc Af Amer 46 (*) >90 mL/min  GLUCOSE, CAPILLARY     Status: Abnormal   Collection Time    09/13/12 11:12 AM      Result Value Range   Glucose-Capillary 162 (*) 70 - 99 mg/dL    Signed: Kennis Carina, MD 09/13/2012, 12:28 PM   Time Spent on Discharge: 40 minutes

## 2012-09-13 NOTE — Progress Notes (Signed)
Subjective: No complaints today.  Objective: Vital signs in last 24 hours: Temp:  [97.8 F (36.6 C)-97.9 F (36.6 C)] 97.9 F (36.6 C) (07/02 0334) Pulse Rate:  [70-80] 75 (07/02 0518) Resp:  [18] 18 (07/02 0334) BP: (119-145)/(68-81) 134/81 mmHg (07/02 0518) SpO2:  [93 %-99 %] 97 % (07/02 0518) Weight:  [71.532 kg (157 lb 11.2 oz)] 71.532 kg (157 lb 11.2 oz) (07/02 0334) Weight change: 0.732 kg (1 lb 9.8 oz) Last BM Date: 09/12/12  Intake/Output from previous day: 07/01 0701 - 07/02 0700 In: 480 [P.O.:480] Out: 1600 [Urine:1600] Intake/Output this shift: Total I/O In: 240 [P.O.:240] Out: 300 [Urine:300]  General appearance: alert, cooperative and no distress Head: Normocephalic, without obvious abnormality, atraumatic Resp: clear to auscultation bilaterally, Pulse ox- 97% on room air. Chest wall: no tenderness Cardio: S1, S2 normal Extremities: extremities normal, atraumatic, no cyanosis or edema Neurologic: Alert and oriented X 3, normal strength and tone. Normal symmetric reflexes. Normal coordination and gait Mental status: Alert, oriented, thought content appropriate  Lab Results:  Recent Labs  09/11/12 0305 09/12/12 0350  WBC 4.7 5.1  HGB 8.4* 8.7*  HCT 24.3* 25.1*  PLT 74* 80*   BMET  Recent Labs  09/12/12 0350 09/13/12 0629  NA 139 139  K 3.7 3.6  CL 104 102  CO2 25 25  GLUCOSE 112* 114*  BUN 37* 35*  CREATININE 1.38* 1.25*  CALCIUM 8.7 8.5    Studies/Results: No results found.  Medications: I have reviewed the patient's current medications.  Assessment/Plan:  LOS: 5 days  Acute on Chronic CHF Resolved- breathing room air, ambulating, seen by PT.                                                          Thrombocytopenia- HIT panel pending, for follow up with primary care Physician.  A Fib- Currently on Coreg      [name], [name] 09/13/2012, 11:23 AM

## 2012-09-14 LAB — HEPARIN INDUCED THROMBOCYTOPENIA PNL
Heparin Induced Plt Ab: NEGATIVE
Heparin Induced Plt Ab: NEGATIVE
Patient O.D.: 0.016
UFH Low Dose 0.1 IU/mL: 1 % Release
UFH Low Dose 0.5 IU/mL: 0 % Release
UFH SRA Result: NEGATIVE
UFH SRA Result: NEGATIVE

## 2012-09-18 NOTE — Discharge Summary (Signed)
  Date: 09/18/2012  Patient name: [name]  Medical record number: [id number]  Date of birth: Apr 23, 1929   This patient has been discussed with the house staff. Please see their note for complete details. I concur with their findings and plan.  Jonah Blue, DO 09/18/2012, 1:25 PM

## 2012-09-29 ENCOUNTER — Other Ambulatory Visit (HOSPITAL_COMMUNITY): Payer: Self-pay | Admitting: *Deleted

## 2012-10-02 ENCOUNTER — Encounter (HOSPITAL_COMMUNITY)
Admission: RE | Admit: 2012-10-02 | Discharge: 2012-10-02 | Disposition: A | Discharge status: Home / Self Care | Payer: Medicare (Managed Care) | Source: Ambulatory Visit | Attending: Family Medicine | Admitting: Family Medicine

## 2012-10-02 DIAGNOSIS — D509 Iron deficiency anemia, unspecified: Secondary | ICD-10-CM | POA: Insufficient documentation

## 2012-10-02 LAB — PREPARE RBC (CROSSMATCH)

## 2012-10-03 ENCOUNTER — Encounter (HOSPITAL_COMMUNITY)
Admission: RE | Admit: 2012-10-03 | Discharge: 2012-10-03 | Disposition: A | Discharge status: Home / Self Care | Payer: Medicare (Managed Care) | Source: Ambulatory Visit | Attending: Family Medicine | Admitting: Family Medicine

## 2012-10-03 MED ORDER — SODIUM CHLORIDE 0.9 % IV SOLN
Freq: Once | INTRAVENOUS | Status: AC
  Administered 2012-10-03: 12:00:00 via INTRAVENOUS

## 2012-10-03 MED ORDER — FUROSEMIDE 10 MG/ML IJ SOLN
20.0000 mg | Freq: Once | INTRAMUSCULAR | Status: AC
  Administered 2012-10-03: 20 mg via INTRAVENOUS
  Filled 2012-10-03 (×2): qty 2

## 2012-10-03 MED ORDER — SODIUM CHLORIDE 0.9 % IV SOLN
Freq: Once | INTRAVENOUS | Status: AC
  Administered 2012-10-03: 09:00:00 via INTRAVENOUS

## 2012-10-04 LAB — TYPE AND SCREEN
ABO/RH(D): A POS
Antibody Screen: NEGATIVE
Unit division: 0

## 2012-10-06 ENCOUNTER — Encounter (HOSPITAL_COMMUNITY)
Admission: RE | Admit: 2012-10-06 | Discharge: 2012-10-06 | Disposition: A | Discharge status: Home / Self Care | Payer: Medicare (Managed Care) | Source: Ambulatory Visit | Attending: Family Medicine | Admitting: Family Medicine

## 2012-10-06 DIAGNOSIS — D649 Anemia, unspecified: Secondary | ICD-10-CM | POA: Insufficient documentation

## 2012-10-06 LAB — PREPARE RBC (CROSSMATCH)

## 2012-10-09 ENCOUNTER — Encounter (HOSPITAL_COMMUNITY)
Admission: RE | Admit: 2012-10-09 | Discharge: 2012-10-09 | Disposition: A | Discharge status: Home / Self Care | Payer: Medicare (Managed Care) | Source: Ambulatory Visit | Attending: Family Medicine | Admitting: Family Medicine

## 2012-10-09 MED ORDER — FUROSEMIDE 10 MG/ML IJ SOLN
20.0000 mg | Freq: Once | INTRAMUSCULAR | Status: AC
  Administered 2012-10-09: 20 mg via INTRAVENOUS
  Filled 2012-10-09 (×2): qty 2

## 2012-10-10 LAB — TYPE AND SCREEN
ABO/RH(D): A POS
Antibody Screen: NEGATIVE
Unit division: 0

## 2012-10-27 ENCOUNTER — Other Ambulatory Visit (HOSPITAL_COMMUNITY): Payer: Self-pay | Admitting: *Deleted

## 2012-10-30 ENCOUNTER — Encounter (HOSPITAL_COMMUNITY)
Admission: RE | Admit: 2012-10-30 | Discharge: 2012-10-30 | Disposition: A | Discharge status: Home / Self Care | Payer: Medicare (Managed Care) | Source: Ambulatory Visit | Attending: Family Medicine | Admitting: Family Medicine

## 2012-10-30 DIAGNOSIS — D509 Iron deficiency anemia, unspecified: Secondary | ICD-10-CM | POA: Insufficient documentation

## 2012-10-30 LAB — PREPARE RBC (CROSSMATCH)

## 2012-10-31 ENCOUNTER — Ambulatory Visit (HOSPITAL_COMMUNITY)
Admission: RE | Admit: 2012-10-31 | Discharge: 2012-10-31 | Disposition: A | Discharge status: Home / Self Care | Payer: Medicare (Managed Care) | Source: Ambulatory Visit | Attending: Family Medicine | Admitting: Family Medicine

## 2012-10-31 DIAGNOSIS — I2589 Other forms of chronic ischemic heart disease: Secondary | ICD-10-CM | POA: Insufficient documentation

## 2012-10-31 DIAGNOSIS — I5022 Chronic systolic (congestive) heart failure: Secondary | ICD-10-CM | POA: Insufficient documentation

## 2012-10-31 DIAGNOSIS — F0280 Dementia in other diseases classified elsewhere without behavioral disturbance: Secondary | ICD-10-CM | POA: Insufficient documentation

## 2012-10-31 DIAGNOSIS — I129 Hypertensive chronic kidney disease with stage 1 through stage 4 chronic kidney disease, or unspecified chronic kidney disease: Secondary | ICD-10-CM | POA: Insufficient documentation

## 2012-10-31 DIAGNOSIS — G309 Alzheimer's disease, unspecified: Secondary | ICD-10-CM | POA: Insufficient documentation

## 2012-10-31 DIAGNOSIS — J449 Chronic obstructive pulmonary disease, unspecified: Secondary | ICD-10-CM | POA: Insufficient documentation

## 2012-10-31 DIAGNOSIS — N189 Chronic kidney disease, unspecified: Secondary | ICD-10-CM | POA: Insufficient documentation

## 2012-10-31 DIAGNOSIS — E119 Type 2 diabetes mellitus without complications: Secondary | ICD-10-CM | POA: Insufficient documentation

## 2012-10-31 DIAGNOSIS — D649 Anemia, unspecified: Secondary | ICD-10-CM | POA: Insufficient documentation

## 2012-10-31 DIAGNOSIS — I509 Heart failure, unspecified: Secondary | ICD-10-CM | POA: Insufficient documentation

## 2012-10-31 DIAGNOSIS — J4489 Other specified chronic obstructive pulmonary disease: Secondary | ICD-10-CM | POA: Insufficient documentation

## 2012-10-31 MED ORDER — FUROSEMIDE 10 MG/ML IJ SOLN
20.0000 mg | Freq: Once | INTRAMUSCULAR | Status: AC
  Administered 2012-10-31: 20 mg via INTRAVENOUS
  Filled 2012-10-31 (×2): qty 2

## 2012-11-01 LAB — TYPE AND SCREEN
ABO/RH(D): A POS
Antibody Screen: NEGATIVE
Unit division: 0

## 2012-11-03 ENCOUNTER — Other Ambulatory Visit (HOSPITAL_COMMUNITY): Payer: Self-pay

## 2012-11-06 ENCOUNTER — Encounter (HOSPITAL_COMMUNITY)
Admission: RE | Admit: 2012-11-06 | Discharge: 2012-11-06 | Disposition: A | Discharge status: Home / Self Care | Payer: Medicare (Managed Care) | Source: Ambulatory Visit | Attending: Family Medicine | Admitting: Family Medicine

## 2012-11-06 DIAGNOSIS — D509 Iron deficiency anemia, unspecified: Secondary | ICD-10-CM | POA: Insufficient documentation

## 2012-11-06 LAB — PREPARE RBC (CROSSMATCH)

## 2012-11-07 ENCOUNTER — Encounter (HOSPITAL_COMMUNITY)
Admission: RE | Admit: 2012-11-07 | Discharge: 2012-11-07 | Disposition: A | Discharge status: Home / Self Care | Payer: Medicare (Managed Care) | Source: Ambulatory Visit | Attending: Family Medicine | Admitting: Family Medicine

## 2012-11-07 MED ORDER — FUROSEMIDE 10 MG/ML IJ SOLN
20.0000 mg | Freq: Once | INTRAMUSCULAR | Status: AC
  Administered 2012-11-07: 20 mg via INTRAVENOUS
  Filled 2012-11-07: qty 2

## 2012-11-08 LAB — TYPE AND SCREEN
ABO/RH(D): A POS
Antibody Screen: NEGATIVE
Unit division: 0

## 2012-11-29 ENCOUNTER — Telehealth: Payer: Self-pay | Admitting: Hematology & Oncology

## 2012-11-29 NOTE — Telephone Encounter (Signed)
[name] aware of referral

## 2012-11-30 ENCOUNTER — Telehealth: Payer: Self-pay | Admitting: Hematology and Oncology

## 2012-11-30 NOTE — Telephone Encounter (Signed)
C/D 11/30/12 for appt. 12/18/12

## 2012-11-30 NOTE — Telephone Encounter (Signed)
S/w Sonja at [facility] and gve NP appt 10/06 @ 10:30 w/Dr. Bertis Ruddy Referring Dr. Marny Lowenstein Dx- Persistent Anemia.

## 2012-12-05 ENCOUNTER — Ambulatory Visit (INDEPENDENT_AMBULATORY_CARE_PROVIDER_SITE_OTHER): Admitting: Surgery

## 2012-12-05 ENCOUNTER — Encounter (INDEPENDENT_AMBULATORY_CARE_PROVIDER_SITE_OTHER): Payer: Self-pay | Admitting: Surgery

## 2012-12-05 VITALS — BP 160/78 | HR 76 | Temp 97.8°F | Resp 18 | Ht 61.0 in | Wt 155.0 lb

## 2012-12-05 DIAGNOSIS — K5900 Constipation, unspecified: Secondary | ICD-10-CM

## 2012-12-05 DIAGNOSIS — K5909 Other constipation: Secondary | ICD-10-CM | POA: Insufficient documentation

## 2012-12-05 DIAGNOSIS — D509 Iron deficiency anemia, unspecified: Secondary | ICD-10-CM

## 2012-12-05 DIAGNOSIS — K648 Other hemorrhoids: Secondary | ICD-10-CM | POA: Insufficient documentation

## 2012-12-05 NOTE — Patient Instructions (Addendum)
I have banded all three internal hemorrhoid piles.  The bands should naturally fall off.  Some mild bleeding occurs in the first week or so, then should calm down.  If you have persistent bleeding from your anus, consider colonoscopy to rule out another source In your colon.  ANORECTAL SURGERY (BANDING) : POST OP INSTRUCTIONS  1. Take your usually prescribed home medications unless otherwise directed. 2. DIET: Follow a light bland diet the first 24 hours after arrival home, such as soup, liquids, crackers, etc.  Be sure to include lots of fluids daily.  Avoid fast food or heavy meals as your are more likely to get nauseated.  Eat a low fat the next few days after surgery.   3. PAIN CONTROL: a. Pain is best controlled by a usual combination of three different methods TOGETHER: i. Ice/Heat ii. Over the counter pain medication iii. Prescription pain medication b. Most patients will experience some swelling and discomfort in the anus/rectal area. and incisions.  Ice packs or heat (30-60 minutes up to 6 times a day) will help. Use ice for the first few days to help decrease swelling and bruising, then switch to heat such as warm towels, sitz baths, warm baths, etc to help relax tight/sore spots and speed recovery.  Some people prefer to use ice alone, heat alone, alternating between ice & heat.  Experiment to what works for you.  Swelling and bruising can take several weeks to resolve.   c. It is helpful to take an over-the-counter pain medication regularly for the first few weeks.  Choose one of the following that works best for you: i. Naproxen (Aleve, etc)  Two 220mg  tabs twice a day ii. Ibuprofen (Advil, etc) Three 200mg  tabs four times a day (every meal & bedtime) iii. Acetaminophen (Tylenol, etc) 500-650mg  four times a day (every meal & bedtime) d. A  prescription for pain medication (such as oxycodone, hydrocodone, etc) should be given to you upon discharge.  Take your pain medication as  prescribed.  i. If you are having problems/concerns with the prescription medicine (does not control pain, nausea, vomiting, rash, itching, etc), please call us [phone] to see if we need to switch you to a different pain medicine that will work better for you and/or control your side effect better. ii. If you need a refill on your pain medication, please contact your pharmacy.  They will contact our office to request authorization. Prescriptions will not be filled after 5 pm or on week-ends. 4. KEEP YOUR BOWELS REGULAR a. The goal is one bowel movement a day b. Avoid getting constipated.  Between the surgery and the pain medications, it is common to experience some constipation.  Increasing fluid intake and taking a fiber supplement (such as Metamucil, Citrucel, FiberCon, MiraLax, etc) 1-2 times a day regularly will usually help prevent this problem from occurring.  A mild laxative (prune juice, Milk of Magnesia, MiraLax, etc) should be taken according to package directions if there are no bowel movements after 48 hours. c. Watch out for diarrhea.  If you have many loose bowel movements, simplify your diet to bland foods & liquids for a few days.  Stop any stool softeners and decrease your fiber supplement.  Switching to mild anti-diarrheal medications (Kayopectate, Pepto Bismol) can help.  If this worsens or does not improve, please call us.  5. Wound Care a. Remove your bandages the day after surgery.  Unless discharge instructions indicate otherwise, leave your bandage dry and in place  overnight.  Remove the bandage during your first bowel movement.   b. Allow the wound packing to fall out over the next few days.  You can trim exposed gauze / ribbon as it falls out.  You do not need to repack the wound unless instructed otherwise.  Wear an absorbent pad or soft cotton gauze in your underwear as needed to catch any drainage and help keep the area  c. Keep the area clean and dry.  Bathe / shower  every day.  Keep the area clean by showering / bathing over the incision / wound.   It is okay to soak an open wound to help wash it.  Wet wipes or showers / gentle washing after bowel movements is often less traumatic than regular toilet paper. d. Bonita Quin may have some styrofoam-like soft packing in the rectum which will come out with the first bowel movement.  e. You will often notice bleeding with bowel movements.  This should slow down by the end of the first week of surgery f. Expect some drainage.  This should slow down, too, by the end of the first week of surgery.  Wear an absorbent pad or soft cotton gauze in your underwear until the drainage stops. 6. ACTIVITIES as tolerated:   a. You may resume regular (light) daily activities beginning the next day-such as daily self-care, walking, climbing stairs-gradually increasing activities as tolerated.  If you can walk 30 minutes without difficulty, it is safe to try more intense activity such as jogging, treadmill, bicycling, low-impact aerobics, swimming, etc. b. Save the most intensive and strenuous activity for last such as sit-ups, heavy lifting, contact sports, etc  Refrain from any heavy lifting or straining until you are off narcotics for pain control.   c. DO NOT PUSH THROUGH PAIN.  Let pain be your guide: If it hurts to do something, don't do it.  Pain is your body warning you to avoid that activity for another week until the pain goes down. d. You may drive when you are no longer taking prescription pain medication, you can comfortably sit for long periods of time, and you can safely maneuver your car and apply brakes. e. Bonita Quin may have sexual intercourse when it is comfortable.  7. FOLLOW UP in our office a. Please call [facility] at [phone] to set up an appointment to see your surgeon in the office for a follow-up appointment approximately 2 weeks after your surgery. b. Make sure that you call for this appointment the day you arrive home to  insure a convenient appointment time. 10. IF YOU HAVE DISABILITY OR FAMILY LEAVE FORMS, BRING THEM TO THE OFFICE FOR PROCESSING.  DO NOT GIVE THEM TO YOUR DOCTOR.        WHEN TO CALL us [phone]: 1. Poor pain control 2. Reactions / problems with new medications (rash/itching, nausea, etc)  3. Fever over 101.5 F (38.5 C) 4. Inability to urinate 5. Nausea and/or vomiting 6. Worsening swelling or bruising 7. Continued bleeding from incision. 8. Increased pain, redness, or drainage from the incision  The clinic staff is available to answer your questions during regular business hours (8:30am-5pm).  Please don't hesitate to call and ask to speak to one of our nurses for clinical concerns.   A surgeon from [facility] is always on call at the hospitals   If you have a medical emergency, go to the nearest emergency room or call 911.    [facility], [place]  [address], [address], [place], [place]  [address] ? MAIN: [phone] ? TOLL FREE: [phone] ? FAX [phone] [email or web address]  HEMORRHOIDS  The rectum is the last foot of your colon, and it naturally stretches to hold stool.  Hemorrhoidal piles are natural clusters of blood vessels that help the rectum and anal canal stretch to hold stool and allow bowel movements to eliminate feces.   Hemorrhoids are abnormally swollen blood vessels in the rectum.  Too much pressure in the rectum causes hemorrhoids by forcing blood to stretch and bulge the walls of the veins, sometimes even rupturing them.  Hemorrhoids can become like varicose veins you might see on a person's legs.  Most people will develop a flare of hemorrhoids in their lifetime.  When bulging hemorrhoidal veins are irritated, they can swell, burn, itch, cause pain, and bleed.  Most flares will calm down gradually own within a few weeks.  However, once hemorrhoids are created, they are difficult to get rid of completely  and tend to flare more easily than the first flare.   Fortunately, good habits and simple medical treatment usually control hemorrhoids well, and surgery is needed only in severe cases. Types of Hemorrhoids:  Internal hemorrhoids usually don't initially hurt or itch; they are deep inside the rectum and usually have no sensation. If they begin to push out (prolapse), pain and burning can occur.  However, internal hemorrhoids can bleed.  Anal bleeding should not be ignored since bleeding could come from a dangerous source like colorectal cancer, so persistent rectal bleeding should be investigated by a doctor, sometimes with a colonoscopy.  External hemorrhoids cause most of the symptoms - pain, burning, and itching. Nonirritated hemorrhoids can look like small skin tags coming out of the anus.   Thrombosed hemorrhoids can form when a hemorrhoid blood vessel bursts and causes the hemorrhoid to suddenly swell.  A purple blood clot can form in it and become an excruciatingly painful lump at the anus. Because of these unpleasant symptoms, immediate incision and drainage by a surgeon at an office visit can provide much relief of the pain.    PREVENTION Avoiding the most frequent causes listed below will prevent most cases of hemorrhoids: Constipation Hard stools Diarrhea  Constant sitting  Straining with bowel movements Sitting on the toilet for a long time  Severe coughing  episodes Pregnancy / Childbirth  Heavy Lifting  Sometimes avoiding the above triggers is difficult:  How can you avoid sitting all day if you have a seated job? Also, we try to avoid coughing and diarrhea, but sometimes it's beyond your control.  Still, there are some practical hints to help: Keep the anal and genital area clean.  Moistened tissues such as flushable wet wipes are less irritating than toilet paper.  Using irrigating showers or bottle irrigation washing gently cleans this sensitive area.   Avoid dry toilet paper when  cleaning after bowel movements.  Marland Kitchen Keep the anal and genital area dry.  Lightly pat the rectal area dry.  Avoid rubbing.  Talcum or baby powders can help GET YOUR STOOLS SOFT.   This is the most important way to prevent irritated hemorrhoids.  Hard stools are like sandpaper to the anorectal canal and will cause more problems.  The goal: ONE SOFT BOWEL MOVEMENT A DAY!  BMs from every other day to 3 times a day is a tolerable range Treat coughing, diarrhea and constipation early since irritated hemorrhoids may soon follow.  If your main  job activity is seated, always stand or walk during your breaks. Make it a point to stand and walk at least 5 minutes every hour and try to shift frequently in your chair to avoid direct rectal pressure.  Always exhale as you strain or lift. Don't hold your breath.  Do not delay or try to prevent a bowel movement when the urge is present. Exercise regularly (walking or jogging 60 minutes a day) to stimulate the bowels to move. No reading or other activity while on the toilet. If bowel movements take longer than 5 minutes, you are too constipated. AVOID CONSTIPATION Drink plenty of liquids (1 1/2 to 2 quarts of water and other fluids a day unless fluid restricted for another medical condition). Liquids that contain caffeine (coffee a, tea, soft drinks) can be dehydrating and should be avoided until constipation is controlled. Consider minimizing milk, as dairy products may be constipating. Eat plenty of fiber (30g a day ideal, more if needed).  Fiber is the undigested part of plant food that passes into the colon, acting as "natures broom" to encourage bowel motility and movement.  Fiber can absorb and hold large amounts of water. This results in a larger, bulkier stool, which is soft and easier to pass.  Eating foods high in fiber - 12 servings - such as  Vegetables: Root (potatoes, carrots, turnips), Leafy green (lettuce, salad greens, celery, spinach), High residue  (cabbage, broccoli, etc.) Fruit: Fresh, Dried (prunes, apricots, cherries), Stewed (applesauce)  Whole grain breads, pasta, whole wheat Bran cereals, muffins, etc. Consider adding supplemental bulking fiber which retains large volumes of water: Psyllium ground seeds (native plant from [place])--available as Metamucil, Konsyl, Effersyllium, Per Diem Fiber, or the less expensive generic forms.  Citrucel  (methylcellulose wood fiber) . FiberCon (Polycarbophil) Polyethylene Glycol - and "artificial" fiber commonly called Miralax or Glycolax.  It is helpful for people with gassy or bloated feelings with regular fiber Flax Seed - a less gassy natural fiber  Laxatives can be useful for a short period if constipation is severe Osmotics (Milk of Magnesia, Fleets Phospho-Soda, Magnesium Citrate)  Stimulants (Senokot,   Castor Oil,  Dulcolax, Ex-Lax)    Laxatives are not a good long-term solution as it can stress the bowels and cause too much mineral loss and dehydration.   Avoid taking laxatives for more than 7 days in a row.  AVOID DIARRHEA Switch to liquids and simpler foods for a few days to avoid stressing your intestines further. Avoid dairy products (especially milk & ice cream) for a short time.  The intestines often can lose the ability to digest lactose when stressed. Avoid foods that cause gassiness or bloating.  Typical foods include beans and other legumes, cabbage, broccoli, and dairy foods.  Every person has some sensitivity to other foods, so listen to your body and avoid those foods that trigger problems for you. Adding fiber (Citrucel, Metamucil, FiberCon, Flax seed, Miralax) gradually can help thicken stools by absorbing excess fluid and retrain the intestines to act more normally.  Slowly increase the dose over a few weeks.  Too much fiber too soon can backfire and cause cramping & bloating. Probiotics (such as active yogurt, Align, etc) may help repopulate the intestines and colon  with normal bacteria and calm down a sensitive digestive tract.  Most studies show it to be of mild help, though, and such products can be costly. Medicines: Bismuth subsalicylate (ex. Kayopectate, Pepto Bismol) every 30 minutes for up to 6 doses can help  control diarrhea.  Avoid if pregnant. Loperamide (Immodium) can slow down diarrhea.  Start with two tablets (4mg  total) first and then try one tablet every 6 hours.  Avoid if you are having fevers or severe pain.  If you are not better or start feeling worse, stop all medicines and call your doctor for advice Call your doctor if you are getting worse or not better.  Sometimes further testing (cultures, endoscopy, X-ray studies, bloodwork, etc) may be needed to help diagnose and treat the cause of the diarrhea. TREATMENT OF HEMORRHOID FLARE If these preventive measures fail, you must take action right away! Hemorrhoids are one condition that can be mild in the morning and become intolerable by nightfall. Most hemorrhoidal flares take several weeks to calm down.  These suggestions can help: Warm soaks.  This helps more than any topical medication.  Use up to 8 times a day.  Usually sitz baths or sitting in a warm bathtub helps.  Sitting on moist warm towels are helpful.  Switching to ice packs/cool compresses can be helpful Normalize your bowels.  Extremes of diarrhea or constipation will make hemorrhoids worse.  One soft bowel movement a day is the goal.  Fiber can help get your bowels regular Wet wipes instead of toilet paper Pain control with a NSAID such as ibuprofen (Advil) or naproxen (Aleve) or acetaminophen (Tylenol) around the clock.  Narcotics are constipating and should be minimized if possible Topical creams contain steroids (bydrocortisone) or local anesthetic (xylocaine) can help make pain and itching more tolerable.   EVALUATION If hemorrhoids are still causing problems, you could benefit by an evaluation by a surgeon.  The surgeon will  obtain a history and examine you.  If hemorrhoids are diagnosed, some therapies can be offered in the office, usually with an anoscope into the less sensitive area of the rectum: -injection of hemorrhoids (sclerotherapy) can scar the blood vessels of the swollen/enlarged hemorrhoids to help shrink them down to a more normal size -rubber banding of the enlarged hemorrhoids to help shrink them down to a more normal size -drainage of the blood clot causing a thrombosed hemorrhoid,  to relieve the severe pain   While 90% of the time such problems from hemorrhoids can be managed without preceding to surgery, sometimes the hemorrhoids require a operation to control the problem (uncontrolled bleeding, prolapse, pain, etc.).   This involves being placed under general anesthesia where the surgeon can confirm the diagnosis and remove, suture, or staple the hemorrhoid(s).  Your surgeon can help you treat the problem appropriately.    GETTING TO GOOD BOWEL HEALTH. Irregular bowel habits such as constipation and diarrhea can lead to many problems over time.  Having one soft bowel movement a day is the most important way to prevent further problems.  The anorectal canal is designed to handle stretching and feces to safely manage our ability to get rid of solid waste (feces, poop, stool) out of our body.  BUT, hard constipated stools can act like ripping concrete bricks and diarrhea can be a burning fire to this very sensitive area of our body, causing inflamed hemorrhoids, anal fissures, increasing risk is perirectal abscesses, abdominal pain/bloating, an making irritable bowel worse.     The goal: ONE SOFT BOWEL MOVEMENT A DAY!  To have soft, regular bowel movements:    Drink at least 8 tall glasses of water a day.     Take plenty of fiber.  Fiber is the undigested part of plant food that  passes into the colon, acting s "natures broom" to encourage bowel motility and movement.  Fiber can absorb and hold large amounts  of water. This results in a larger, bulkier stool, which is soft and easier to pass. Work gradually over several weeks up to 6 servings a day of fiber (25g a day even more if needed) in the form of: o Vegetables -- Root (potatoes, carrots, turnips), leafy green (lettuce, salad greens, celery, spinach), or cooked high residue (cabbage, broccoli, etc) o Fruit -- Fresh (unpeeled skin & pulp), Dried (prunes, apricots, cherries, etc ),  or stewed ( applesauce)  o Whole grain breads, pasta, etc (whole wheat)  o Bran cereals    Bulking Agents -- This type of water-retaining fiber generally is easily obtained each day by one of the following:  o Psyllium bran -- The psyllium plant is remarkable because its ground seeds can retain so much water. This product is available as Metamucil, Konsyl, Effersyllium, Per Diem Fiber, or the less expensive generic preparation in drug and health food stores. Although labeled a laxative, it really is not a laxative.  o Methylcellulose -- This is another fiber derived from wood which also retains water. It is available as Citrucel. o Polyethylene Glycol - and "artificial" fiber commonly called Miralax or Glycolax.  It is helpful for people with gassy or bloated feelings with regular fiber o Flax Seed - a less gassy fiber than psyllium   No reading or other relaxing activity while on the toilet. If bowel movements take longer than 5 minutes, you are too constipated   AVOID CONSTIPATION.  High fiber and water intake usually takes care of this.  Sometimes a laxative is needed to stimulate more frequent bowel movements, but    Laxatives are not a good long-term solution as it can wear the colon out. o Osmotics (Milk of Magnesia, Fleets phosphosoda, Magnesium citrate, MiraLax, GoLytely) are safer than  o Stimulants (Senokot, Castor Oil, Dulcolax, Ex Lax)    o Do not take laxatives for more than 7days in a row.    IF SEVERELY CONSTIPATED, try a Bowel Retraining Program: o Do not  use laxatives.  o Eat a diet high in roughage, such as bran cereals and leafy vegetables.  o Drink six (6) ounces of prune or apricot juice each morning.  o Eat two (2) large servings of stewed fruit each day.  o Take one (1) heaping tablespoon of a psyllium-based bulking agent twice a day. Use sugar-free sweetener when possible to avoid excessive calories.  o Eat a normal breakfast.  o Set aside 15 minutes after breakfast to sit on the toilet, but do not strain to have a bowel movement.  o If you do not have a bowel movement by the third day, use an enema and repeat the above steps.    Controlling diarrhea o Switch to liquids and simpler foods for a few days to avoid stressing your intestines further. o Avoid dairy products (especially milk & ice cream) for a short time.  The intestines often can lose the ability to digest lactose when stressed. o Avoid foods that cause gassiness or bloating.  Typical foods include beans and other legumes, cabbage, broccoli, and dairy foods.  Every person has some sensitivity to other foods, so listen to our body and avoid those foods that trigger problems for you. o Adding fiber (Citrucel, Metamucil, psyllium, Miralax) gradually can help thicken stools by absorbing excess fluid and retrain the intestines to act more normally.  Slowly increase the dose over a few weeks.  Too much fiber too soon can backfire and cause cramping & bloating. o Probiotics (such as active yogurt, Align, etc) may help repopulate the intestines and colon with normal bacteria and calm down a sensitive digestive tract.  Most studies show it to be of mild help, though, and such products can be costly. o Medicines:   Bismuth subsalicylate (ex. Kayopectate, Pepto Bismol) every 30 minutes for up to 6 doses can help control diarrhea.  Avoid if pregnant.   Loperamide (Immodium) can slow down diarrhea.  Start with two tablets (4mg  total) first and then try one tablet every 6 hours.  Avoid if you are  having fevers or severe pain.  If you are not better or start feeling worse, stop all medicines and call your doctor for advice o Call your doctor if you are getting worse or not better.  Sometimes further testing (cultures, endoscopy, X-ray studies, bloodwork, etc) may be needed to help diagnose and treat the cause of the diarrhea.  Rectal Bleeding Rectal bleeding is when blood passes out of the anus. It is usually a sign that something is wrong. It may not be serious, but it should always be evaluated. Rectal bleeding may present as bright red blood or extremely dark stools. The color may range from dark red or maroon to black (like tar). It is important that the cause of rectal bleeding be identified so treatment can be started and the problem corrected. CAUSES   Hemorrhoids. These are enlarged (dilated) blood vessels or veins in the anal or rectal area.  Fistulas. Theseare abnormal, burrowing channels that usually run from inside the rectum to the skin around the anus. They can bleed.  Anal fissures. This is a tear in the tissue of the anus. Bleeding occurs with bowel movements.  Diverticulosis. This is a condition in which pockets or sacs project from the bowel wall. Occasionally, the sacs can bleed.  Diverticulitis. Thisis an infection involving diverticulosis of the colon.  Proctitis and colitis. These are conditions in which the rectum, colon, or both, can become inflamed and pitted (ulcerated).  Polyps and cancer. Polyps are non-cancerous (benign) growths in the colon that may bleed. Certain types of polyps turn into cancer.  Protrusion of the rectum. Part of the rectum can project from the anus and bleed.  Certain medicines.  Intestinal infections.  Blood vessel abnormalities. HOME CARE INSTRUCTIONS  Eat a high-fiber diet to keep your stool soft.  Limit activity.  Drink enough fluids to keep your urine clear or pale yellow.  Warm baths may be useful to soothe rectal  pain.  Follow up with your caregiver as directed. SEEK IMMEDIATE MEDICAL CARE IF:  You develop increased bleeding.  You have black or dark red stools.  You vomit blood or material that looks like coffee grounds.  You have abdominal pain or tenderness.  You have a fever.  You feel weak, nauseous, or you faint.  You have severe rectal pain or you are unable to have a bowel movement. MAKE SURE YOU:  Understand these instructions.  Will watch your condition.  Will get help right away if you are not doing well or get worse. Document Released: 08/21/2001 Document Revised: 05/24/2011 Document Reviewed: 08/16/2010 [facility] Patient Information 2014 [place], [place].

## 2012-12-05 NOTE — Progress Notes (Signed)
Subjective:     Patient ID: [name], female   DOB: 05/18/1929, 79 y.o.   MRN: [id number]  HPI  [name]  11-24-1929 [id number]  Patient Care Team: Jethro Bastos, MD as PCP - General (Family Medicine) Graylin Shiver, MD as Consulting Physician (Gastroenterology)  This patient is a 79 y.o.female who presents today for surgical evaluation at the request of Dr. Dorothe Pea.   Reason for visit: Persistent anemia and bleeding per rectum.  Question of hemorrhoids as the source.  Pleasant elderly woman.  She struggled with anemia.  Gets up bright red blood per rectum.  I think prior evaluations were concerning for diverticulosis.  Think she has had a colonoscopy in the past but cannot recall when or who.  Has had persistent bright red blood bleeding.  Therefore, sent to me to see hemorrhoids are the source of her bleeding.  She does have a history of atrial fibrillation.  She had been on warfarin.  Then on Plavix.  The last note this year notes she is off all his medications now.  She is being treated more palliatively and a little less aggressively.  That being said, she still takes 20 medications a day.  She complains of chronic knee and back pain.  She uses narcotics for this.  It has been an issue between family and the patient.  She claims to have a regular bowel movements with some small pellet-like bowel movements and then better well formed bowel movements.  I do not think she consistently takes a fiber bowel regimen.  She had an upper endoscopy done that was pretty normal.  This was in December 2013.  Patient Active Problem List   Diagnosis Date Noted  . Hemorrhoids, internal, with bleeding 12/05/2012  . Constipation, chronic 12/05/2012  . NSTEMI (non-ST elevated myocardial infarction) 09/09/2012  . Acute respiratory failure 09/08/2012  . Normocytic anemia 09/08/2012  . CKD (chronic kidney disease) stage 3, GFR 30-59 ml/min 09/08/2012  . Dementia in Alzheimer's disease,  mild 03/03/2012  . Normal coronary arteries 2009 and 2012 BUT new LVD with ant WMA this admsision. 03/03/2012  . BBB (bundle branch block) 03/01/2012  . Hyponatremia 02/29/2012  . Systolic CHF, acute on chronic (worsening LV function) EF of 20-25% on echo (12/16) 02/29/2012  . Ischemic cardiomyopathy 02/29/2012  . History of atrial fibrillation 02/29/2012  . Acute exacerbation of chronic obstructive pulmonary disease (COPD) 02/29/2012  . Microcytic anemia, endoscopy negative for PUD 03/01/12 02/28/2012  . 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 DM 01/09/2009  . HYPERLIPIDEMIA 01/09/2009  . HYPERTENSION 01/09/2009  . Combined systolic and diastolic heart failure 01/09/2009  . ALLERGIC RHINITIS 01/09/2009  . DYSPNEA 01/09/2009    Past Medical History  Diagnosis Date  . Alzheimer's disease   . 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 Chronic kidney disease, stage III (moderate)   . Unspecified vitamin D deficiency   . Arthritis     osteo  . Hypertension   . Hyperlipidemia   . Depression   . GERD (gastroesophageal reflux disease)   . Cataract   . Exudative senile macular degeneration of retina   . Retinal neovascularization NOS   . Chronic combined systolic and diastolic heart failure   . Non-ischemic cardiomyopathy     EF 20-25% 2D 12/13  . PAF (paroxysmal atrial fibrillation)   . Pure hypercholesterolemia   . Esophageal reflux   . COPD (chronic obstructive pulmonary disease)   . Primary localized osteoarthrosis, lower leg   .  Lumbago   . Vitamin D deficiency   . Macular degeneration   . Chronic pain     back  . Aortic stenosis, mild     echo 12/13  . Normal coronary arteries 10/09, 6/12  . LBBB (left bundle branch block)   . Microcytic anemia, endoscopy negative for PUD 03/01/12 02/28/2012    Past Surgical History  Procedure Laterality Date  . Knee surgery  1997  . Appendectomy  1949  . Tonsillectomy    . Esophagogastroduodenoscopy  03/01/2012     Procedure: ESOPHAGOGASTRODUODENOSCOPY (EGD);  Surgeon: Graylin Shiver, MD;  Location: [facility] ENDOSCOPY;  Service: Endoscopy;  Laterality: N/A;  . Abdominal hysterectomy  1970    History   Social History  . Marital Status: Widowed    Spouse Name: N/A    Number of Children: N/A  . Years of Education: N/A   Occupational History  . Not on file.   Social History Main Topics  . Smoking status: Former Smoker    Quit date: 05/27/2007  . Smokeless tobacco: Never Used  . Alcohol Use: No  . Drug Use: No  . Sexual Activity: Not on file   Other Topics Concern  . Not on file   Social History Narrative  . No narrative on file    Family History  Problem Relation Age of Onset  . Stroke Mother   . Heart disease Father   . Cancer Brother     Current Outpatient Prescriptions  Medication Sig Dispense Refill  . acetaminophen (TYLENOL) 325 MG tablet Take 650 mg by mouth every 4 (four) hours as needed.      Marland Kitchen albuterol (PROAIR HFA) 108 (90 BASE) MCG/ACT inhaler Inhale 2 puffs into the lungs every 4 (four) hours as needed.  6.7 g  3  . aspirin EC 81 MG EC tablet Take 1 tablet (81 mg total) by mouth daily.  90 tablet  2  . Calcium Carbonate (CALTRATE 600 PO) Take 1 tablet by mouth daily.       . carvedilol (COREG) 6.25 MG tablet Take 1 tablet (6.25 mg total) by mouth 2 (two) times daily with a meal.  60 tablet  2  . cholecalciferol (VITAMIN D) 1000 UNITS tablet Take 1,000 Units by mouth daily.      . citalopram (CELEXA) 20 MG tablet Take 20 mg by mouth daily.      . clopidogrel (PLAVIX) 75 MG tablet Take 1 tablet (75 mg total) by mouth daily after breakfast.  30 tablet  0  . ferrous sulfate 325 (65 FE) MG tablet Take 1 tablet (325 mg total) by mouth 2 (two) times daily with a meal.  60 tablet  2  . furosemide (LASIX) 40 MG tablet Take 1 tablet (40 mg total) by mouth 2 (two) times daily.  60 tablet  1  . HYDROcodone-acetaminophen (VICODIN) 5-500 MG per tablet Take 1 tablet by mouth every 6 (six)  hours. For pain      . ipratropium (ATROVENT) 0.02 % nebulizer solution Take 2.5 mLs (0.5 mg total) by nebulization 3 (three) times daily.  75 mL  12  . isosorbide mononitrate (IMDUR) 30 MG 24 hr tablet Take 30 mg by mouth daily.      Marland Kitchen lisinopril (PRINIVIL,ZESTRIL) 10 MG tablet Take 10 mg by mouth daily.      . Loperamide HCl (IMODIUM PO) Take 1 tablet by mouth daily as needed (loose stools).       Marland Kitchen lovastatin (MEVACOR) 40 MG tablet  Take 40 mg by mouth at bedtime.      . Multiple Vitamins-Minerals (OCUVITE PO) Take 1 tablet by mouth daily.       . mupirocin ointment (BACTROBAN) 2 % Apply 1 application topically 2 (two) times daily.  22 g    . nystatin cream (MYCOSTATIN) Apply 1 application topically daily as needed. For rash      . omeprazole (PRILOSEC) 20 MG capsule Take 20 mg by mouth daily.      . potassium chloride (K-DUR) 10 MEQ tablet Take 20 mEq by mouth 2 (two) times daily.      Marland Kitchen spironolactone (ALDACTONE) 25 MG tablet Take 1 tablet (25 mg total) by mouth daily.  30 tablet  1  . traZODone (DESYREL) 50 MG tablet Take 1 tablet (50 mg total) by mouth at bedtime as needed for sleep.  30 tablet  0  . triamcinolone cream (KENALOG) 0.5 % Apply topically 3 (three) times daily.       No current facility-administered medications for this visit.     Allergies  Allergen Reactions  . Darvon [Propoxyphene Hcl] Nausea Only  . Paroxetine Other (See Comments)    "makes me crazy" out of body experience    BP 160/78  Pulse 76  Temp(Src) 97.8 F (36.6 C) (Temporal)  Resp 18  Ht 5\' 1"  (1.549 m)  Wt 155 lb (70.308 kg)  BMI 29.3 kg/m2  No results found.   Review of Systems  Constitutional: Negative for fever, chills, diaphoresis, appetite change and fatigue.  HENT: Positive for hearing loss. Negative for ear pain, sore throat, trouble swallowing, neck pain and ear discharge.   Eyes: Negative for photophobia and discharge.  Respiratory: Negative for cough, choking, chest tightness and  shortness of breath.   Cardiovascular: Negative for chest pain and palpitations.  Gastrointestinal: Positive for constipation and anal bleeding. Negative for nausea, vomiting, abdominal pain, diarrhea and rectal pain.  Endocrine: Negative for cold intolerance and heat intolerance.  Genitourinary: Negative for dysuria, frequency and difficulty urinating.  Musculoskeletal: Positive for myalgias, back pain and arthralgias. Negative for gait problem.  Skin: Negative for color change, pallor and rash.  Allergic/Immunologic: Negative for environmental allergies, food allergies and immunocompromised state.  Neurological: Positive for weakness. Negative for dizziness, syncope, speech difficulty and numbness.  Hematological: Negative for adenopathy.  Psychiatric/Behavioral: Negative for confusion and agitation. The patient is not nervous/anxious.        Objective:   Physical Exam  Constitutional: She is oriented to person, place, and time. She appears well-developed and well-nourished. No distress.  HENT:  Head: Normocephalic.  Mouth/Throat: Oropharynx is clear and moist. No oropharyngeal exudate.  Eyes: Conjunctivae and EOM are normal. Pupils are equal, round, and reactive to light. No scleral icterus.  Neck: Normal range of motion. Neck supple. No tracheal deviation present.  Cardiovascular: Normal rate, regular rhythm and intact distal pulses.   Pulmonary/Chest: Effort normal and breath sounds normal. No stridor. No respiratory distress. She exhibits no tenderness.  Abdominal: Soft. She exhibits no distension and no mass. There is no tenderness. Hernia confirmed negative in the right inguinal area and confirmed negative in the left inguinal area.  Genitourinary: No vaginal discharge found.  Exam done with assistance of female Medical Assistant in the room.  Perianal skin clean with good hygiene.  No pruritis.  No pilonidal disease.  No fissure.  No abscess/fistula.  No external skin tags /  hemorrhoids of significance.  Tolerates digital and anoscopic rectal exam.  Normal sphincter  tone.  No rectal masses.    Hemorrhoidal piles enlarged and irrirarted.  Right posterior partially prolapses > left lateral = right anterior  Musculoskeletal: Normal range of motion. She exhibits no tenderness.       Right elbow: She exhibits normal range of motion.       Left elbow: She exhibits normal range of motion.       Right wrist: She exhibits normal range of motion.       Left wrist: She exhibits normal range of motion.       Right hand: Normal strength noted.       Left hand: Normal strength noted.  Lymphadenopathy:       Head (right side): No posterior auricular adenopathy present.       Head (left side): No posterior auricular adenopathy present.    She has no cervical adenopathy.    She has no axillary adenopathy.       Right: No inguinal adenopathy present.       Left: No inguinal adenopathy present.  Neurological: She is alert and oriented to person, place, and time. No cranial nerve deficit. She exhibits normal muscle tone. Coordination normal.  Skin: Skin is warm and dry. No rash noted. She is not diaphoretic. No erythema.  Psychiatric: She has a normal mood and affect. Her behavior is normal. Judgment and thought content normal.       Assessment:     Moderate rectal bleeding of uncertain source.  Negative upper endoscopy   Moderate internal hemorrhoids    Plan:     I went ahead and banded internal hemorrhoids.  I did all three piles.  She tolerated it well:  The anatomy & physiology of the anorectal region was discussed.  The pathophysiology of hemorrhoids and differential diagnosis was discussed.  Natural history progression  was discussed.   I stressed the importance of a bowel regimen to have daily soft bowel movements to minimize progression of disease.     The patient's symptoms are not adequately controlled.  Therefore, I recommended banding to treat the hemorrhoids.   I went over the technique, risks, benefits, and alternatives.   Goals of post-operative recovery were discussed as well.  Questions were answered.  The patient expressed understanding & wished to proceed.  The patient was positioned in the lateral decubitus position.  Perianal & rectal examination was done.  Using anoscopy, I ligated all 3 piles of hemorrhoids above the dentate line with banding.  The patient tolerated the procedure well.  Educational handouts further explaining the pathology, treatment options, and bowel regimen were given as well.    Not surprisingly, she asked for some stronger narcotics.  This seems to be a recurring team and all the nodes have seen before.  Because she is not any pain right now, I hesitate to add a new narcotic per her usual regimen, especially since they are trying to wean her off/minimize that.  She understood.  I discussed nonnarcotic pain control such as Tylenol and warm soaks.  She expressed understanding.  She walked down the hallway without problems.

## 2012-12-07 ENCOUNTER — Encounter (INDEPENDENT_AMBULATORY_CARE_PROVIDER_SITE_OTHER): Payer: Self-pay

## 2012-12-18 ENCOUNTER — Ambulatory Visit (HOSPITAL_BASED_OUTPATIENT_CLINIC_OR_DEPARTMENT_OTHER): Payer: Medicare (Managed Care) | Admitting: Hematology and Oncology

## 2012-12-18 ENCOUNTER — Other Ambulatory Visit: Payer: Self-pay | Admitting: Hematology and Oncology

## 2012-12-18 ENCOUNTER — Ambulatory Visit: Payer: Medicare (Managed Care)

## 2012-12-18 ENCOUNTER — Telehealth: Payer: Self-pay | Admitting: Hematology and Oncology

## 2012-12-18 ENCOUNTER — Ambulatory Visit: Payer: Medicare (Managed Care) | Admitting: Lab

## 2012-12-18 ENCOUNTER — Other Ambulatory Visit (HOSPITAL_COMMUNITY)
Admission: RE | Admit: 2012-12-18 | Discharge: 2012-12-18 | Disposition: A | Discharge status: Home / Self Care | Payer: Medicare (Managed Care) | Source: Ambulatory Visit | Attending: Hematology and Oncology | Admitting: Hematology and Oncology

## 2012-12-18 ENCOUNTER — Ambulatory Visit (HOSPITAL_COMMUNITY)
Admission: RE | Admit: 2012-12-18 | Discharge: 2012-12-18 | Disposition: A | Discharge status: Home / Self Care | Payer: Medicare (Managed Care) | Source: Ambulatory Visit | Attending: Hematology and Oncology | Admitting: Hematology and Oncology

## 2012-12-18 ENCOUNTER — Other Ambulatory Visit: Payer: Self-pay | Admitting: *Deleted

## 2012-12-18 ENCOUNTER — Encounter: Payer: Self-pay | Admitting: Hematology and Oncology

## 2012-12-18 VITALS — BP 164/79 | HR 103 | Temp 97.6°F | Resp 20 | Ht 61.0 in | Wt 148.9 lb

## 2012-12-18 DIAGNOSIS — K922 Gastrointestinal hemorrhage, unspecified: Secondary | ICD-10-CM

## 2012-12-18 DIAGNOSIS — K648 Other hemorrhoids: Secondary | ICD-10-CM

## 2012-12-18 DIAGNOSIS — D649 Anemia, unspecified: Secondary | ICD-10-CM | POA: Insufficient documentation

## 2012-12-18 DIAGNOSIS — R0602 Shortness of breath: Secondary | ICD-10-CM

## 2012-12-18 DIAGNOSIS — R0989 Other specified symptoms and signs involving the circulatory and respiratory systems: Secondary | ICD-10-CM | POA: Insufficient documentation

## 2012-12-18 DIAGNOSIS — R0609 Other forms of dyspnea: Secondary | ICD-10-CM | POA: Insufficient documentation

## 2012-12-18 DIAGNOSIS — K649 Unspecified hemorrhoids: Secondary | ICD-10-CM

## 2012-12-18 DIAGNOSIS — J449 Chronic obstructive pulmonary disease, unspecified: Secondary | ICD-10-CM

## 2012-12-18 DIAGNOSIS — K7460 Unspecified cirrhosis of liver: Secondary | ICD-10-CM

## 2012-12-18 DIAGNOSIS — D696 Thrombocytopenia, unspecified: Secondary | ICD-10-CM

## 2012-12-18 LAB — IRON AND TIBC CHCC
Iron: 25 ug/dL — ABNORMAL LOW (ref 41–142)
TIBC: 251 ug/dL (ref 236–444)

## 2012-12-18 LAB — TSH CHCC: TSH: 0.81 m(IU)/L (ref 0.308–3.960)

## 2012-12-18 LAB — COMPREHENSIVE METABOLIC PANEL (CC13)
AST: 26 U/L (ref 5–34)
Albumin: 3 g/dL — ABNORMAL LOW (ref 3.5–5.0)
Alkaline Phosphatase: 97 U/L (ref 40–150)
BUN: 34.7 mg/dL — ABNORMAL HIGH (ref 7.0–26.0)
Glucose: 175 mg/dl — ABNORMAL HIGH (ref 70–140)
Sodium: 141 mEq/L (ref 136–145)
Total Bilirubin: 1.07 mg/dL (ref 0.20–1.20)

## 2012-12-18 LAB — CBC & DIFF AND RETIC
BASO%: 0.2 % (ref 0.0–2.0)
EOS%: 2.4 % (ref 0.0–7.0)
HCT: 23.9 % — ABNORMAL LOW (ref 34.8–46.6)
Immature Retic Fract: 16.1 % — ABNORMAL HIGH (ref 1.60–10.00)
LYMPH%: 5.3 % — ABNORMAL LOW (ref 14.0–49.7)
MCH: 30.7 pg (ref 25.1–34.0)
MCHC: 33.1 g/dL (ref 31.5–36.0)
MCV: 93 fL (ref 79.5–101.0)
MONO%: 13.8 % (ref 0.0–14.0)
NEUT%: 78.3 % — ABNORMAL HIGH (ref 38.4–76.8)
Platelets: 104 10*3/uL — ABNORMAL LOW (ref 145–400)
RBC: 2.57 10*6/uL — ABNORMAL LOW (ref 3.70–5.45)
WBC: 5.9 10*3/uL (ref 3.9–10.3)
nRBC: 0 % (ref 0–0)

## 2012-12-18 LAB — PREPARE RBC (CROSSMATCH)

## 2012-12-18 LAB — LACTATE DEHYDROGENASE (CC13): LDH: 235 U/L (ref 125–245)

## 2012-12-18 LAB — FERRITIN CHCC: Ferritin: 71 ng/ml (ref 9–269)

## 2012-12-18 NOTE — Telephone Encounter (Signed)
gv and printed appt sched and avs for pt for OCT...sent pt back to the lab

## 2012-12-18 NOTE — Progress Notes (Signed)
[facility] CONSULT NOTE CHIEF COMPLAINTS/PURPOSE OF CONSULTATION: Pancytopenia, blood transfusion dependent, chronic lower GI bleed, for further management  HISTORY OF PRESENTING ILLNESS:  [name] 79 y.o. female is a very pleasant lady who is being referred here because of chronic anemia. The patient had received numerous blood transfusions and IV iron data back at least 2 2012. She has ongoing GI blood loss secondary to severe hemorrhoids and according to the patient, recently went to a surgeon's office to have a banded. The patient noted significant amount of fresh blood in her hemorrhoid area on a daily basis, exacerbated by bowel movements. She also had occasional, rare epistaxis. The patient cannot remember whether she is on blood thinners for her cardiac condition. To her knowledge, that has been discontinue although on review of her medication list today, it is still listed as she's taking aspirin and Plavix. She is complaining also of chronic shortness of breath and wheezing. She does not use oxygen at home right now. She carry a diagnosis of severe COPD due to chronic smoking history. She denies recent smoking. She complained of fatigue but denies any recent chest pain, dizziness, or leg cramps.  MEDICAL HISTORY:  Past Medical History  Diagnosis Date  . Alzheimer's disease   . 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 Chronic kidney disease, stage III (moderate)   . Unspecified vitamin D deficiency   . Arthritis     osteo  . Hypertension   . Hyperlipidemia   . Depression   . GERD (gastroesophageal reflux disease)   . Cataract   . Exudative senile macular degeneration of retina   . Retinal neovascularization NOS   . Chronic combined systolic and diastolic heart failure   . Non-ischemic cardiomyopathy     EF 20-25% 2D 12/13  . PAF (paroxysmal atrial fibrillation)   . Pure hypercholesterolemia   . Esophageal reflux   . COPD (chronic obstructive  pulmonary disease)   . Primary localized osteoarthrosis, lower leg   . Lumbago   . Vitamin D deficiency   . Macular degeneration   . Chronic pain     back  . Aortic stenosis, mild     echo 12/13  . Normal coronary arteries 10/09, 6/12  . LBBB (left bundle branch block)   . Microcytic anemia, endoscopy negative for PUD 03/01/12 02/28/2012    SURGICAL HISTORY: Past Surgical History  Procedure Laterality Date  . Knee surgery  1997  . Appendectomy  1949  . Tonsillectomy    . Esophagogastroduodenoscopy  03/01/2012    Procedure: ESOPHAGOGASTRODUODENOSCOPY (EGD);  Surgeon: Graylin Shiver, MD;  Location: [facility] ENDOSCOPY;  Service: Endoscopy;  Laterality: N/A;  . Abdominal hysterectomy  1970    SOCIAL HISTORY: History   Social History  . Marital Status: Widowed    Spouse Name: N/A    Number of Children: N/A  . Years of Education: N/A   Occupational History  . Not on file.   Social History Main Topics  . Smoking status: Former Smoker    Quit date: 05/27/2007  . Smokeless tobacco: Never Used  . Alcohol Use: No  . Drug Use: No  . Sexual Activity: Not on file   Other Topics Concern  . Not on file   Social History Narrative  . No narrative on file    FAMILY HISTORY: Family History  Problem Relation Age of Onset  . Stroke Mother   . Heart disease Father   . Cancer Brother  ALLERGIES:  is allergic to darvon and paroxetine.  MEDICATIONS: Current outpatient prescriptions:acetaminophen (TYLENOL) 325 MG tablet, Take 650 mg by mouth every 4 (four) hours as needed., Disp: , Rfl: ;  albuterol (PROAIR HFA) 108 (90 BASE) MCG/ACT inhaler, Inhale 2 puffs into the lungs every 4 (four) hours as needed., Disp: 6.7 g, Rfl: 3;  aspirin EC 81 MG EC tablet, Take 1 tablet (81 mg total) by mouth daily., Disp: 90 tablet, Rfl: 2 Calcium Carbonate (CALTRATE 600 PO), Take 1 tablet by mouth daily. , Disp: , Rfl: ;  carvedilol (COREG) 6.25 MG tablet, Take 1 tablet (6.25 mg total) by mouth 2 (two)  times daily with a meal., Disp: 60 tablet, Rfl: 2;  cholecalciferol (VITAMIN D) 1000 UNITS tablet, Take 1,000 Units by mouth daily., Disp: , Rfl: ;  citalopram (CELEXA) 20 MG tablet, Take 20 mg by mouth daily., Disp: , Rfl:  clopidogrel (PLAVIX) 75 MG tablet, Take 1 tablet (75 mg total) by mouth daily after breakfast., Disp: 30 tablet, Rfl: 0;  ferrous sulfate 325 (65 FE) MG tablet, Take 1 tablet (325 mg total) by mouth 2 (two) times daily with a meal., Disp: 60 tablet, Rfl: 2;  furosemide (LASIX) 40 MG tablet, Take 1 tablet (40 mg total) by mouth 2 (two) times daily., Disp: 60 tablet, Rfl: 1 HYDROcodone-acetaminophen (VICODIN) 5-500 MG per tablet, Take 1 tablet by mouth every 6 (six) hours. For pain, Disp: , Rfl: ;  ipratropium (ATROVENT) 0.02 % nebulizer solution, Take 2.5 mLs (0.5 mg total) by nebulization 3 (three) times daily., Disp: 75 mL, Rfl: 12;  isosorbide mononitrate (IMDUR) 30 MG 24 hr tablet, Take 30 mg by mouth daily., Disp: , Rfl: ;  lisinopril (PRINIVIL,ZESTRIL) 10 MG tablet, Take 10 mg by mouth daily., Disp: , Rfl:  Loperamide HCl (IMODIUM PO), Take 1 tablet by mouth daily as needed (loose stools). , Disp: , Rfl: ;  lovastatin (MEVACOR) 40 MG tablet, Take 40 mg by mouth at bedtime., Disp: , Rfl: ;  Multiple Vitamins-Minerals (OCUVITE PO), Take 1 tablet by mouth daily. , Disp: , Rfl: ;  mupirocin ointment (BACTROBAN) 2 %, Apply 1 application topically 2 (two) times daily., Disp: 22 g, Rfl:  nystatin cream (MYCOSTATIN), Apply 1 application topically daily as needed. For rash, Disp: , Rfl: ;  omeprazole (PRILOSEC) 20 MG capsule, Take 20 mg by mouth daily., Disp: , Rfl: ;  potassium chloride (K-DUR) 10 MEQ tablet, Take 20 mEq by mouth 2 (two) times daily., Disp: , Rfl: ;  spironolactone (ALDACTONE) 25 MG tablet, Take 1 tablet (25 mg total) by mouth daily., Disp: 30 tablet, Rfl: 1 traZODone (DESYREL) 50 MG tablet, Take 1 tablet (50 mg total) by mouth at bedtime as needed for sleep., Disp: 30 tablet,  Rfl: 0;  triamcinolone cream (KENALOG) 0.5 %, Apply topically 3 (three) times daily., Disp: , Rfl:   REVIEW OF SYSTEMS:   Constitutional: Denies fevers, chills or abnormal weight loss Eyes: Denies blurriness of vision, double vision or watery eyes Ears, nose, mouth, throat, and face: Denies mucositis or sore throat Respiratory: She has chronic shortness of breath and wheezing this Cardiovascular: Denies palpitation, chest discomfort or lower extremity swelling Gastrointestinal:  Denies nausea, heartburn or change in bowel habits Skin: Denies abnormal skin rashes Lymphatics: Denies new lymphadenopathy or easy bruising Neurological:Denies numbness, tingling or new weaknesses Behavioral/Psych: Mood is stable, no new changes  All other systems were reviewed with the patient and are negative.  PHYSICAL EXAMINATION: ECOG PERFORMANCE STATUS: 1 -  Symptomatic but completely ambulatory  Filed Vitals:   12/18/12 1058  BP: 164/79  Pulse: 103  Temp: 97.6 F (36.4 C)  Resp: 20   Filed Weights   12/18/12 1058  Weight: 148 lb 14.4 oz (67.541 kg)    GENERAL:alert, no distress and comfortable patient looks elderly. She does have mild tachypnea at rest SKIN: skin color, texture, turgor are normal, no rashes or significant lesions EYES: normal, conjunctiva are pink and non-injected, sclera clear OROPHARYNX:no exudate, no erythema and lips, buccal mucosa, and tongue normal  NECK: supple, thyroid normal size, non-tender, without nodularity LYMPH:  no palpable lymphadenopathy in the cervical, axillary or inguinal LUNGS: Increased breathing effort is noted along with profound expiratory wheeze at rest HEART: Irregular rate and rhythm, with soft systolic murmur on the left sternal border  ABDOMEN:abdomen soft, she has mild tenderness on palpation on the right upper quadrant associated with very mild probable liver edge Musculoskeletal:no cyanosis of digits and no clubbing  PSYCH: alert & oriented x 3  with fluent speech NEURO: no focal motor/sensory deficits Rectal bleeding review significant hemorrhoids and presence of fresh blood  LABORATORY DATA:  I have reviewed the data as listed Recent Results (from the past 2160 hour(s))  TYPE AND SCREEN     Status: None   Collection Time    10/02/12  1:40 PM      Result Value Range   ABO/RH(D) A POS     Antibody Screen NEG     Sample Expiration 10/05/2012     Unit Number [id number]     Blood Component Type RED CELLS,LR     Unit division 00     Status of Unit ISSUED,FINAL     Transfusion Status OK TO TRANSFUSE     Crossmatch Result Compatible    PREPARE RBC (CROSSMATCH)     Status: None   Collection Time    10/02/12  1:40 PM      Result Value Range   Order Confirmation ORDER PROCESSED BY BLOOD BANK    TYPE AND SCREEN     Status: None   Collection Time    10/06/12  3:00 PM      Result Value Range   ABO/RH(D) A POS     Antibody Screen NEG     Sample Expiration 10/09/2012     Unit Number [id number]     Blood Component Type RED CELLS,LR     Unit division 00     Status of Unit ISSUED,FINAL     Transfusion Status OK TO TRANSFUSE     Crossmatch Result Compatible    PREPARE RBC (CROSSMATCH)     Status: None   Collection Time    10/06/12  3:00 PM      Result Value Range   Order Confirmation ORDER PROCESSED BY BLOOD BANK    TYPE AND SCREEN     Status: None   Collection Time    10/30/12 10:35 AM      Result Value Range   ABO/RH(D) A POS     Antibody Screen NEG     Sample Expiration 11/02/2012     Unit Number [id number]     Blood Component Type RED CELLS,LR     Unit division 00     Status of Unit ISSUED,FINAL     Transfusion Status OK TO TRANSFUSE     Crossmatch Result Compatible    PREPARE RBC (CROSSMATCH)     Status: None   Collection Time    10/30/12 10:35  AM      Result Value Range   Order Confirmation ORDER PROCESSED BY BLOOD BANK    TYPE AND SCREEN     Status: None   Collection Time    11/06/12 11:20 AM       Result Value Range   ABO/RH(D) A POS     Antibody Screen NEG     Sample Expiration 11/09/2012     Unit Number [id number]     Blood Component Type RED CELLS,LR     Unit division 00     Status of Unit ISSUED,FINAL     Transfusion Status OK TO TRANSFUSE     Crossmatch Result Compatible    PREPARE RBC (CROSSMATCH)     Status: None   Collection Time    11/06/12 11:20 AM      Result Value Range   Order Confirmation ORDER PROCESSED BY BLOOD BANK     The most recent scan blood work show anemia as well as thrombocytopenia RADIOGRAPHIC STUDIES: I reviewed her most recent CT scan dated several years ago which showed nodular liver appearance compatible with possible liver cirrhosis ASSESSMENT:  Anemia and thrombocytopenia, on possible background history liver cirrhosis  PLAN:  #1 chronic anemia This is likely related to combined iron deficiency and anemia of chronic disease. She received numerous units of blood transfusion and IV iron. She has ongoing GI blood loss. Each unit of blood work will buy her a short period of time. I am not certain that her iron store is completely replenished before she and her into an observation mode. Certainly myelodysplastic syndrome cannot be rule out especially she has thrombocytopenia at present time. I will start with some basic blood work. If there are evidence to suggest myelodysplastic syndrome, we will proceed with a bone marrow aspirate and biopsy #2 chronic thrombocytopenia This could be due to liver services. Also liver so cirrhosis is unknown but could be due to chronic liver congestion from her history of cardiomyopathy. I will order some coagulation study for further evaluation. Given her ongoing GI bleed, I recommended she discontinue all her blood thinners #3 possible liver cirrhosis Should have evidence of thrombocytopenia and nodular liver appearance from her most recent CT scan. The cause of liver cirrhosis is unknown but could be due to chronic  congestion from chronic heart failure. Echo certainly exacerbate her bleeding problems. Her which has some coagulation study #4 chronic GI bleed This could be due to an anatomical problem or bleeding related to her liver cirrhosis. We will observe for now. Apparently she has exhausted all options available to stop this bleeding and due to her background history she's not a candidate for a partial colectomy. We should minimize using any from a blood thinner for now. I will attempt to see her next week to review all test results and will treat accordingly. All questions were answered. The patient knows to call the clinic with any problems, questions or concerns. I can certainly see the patient much sooner if necessary. No barriers to learning was detected.  I spent 25 minutes counseling the patient face to face. The total time spent in the appointment was 40 minutes and more than 50% was on counseling.     [facility], Lyfe Reihl, MD 12/18/2012 12:26 PM

## 2012-12-19 ENCOUNTER — Telehealth: Payer: Self-pay | Admitting: *Deleted

## 2012-12-19 LAB — HOLD TUBE, BLOOD BANK

## 2012-12-19 NOTE — Telephone Encounter (Signed)
Called [facility] to arrange patient's transportation for blood transfusion on 10/8 at 11:30.

## 2012-12-19 NOTE — Telephone Encounter (Signed)
Attempted to call patient regarding transfusion appointment tomorrow at 11:30. Pt's son said we need to call [facility] to arrange transportation

## 2012-12-20 ENCOUNTER — Telehealth: Payer: Self-pay | Admitting: Medical Oncology

## 2012-12-20 NOTE — Telephone Encounter (Signed)
NP called to verify pt appts.

## 2012-12-21 ENCOUNTER — Other Ambulatory Visit: Payer: Self-pay | Admitting: Hematology and Oncology

## 2012-12-21 ENCOUNTER — Ambulatory Visit: Payer: Medicare (Managed Care)

## 2012-12-21 VITALS — BP 140/67 | HR 77 | Temp 97.7°F | Resp 20

## 2012-12-21 DIAGNOSIS — D649 Anemia, unspecified: Secondary | ICD-10-CM

## 2012-12-21 MED ORDER — SODIUM CHLORIDE 0.9 % IV SOLN: 250.0000 mL | Freq: Once | INTRAVENOUS | Status: DC

## 2012-12-21 NOTE — Patient Instructions (Signed)
Blood Transfusion Information WHAT IS A BLOOD TRANSFUSION? A transfusion is the replacement of blood or some of its parts. Blood is made up of multiple cells which provide different functions.  Red blood cells carry oxygen and are used for blood loss replacement.  White blood cells fight against infection.  Platelets control bleeding.  Plasma helps clot blood.  Other blood products are available for specialized needs, such as hemophilia or other clotting disorders. BEFORE THE TRANSFUSION  Who gives blood for transfusions?   You may be able to donate blood to be used at a later date on yourself (autologous donation).  Relatives can be asked to donate blood. This is generally not any safer than if you have received blood from a stranger. The same precautions are taken to ensure safety when a relative's blood is donated.  Healthy volunteers who are fully evaluated to make sure their blood is safe. This is blood bank blood. Transfusion therapy is the safest it has ever been in the practice of medicine. Before blood is taken from a donor, a complete history is taken to make sure that person has no history of diseases nor engages in risky social behavior (examples are intravenous drug use or sexual activity with multiple partners). The donor's travel history is screened to minimize risk of transmitting infections, such as malaria. The donated blood is tested for signs of infectious diseases, such as HIV and hepatitis. The blood is then tested to be sure it is compatible with you in order to minimize the chance of a transfusion reaction. If you or a relative donates blood, this is often done in anticipation of surgery and is not appropriate for emergency situations. It takes many days to process the donated blood. RISKS AND COMPLICATIONS Although transfusion therapy is very safe and saves many lives, the main dangers of transfusion include:   Getting an infectious disease.  Developing a  transfusion reaction. This is an allergic reaction to something in the blood you were given. Every precaution is taken to prevent this. The decision to have a blood transfusion has been considered carefully by your caregiver before blood is given. Blood is not given unless the benefits outweigh the risks. AFTER THE TRANSFUSION  Right after receiving a blood transfusion, you will usually feel much better and more energetic. This is especially true if your red blood cells have gotten low (anemic). The transfusion raises the level of the red blood cells which carry oxygen, and this usually causes an energy increase.  The nurse administering the transfusion will monitor you carefully for complications. HOME CARE INSTRUCTIONS  No special instructions are needed after a transfusion. You may find your energy is better. Speak with your caregiver about any limitations on activity for underlying diseases you may have. SEEK MEDICAL CARE IF:   Your condition is not improving after your transfusion.  You develop redness or irritation at the intravenous (IV) site. SEEK IMMEDIATE MEDICAL CARE IF:  Any of the following symptoms occur over the next 12 hours:  Shaking chills.  You have a temperature by mouth above 102 F (38.9 C), not controlled by medicine.  Chest, back, or muscle pain.  People around you feel you are not acting correctly or are confused.  Shortness of breath or difficulty breathing.  Dizziness and fainting.  You get a rash or develop hives.  You have a decrease in urine output.  Your urine turns a dark color or changes to pink, red, or brown. Any of the following   symptoms occur over the next 10 days:  You have a temperature by mouth above 102 F (38.9 C), not controlled by medicine.  Shortness of breath.  Weakness after normal activity.  The white part of the eye turns yellow (jaundice).  You have a decrease in the amount of urine or are urinating less often.  Your  urine turns a dark color or changes to pink, red, or brown. Document Released: 02/27/2000 Document Revised: 05/24/2011 Document Reviewed: 10/16/2007 [employer] Patient Information 2014 [employer].  

## 2012-12-22 LAB — TYPE AND SCREEN
Antibody Screen: NEGATIVE
Unit division: 0

## 2012-12-22 LAB — FLOW CYTOMETRY

## 2012-12-24 ENCOUNTER — Other Ambulatory Visit: Payer: Self-pay | Admitting: Hematology and Oncology

## 2012-12-26 ENCOUNTER — Ambulatory Visit: Payer: Medicare (Managed Care) | Admitting: Hematology and Oncology

## 2012-12-26 ENCOUNTER — Telehealth: Payer: Self-pay | Admitting: Hematology and Oncology

## 2012-12-26 NOTE — Telephone Encounter (Signed)
pt Dr. office called to cx appt for pt is sick.Marland Kitchendone r/s appt and MD office will call pt

## 2012-12-27 LAB — SPEP & IFE WITH QIG
Alpha-1-Globulin: 8.4 % — ABNORMAL HIGH (ref 2.9–4.9)
Alpha-2-Globulin: 14.8 % — ABNORMAL HIGH (ref 7.1–11.8)
Beta Globulin: 5.9 % (ref 4.7–7.2)
Gamma Globulin: 9.6 % — ABNORMAL LOW (ref 11.1–18.8)
IgA: 225 mg/dL (ref 69–380)
IgM, Serum: 45 mg/dL — ABNORMAL LOW (ref 52–322)
Total Protein, Serum Electrophoresis: 5.7 g/dL — ABNORMAL LOW (ref 6.0–8.3)

## 2012-12-27 LAB — PNH PROFILE (-HIGH SENSITIVITY): Viability (%): 80 %

## 2012-12-27 LAB — APTT: aPTT: 42 seconds — ABNORMAL HIGH (ref 24–37)

## 2012-12-27 LAB — SEDIMENTATION RATE: Sed Rate: 77 mm/hr — ABNORMAL HIGH (ref 0–22)

## 2012-12-27 LAB — DIRECT ANTIGLOBULIN TEST (NOT AT ARMC)
DAT (Complement): NEGATIVE
DAT IgG: NEGATIVE

## 2012-12-27 LAB — FIBRINOGEN: Fibrinogen: 566 mg/dL — ABNORMAL HIGH (ref 204–475)

## 2012-12-27 LAB — VITAMIN B12 DEFICIENCY PANEL - CHCC

## 2012-12-27 LAB — PROTHROMBIN TIME
INR: 1.16 (ref <1.50)
Prothrombin Time: 14.7 seconds (ref 11.6–15.2)

## 2012-12-27 LAB — ERYTHROPOIETIN: Erythropoietin: 52.7 m[IU]/mL — ABNORMAL HIGH (ref 2.6–18.5)

## 2012-12-27 LAB — BETA 2 MICROGLOBULIN, SERUM: Beta-2 Microglobulin: 4.88 mg/L — ABNORMAL HIGH (ref 1.01–1.73)

## 2013-01-06 ENCOUNTER — Other Ambulatory Visit: Payer: Self-pay

## 2013-01-06 ENCOUNTER — Encounter (HOSPITAL_COMMUNITY): Payer: Self-pay | Admitting: Emergency Medicine

## 2013-01-06 ENCOUNTER — Emergency Department (HOSPITAL_COMMUNITY): Payer: Medicare (Managed Care)

## 2013-01-06 ENCOUNTER — Emergency Department (HOSPITAL_COMMUNITY)
Admission: EM | Admit: 2013-01-06 | Discharge: 2013-01-07 | Disposition: A | Discharge status: Home / Self Care | Payer: Medicare (Managed Care) | Attending: Emergency Medicine | Admitting: Emergency Medicine

## 2013-01-06 DIAGNOSIS — Z87891 Personal history of nicotine dependence: Secondary | ICD-10-CM | POA: Insufficient documentation

## 2013-01-06 DIAGNOSIS — G8929 Other chronic pain: Secondary | ICD-10-CM | POA: Insufficient documentation

## 2013-01-06 DIAGNOSIS — F329 Major depressive disorder, single episode, unspecified: Secondary | ICD-10-CM | POA: Insufficient documentation

## 2013-01-06 DIAGNOSIS — N183 Chronic kidney disease, stage 3 unspecified: Secondary | ICD-10-CM | POA: Insufficient documentation

## 2013-01-06 DIAGNOSIS — J441 Chronic obstructive pulmonary disease with (acute) exacerbation: Secondary | ICD-10-CM

## 2013-01-06 DIAGNOSIS — I129 Hypertensive chronic kidney disease with stage 1 through stage 4 chronic kidney disease, or unspecified chronic kidney disease: Secondary | ICD-10-CM | POA: Insufficient documentation

## 2013-01-06 DIAGNOSIS — R011 Cardiac murmur, unspecified: Secondary | ICD-10-CM | POA: Insufficient documentation

## 2013-01-06 DIAGNOSIS — Z79899 Other long term (current) drug therapy: Secondary | ICD-10-CM | POA: Insufficient documentation

## 2013-01-06 DIAGNOSIS — Z8669 Personal history of other diseases of the nervous system and sense organs: Secondary | ICD-10-CM | POA: Insufficient documentation

## 2013-01-06 DIAGNOSIS — E1149 Type 2 diabetes mellitus with other diabetic neurological complication: Secondary | ICD-10-CM | POA: Insufficient documentation

## 2013-01-06 DIAGNOSIS — Z862 Personal history of diseases of the blood and blood-forming organs and certain disorders involving the immune mechanism: Secondary | ICD-10-CM | POA: Insufficient documentation

## 2013-01-06 DIAGNOSIS — E559 Vitamin D deficiency, unspecified: Secondary | ICD-10-CM | POA: Insufficient documentation

## 2013-01-06 DIAGNOSIS — F0280 Dementia in other diseases classified elsewhere without behavioral disturbance: Secondary | ICD-10-CM | POA: Insufficient documentation

## 2013-01-06 DIAGNOSIS — I4891 Unspecified atrial fibrillation: Secondary | ICD-10-CM | POA: Insufficient documentation

## 2013-01-06 DIAGNOSIS — Z8739 Personal history of other diseases of the musculoskeletal system and connective tissue: Secondary | ICD-10-CM | POA: Insufficient documentation

## 2013-01-06 DIAGNOSIS — I5042 Chronic combined systolic (congestive) and diastolic (congestive) heart failure: Secondary | ICD-10-CM | POA: Insufficient documentation

## 2013-01-06 DIAGNOSIS — F3289 Other specified depressive episodes: Secondary | ICD-10-CM | POA: Insufficient documentation

## 2013-01-06 DIAGNOSIS — I509 Heart failure, unspecified: Secondary | ICD-10-CM | POA: Insufficient documentation

## 2013-01-06 DIAGNOSIS — K219 Gastro-esophageal reflux disease without esophagitis: Secondary | ICD-10-CM | POA: Insufficient documentation

## 2013-01-06 DIAGNOSIS — G309 Alzheimer's disease, unspecified: Secondary | ICD-10-CM | POA: Insufficient documentation

## 2013-01-06 DIAGNOSIS — R0600 Dyspnea, unspecified: Secondary | ICD-10-CM

## 2013-01-06 NOTE — ED Notes (Signed)
Pt is a [facility], following her health care. [phone]. If pt is to be admitted must be admitted under Internal Medicine Teaching Service.

## 2013-01-06 NOTE — ED Notes (Signed)
Pt is hard of hearing  

## 2013-01-06 NOTE — ED Notes (Signed)
Pt states her family forgot to leave her medicine out so she could not take it. Pt presented post respiratory distress, breath sounds currently clear. Placed on 2L o2 via [place] for o2 sat 91%, O2 sats increased to 96%.

## 2013-01-06 NOTE — ED Notes (Signed)
MD at bedside. 

## 2013-01-06 NOTE — ED Notes (Signed)
Per EMS- Pt brought from home where she was having respiratory distress, pt family administered one breathing treatment of 2.5 albuterol. EMS administered 2 duo nebs for wheezing and administered 125 of solumedrol. Wheezing resolved completely, respiratory distress subsided before arrival.

## 2013-01-07 LAB — BASIC METABOLIC PANEL
BUN: 36 mg/dL — ABNORMAL HIGH (ref 6–23)
CO2: 18 mEq/L — ABNORMAL LOW (ref 19–32)
Calcium: 8.9 mg/dL (ref 8.4–10.5)
Chloride: 106 mEq/L (ref 96–112)
Creatinine, Ser: 1.25 mg/dL — ABNORMAL HIGH (ref 0.50–1.10)
Sodium: 137 mEq/L (ref 135–145)

## 2013-01-07 LAB — TROPONIN I: Troponin I: <0.3 ng/mL (ref <0.30)

## 2013-01-07 LAB — CBC
HCT: 25.6 % — ABNORMAL LOW (ref 36.0–46.0)
MCHC: 34 g/dL (ref 30.0–36.0)
MCV: 90.1 fL (ref 78.0–100.0)
RDW: 15.3 % (ref 11.5–15.5)

## 2013-01-07 MED ORDER — PREDNISONE 20 MG PO TABS: 60.0000 mg | ORAL_TABLET | Freq: Every day | ORAL | Status: DC

## 2013-01-07 NOTE — Discharge Instructions (Signed)
Please follow up with your doctor for recheck in 2-3 days.  Return to the ER for worsening condition or new concerning symptoms.   Chronic Obstructive Pulmonary Disease Exacerbation Chronic obstructive pulmonary disease (COPD) is a condition that limits airflow. COPD may include chronic bronchitis, pulmonary emphysema, or both. COPD exacerbation means that your COPD has gotten worse. Without treatment, this can be a life-threatening problem. COPD exacerbation requires immediate medical care. CAUSES  COPD exacerbation can be caused by:  Exposure to smoke.  Exposure to air pollution, chemical fumes, or dust.  Respiratory infections.  Genetics, particularly alpha 1-antitrypsin deficiency.  A condition in which the body's immune system attacks itself (autoimmunity). SYMPTOMS   Increased coughing.  Increased wheezing.  Increased shortness of breath.  Swelling due to a buildup of fluid (peripheral edema) related to heart strain.  Rapid breathing.  Chest enlargement (barrel chest).  Chest tightness. DIAGNOSIS  There is no single test that can diagnosis COPD exacerbation. Your history, physical exam, and other tests will help your caregiver make a diagnosis. Tests may include a chest X-ray, pulmonary function tests, spirometry, basic lab tests, and an arterial blood gas test. TREATMENT  Severe problems may require a stay in the hospital. Depending on the cause of your problems, the following may be prescribed:  Antibiotic medicines.  Bronchodilators (inhaled or tablets).  Cortisone medicines (inhaled or tablets).  Supplemental oxygen therapy.  Pulmonary rehabilitation. This is a broad program that may involve exercise, nutrition counseling, breathing techniques, and further education about your condition. It is important to use good technique with inhaled medicines. Spacer devices may be needed to help improve drug delivery. HOME CARE INSTRUCTIONS   Do not smoke. Quitting  smoking is very important to prevent worsening of COPD.  Avoid exposure to all substances that irritate the airway, especially tobacco smoke.  If prescribed, take your antibiotics as directed. Finish them even if you start to feel better.  Only take over-the-counter or prescription medicines as directed by your caregiver.  Drink enough fluids to keep your urine clear or pale yellow. This can help thin bronchial secretions.  Use a cool mist vaporizer. This makes it easier to clear your chest when you cough.  If you have a home nebulizer and oxygen, continue to use them as directed.  Maintain all necessary vaccinations to prevent infections.  Exercise regularly.  Eat a healthy diet.  Keep all follow-up appointments as directed by your caregiver. SEEK IMMEDIATE MEDICAL CARE IF:  You have extreme shortness of breath.  You have trouble talking.  You have severe chest pain or blood in your sputum.  You have a high fever, weakness, repeated vomiting, or fainting.  You feel confused.  You keep getting worse. MAKE SURE YOU:   Understand these instructions.  Will watch your condition.  Will get help right away if you are not doing well or get worse. Document Released: 12/27/2006 Document Revised: 05/24/2011 Document Reviewed: 10/27/2010 [facility] Patient Information 2014 [place], [place].

## 2013-01-07 NOTE — ED Provider Notes (Signed)
CSN: [id number]     Arrival date & time 01/06/13  2305 History   First MD Initiated Contact with Patient 01/06/13 2324     Chief Complaint  Patient presents with  . Shortness of Breath   (Consider location/radiation/quality/duration/timing/severity/associated sxs/prior Treatment) HPI 79 yo female presents to the ER from home via EMS with complaint of shortness of breath.  Pt reports she didn't have access to her medications this afternoon, and became very short of breath.  Once she was given breathing treatment by family she felt slightly better, and then received another 2 tx from EMS and solumedrol.  Pt has h/o COPD, CHF.  Pt now back to baseline, no further sob.  She denies any chest pain with her sob earlier.  No fever.  She has a baseline cough.    Past Medical History  Diagnosis Date  . Alzheimer's disease   . 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 Chronic kidney disease, stage III (moderate)   . Unspecified vitamin D deficiency   . Arthritis     osteo  . Hypertension   . Hyperlipidemia   . Depression   . GERD (gastroesophageal reflux disease)   . Cataract   . Exudative senile macular degeneration of retina   . Retinal neovascularization NOS   . Chronic combined systolic and diastolic heart failure   . Non-ischemic cardiomyopathy     EF 20-25% 2D 12/13  . PAF (paroxysmal atrial fibrillation)   . Pure hypercholesterolemia   . Esophageal reflux   . COPD (chronic obstructive pulmonary disease)   . Primary localized osteoarthrosis, lower leg   . Lumbago   . Vitamin D deficiency   . Macular degeneration   . Chronic pain     back  . Aortic stenosis, mild     echo 12/13  . Normal coronary arteries 10/09, 6/12  . LBBB (left bundle branch block)   . Microcytic anemia, endoscopy negative for PUD 03/01/12 02/28/2012   Past Surgical History  Procedure Laterality Date  . Knee surgery  1997  . Appendectomy  1949  . Tonsillectomy    . Esophagogastroduodenoscopy   03/01/2012    Procedure: ESOPHAGOGASTRODUODENOSCOPY (EGD);  Surgeon: Graylin Shiver, MD;  Location: [facility] ENDOSCOPY;  Service: Endoscopy;  Laterality: N/A;  . Abdominal hysterectomy  1970   Family History  Problem Relation Age of Onset  . Stroke Mother   . Heart disease Father   . Cancer Brother    History  Substance Use Topics  . Smoking status: Former Smoker    Quit date: 05/27/2007  . Smokeless tobacco: Never Used  . Alcohol Use: No   OB History   Grav Para Term Preterm Abortions TAB SAB Ect Mult Living                 Review of Systems  Unable to perform ROS: Dementia    Allergies  Darvon and Paroxetine  Home Medications   Current Outpatient Rx  Name  Route  Sig  Dispense  Refill  . albuterol (PROAIR HFA) 108 (90 BASE) MCG/ACT inhaler   Inhalation   Inhale 2 puffs into the lungs every 4 (four) hours as needed.   6.7 g   3   . albuterol (PROVENTIL) (2.5 MG/3ML) 0.083% nebulizer solution   Nebulization   Take 2.5 mg by nebulization every 6 (six) hours as needed for wheezing or shortness of breath.         . carvedilol (COREG) 6.25 MG tablet  Oral   Take 1 tablet (6.25 mg total) by mouth 2 (two) times daily with a meal.   60 tablet   2   . cholecalciferol (VITAMIN D) 1000 UNITS tablet   Oral   Take 1,000 Units by mouth daily.         . citalopram (CELEXA) 20 MG tablet   Oral   Take 20 mg by mouth daily.         Marland Kitchen donepezil (ARICEPT) 10 MG tablet   Oral   Take 10 mg by mouth at bedtime as needed.         . furosemide (LASIX) 40 MG tablet   Oral   Take 1 tablet (40 mg total) by mouth 2 (two) times daily.   60 tablet   1   . HYDROcodone-acetaminophen (NORCO/VICODIN) 5-325 MG per tablet   Oral   Take 1 tablet by mouth every 6 (six) hours as needed for pain.         . isosorbide mononitrate (IMDUR) 30 MG 24 hr tablet   Oral   Take 30 mg by mouth daily.         Marland Kitchen omeprazole (PRILOSEC) 20 MG capsule   Oral   Take 20 mg by mouth daily.          . PSYLLIUM PO   Oral   Take 3 capsules by mouth daily.         Marland Kitchen spironolactone (ALDACTONE) 25 MG tablet   Oral   Take 1 tablet (25 mg total) by mouth daily.   30 tablet   1   . ipratropium-albuterol (DUONEB) 0.5-2.5 (3) MG/3ML SOLN   Nebulization   Take 3 mLs by nebulization once.         . methylPREDNISolone sodium succinate (SOLU-MEDROL) 125 mg/2 mL injection   Intravenous   Inject 125 mg into the vein once.          Pulse 86  Temp(Src) 98.2 F (36.8 C) (Oral)  Resp 19  SpO2 96% Physical Exam  Constitutional: She is oriented to person, place, and time. She appears well-developed and well-nourished. No distress.  HENT:  Head: Normocephalic and atraumatic.  Nose: Nose normal.  Mouth/Throat: Oropharynx is clear and moist.  Eyes: Conjunctivae and EOM are normal. Pupils are equal, round, and reactive to light.  Neck: Normal range of motion. Neck supple. No JVD present. No tracheal deviation present. No thyromegaly present.  Cardiovascular: Normal rate, regular rhythm and intact distal pulses.  Exam reveals no gallop and no friction rub.   Murmur heard. Pulmonary/Chest: Effort normal. No stridor. No respiratory distress. She has wheezes (trace end expiratory wheezing). She has no rales. She exhibits no tenderness.  Prolonged expiratory phase  Abdominal: Soft. Bowel sounds are normal. She exhibits no distension and no mass. There is no tenderness. There is no rebound and no guarding.  Musculoskeletal: Normal range of motion. She exhibits edema (trace bil). She exhibits no tenderness.  Lymphadenopathy:    She has no cervical adenopathy.  Neurological: She is alert and oriented to person, place, and time. She exhibits normal muscle tone. Coordination normal.  Skin: Skin is warm and dry. No rash noted. No erythema. No pallor.  Psychiatric: She has a normal mood and affect. Her behavior is normal. Judgment and thought content normal.    ED Course  Procedures  (including critical care time) Labs Review Labs Reviewed  CBC - Abnormal; Notable for the following:    RBC 2.84 (*)  Hemoglobin 8.7 (*)    HCT 25.6 (*)    Platelets 148 (*)    All other components within normal limits  BASIC METABOLIC PANEL  TROPONIN I  PRO B NATRIURETIC PEPTIDE   Imaging Review Dg Chest 2 View  01/07/2013   CLINICAL DATA:  Respiratory distress. Chest pain.  EXAM: CHEST  2 VIEW  COMPARISON:  09/08/2012.  FINDINGS: Cardiopericardial silhouette upper limits of normal for projection. There is pulmonary vascular congestion and mild alveolar pulmonary edema most compatible with volume overload/ CHF. Probable small bilateral pleural or fusions on the lateral view. Aortic arch atherosclerosis. Calcification of the tracheobronchial tree. Monitoring leads project over the upper abdomen.  IMPRESSION: Constellation of findings compatible with mild to moderate CHF.   Electronically Signed   By: Andreas Newport M.D.   On: 01/07/2013 00:06    Date: 01/06/2013  Rate: 83  Rhythm: normal sinus rhythm  QRS Axis: normal  Intervals: normal  ST/T Wave abnormalities: normal  Conduction Disutrbances:left bundle branch block  Narrative Interpretation:   Old EKG Reviewed: unchanged   EKG Interpretation   None       MDM   1. COPD exacerbation   2. Dyspnea    79 yo female with dyspnea, wheezing, now resolved.  Given h/o chf, will check cxr, ekg, labs.    Olivia Mackie, MD 01/07/13 [phone]

## 2013-01-07 NOTE — ED Notes (Signed)
Pt discharge ready but patient waiting for clothes and her ride since she arrived via EMS.

## 2013-01-09 ENCOUNTER — Ambulatory Visit: Payer: Medicare (Managed Care) | Admitting: Hematology and Oncology

## 2013-01-22 ENCOUNTER — Other Ambulatory Visit: Payer: Self-pay | Admitting: *Deleted

## 2013-01-22 ENCOUNTER — Ambulatory Visit
Admission: RE | Admit: 2013-01-22 | Discharge: 2013-01-22 | Disposition: A | Discharge status: Home / Self Care | Payer: Medicare (Managed Care) | Source: Ambulatory Visit | Attending: *Deleted | Admitting: *Deleted

## 2013-01-22 DIAGNOSIS — M549 Dorsalgia, unspecified: Secondary | ICD-10-CM

## 2013-02-05 ENCOUNTER — Other Ambulatory Visit: Payer: Self-pay

## 2013-02-05 MED ORDER — HYDROCODONE-ACETAMINOPHEN 5-325 MG PO TABS: ORAL_TABLET | ORAL | Status: DC

## 2013-02-05 MED ORDER — MORPHINE SULFATE ER 15 MG PO TBCR: EXTENDED_RELEASE_TABLET | ORAL | Status: DC

## 2013-02-14 ENCOUNTER — Encounter: Payer: Self-pay | Admitting: Family

## 2013-02-19 ENCOUNTER — Other Ambulatory Visit: Payer: Self-pay | Admitting: *Deleted

## 2013-02-19 MED ORDER — MORPHINE SULFATE ER 15 MG PO TBCR: EXTENDED_RELEASE_TABLET | ORAL | Status: DC

## 2013-02-21 ENCOUNTER — Other Ambulatory Visit: Payer: Self-pay | Admitting: *Deleted

## 2013-02-21 MED ORDER — LORAZEPAM 0.5 MG PO TABS: ORAL_TABLET | ORAL | Status: DC

## 2013-03-12 ENCOUNTER — Other Ambulatory Visit: Payer: Self-pay | Admitting: *Deleted

## 2013-03-12 MED ORDER — LORAZEPAM 0.5 MG PO TABS: ORAL_TABLET | ORAL | Status: DC

## 2013-03-13 ENCOUNTER — Other Ambulatory Visit (HOSPITAL_COMMUNITY): Payer: Self-pay | Admitting: *Deleted

## 2013-03-14 ENCOUNTER — Encounter (HOSPITAL_COMMUNITY)
Admission: RE | Admit: 2013-03-14 | Discharge: 2013-03-14 | Disposition: A | Discharge status: Home / Self Care | Payer: Medicare (Managed Care) | Source: Ambulatory Visit | Attending: Family Medicine | Admitting: Family Medicine

## 2013-03-14 DIAGNOSIS — D649 Anemia, unspecified: Secondary | ICD-10-CM | POA: Insufficient documentation

## 2013-03-14 LAB — PREPARE RBC (CROSSMATCH)

## 2013-03-16 ENCOUNTER — Encounter (HOSPITAL_COMMUNITY)
Admission: RE | Admit: 2013-03-16 | Discharge: 2013-03-16 | Disposition: A | Discharge status: Home / Self Care | Payer: Medicare (Managed Care) | Source: Ambulatory Visit | Attending: Family Medicine | Admitting: Family Medicine

## 2013-03-16 ENCOUNTER — Other Ambulatory Visit: Payer: Self-pay

## 2013-03-16 ENCOUNTER — Inpatient Hospital Stay (HOSPITAL_COMMUNITY)
Admission: EM | Admit: 2013-03-16 | Discharge: 2013-03-19 | DRG: 292 | Disposition: A | Discharge status: Skilled Nursing Facility | Payer: Medicare (Managed Care) | Attending: Internal Medicine | Admitting: Internal Medicine

## 2013-03-16 ENCOUNTER — Emergency Department (HOSPITAL_COMMUNITY): Payer: Medicare (Managed Care)

## 2013-03-16 ENCOUNTER — Encounter (HOSPITAL_COMMUNITY): Payer: Self-pay | Admitting: Emergency Medicine

## 2013-03-16 DIAGNOSIS — Z823 Family history of stroke: Secondary | ICD-10-CM

## 2013-03-16 DIAGNOSIS — I10 Essential (primary) hypertension: Secondary | ICD-10-CM | POA: Diagnosis present

## 2013-03-16 DIAGNOSIS — I129 Hypertensive chronic kidney disease with stage 1 through stage 4 chronic kidney disease, or unspecified chronic kidney disease: Secondary | ICD-10-CM | POA: Diagnosis present

## 2013-03-16 DIAGNOSIS — Z9981 Dependence on supplemental oxygen: Secondary | ICD-10-CM

## 2013-03-16 DIAGNOSIS — I5043 Acute on chronic combined systolic (congestive) and diastolic (congestive) heart failure: Principal | ICD-10-CM | POA: Diagnosis present

## 2013-03-16 DIAGNOSIS — H9190 Unspecified hearing loss, unspecified ear: Secondary | ICD-10-CM | POA: Diagnosis present

## 2013-03-16 DIAGNOSIS — G3184 Mild cognitive impairment, so stated: Secondary | ICD-10-CM | POA: Diagnosis present

## 2013-03-16 DIAGNOSIS — R609 Edema, unspecified: Secondary | ICD-10-CM | POA: Diagnosis present

## 2013-03-16 DIAGNOSIS — M549 Dorsalgia, unspecified: Secondary | ICD-10-CM | POA: Diagnosis present

## 2013-03-16 DIAGNOSIS — E7800 Pure hypercholesterolemia, unspecified: Secondary | ICD-10-CM | POA: Diagnosis present

## 2013-03-16 DIAGNOSIS — I5023 Acute on chronic systolic (congestive) heart failure: Secondary | ICD-10-CM | POA: Diagnosis present

## 2013-03-16 DIAGNOSIS — F411 Generalized anxiety disorder: Secondary | ICD-10-CM | POA: Diagnosis present

## 2013-03-16 DIAGNOSIS — Z66 Do not resuscitate: Secondary | ICD-10-CM | POA: Diagnosis present

## 2013-03-16 DIAGNOSIS — F1910 Other psychoactive substance abuse, uncomplicated: Secondary | ICD-10-CM | POA: Diagnosis present

## 2013-03-16 DIAGNOSIS — I4891 Unspecified atrial fibrillation: Secondary | ICD-10-CM | POA: Diagnosis present

## 2013-03-16 DIAGNOSIS — E1149 Type 2 diabetes mellitus with other diabetic neurological complication: Secondary | ICD-10-CM | POA: Diagnosis present

## 2013-03-16 DIAGNOSIS — Z79899 Other long term (current) drug therapy: Secondary | ICD-10-CM

## 2013-03-16 DIAGNOSIS — I2589 Other forms of chronic ischemic heart disease: Secondary | ICD-10-CM | POA: Diagnosis present

## 2013-03-16 DIAGNOSIS — F0280 Dementia in other diseases classified elsewhere without behavioral disturbance: Secondary | ICD-10-CM | POA: Diagnosis present

## 2013-03-16 DIAGNOSIS — F3289 Other specified depressive episodes: Secondary | ICD-10-CM | POA: Diagnosis present

## 2013-03-16 DIAGNOSIS — Z87891 Personal history of nicotine dependence: Secondary | ICD-10-CM

## 2013-03-16 DIAGNOSIS — I509 Heart failure, unspecified: Secondary | ICD-10-CM | POA: Insufficient documentation

## 2013-03-16 DIAGNOSIS — M1990 Unspecified osteoarthritis, unspecified site: Secondary | ICD-10-CM | POA: Diagnosis present

## 2013-03-16 DIAGNOSIS — K219 Gastro-esophageal reflux disease without esophagitis: Secondary | ICD-10-CM | POA: Diagnosis present

## 2013-03-16 DIAGNOSIS — D509 Iron deficiency anemia, unspecified: Secondary | ICD-10-CM | POA: Diagnosis present

## 2013-03-16 DIAGNOSIS — F329 Major depressive disorder, single episode, unspecified: Secondary | ICD-10-CM | POA: Diagnosis present

## 2013-03-16 DIAGNOSIS — N183 Chronic kidney disease, stage 3 unspecified: Secondary | ICD-10-CM | POA: Diagnosis present

## 2013-03-16 DIAGNOSIS — E559 Vitamin D deficiency, unspecified: Secondary | ICD-10-CM | POA: Diagnosis present

## 2013-03-16 DIAGNOSIS — D500 Iron deficiency anemia secondary to blood loss (chronic): Secondary | ICD-10-CM | POA: Insufficient documentation

## 2013-03-16 DIAGNOSIS — E785 Hyperlipidemia, unspecified: Secondary | ICD-10-CM | POA: Diagnosis present

## 2013-03-16 DIAGNOSIS — G8929 Other chronic pain: Secondary | ICD-10-CM | POA: Diagnosis present

## 2013-03-16 DIAGNOSIS — J4489 Other specified chronic obstructive pulmonary disease: Secondary | ICD-10-CM | POA: Diagnosis present

## 2013-03-16 DIAGNOSIS — K922 Gastrointestinal hemorrhage, unspecified: Secondary | ICD-10-CM | POA: Insufficient documentation

## 2013-03-16 DIAGNOSIS — Z809 Family history of malignant neoplasm, unspecified: Secondary | ICD-10-CM

## 2013-03-16 DIAGNOSIS — G309 Alzheimer's disease, unspecified: Secondary | ICD-10-CM | POA: Diagnosis present

## 2013-03-16 DIAGNOSIS — R32 Unspecified urinary incontinence: Secondary | ICD-10-CM | POA: Diagnosis present

## 2013-03-16 DIAGNOSIS — J449 Chronic obstructive pulmonary disease, unspecified: Secondary | ICD-10-CM | POA: Diagnosis present

## 2013-03-16 DIAGNOSIS — I447 Left bundle-branch block, unspecified: Secondary | ICD-10-CM | POA: Diagnosis present

## 2013-03-16 DIAGNOSIS — Z8249 Family history of ischemic heart disease and other diseases of the circulatory system: Secondary | ICD-10-CM

## 2013-03-16 DIAGNOSIS — Z889 Allergy status to unspecified drugs, medicaments and biological substances status: Secondary | ICD-10-CM

## 2013-03-16 DIAGNOSIS — N390 Urinary tract infection, site not specified: Secondary | ICD-10-CM | POA: Diagnosis present

## 2013-03-16 DIAGNOSIS — D696 Thrombocytopenia, unspecified: Secondary | ICD-10-CM | POA: Diagnosis present

## 2013-03-16 DIAGNOSIS — B961 Klebsiella pneumoniae [K. pneumoniae] as the cause of diseases classified elsewhere: Secondary | ICD-10-CM | POA: Diagnosis present

## 2013-03-16 DIAGNOSIS — I359 Nonrheumatic aortic valve disorder, unspecified: Secondary | ICD-10-CM | POA: Diagnosis present

## 2013-03-16 DIAGNOSIS — D649 Anemia, unspecified: Secondary | ICD-10-CM | POA: Diagnosis present

## 2013-03-16 HISTORY — DX: Myoneural disorder, unspecified: G70.9

## 2013-03-16 HISTORY — DX: Shortness of breath: R06.02

## 2013-03-16 LAB — BASIC METABOLIC PANEL
BUN: 44 mg/dL — ABNORMAL HIGH (ref 6–23)
CO2: 28 meq/L (ref 19–32)
CREATININE: 1.44 mg/dL — AB (ref 0.50–1.10)
Calcium: 8.5 mg/dL (ref 8.4–10.5)
Chloride: 101 mEq/L (ref 96–112)
GFR calc non Af Amer: 33 mL/min — ABNORMAL LOW (ref >90)
GFR, EST AFRICAN AMERICAN: 38 mL/min — AB (ref >90)
Glucose, Bld: 129 mg/dL — ABNORMAL HIGH (ref 70–99)
Potassium: 4.1 mEq/L (ref 3.7–5.3)
Sodium: 142 mEq/L (ref 137–147)

## 2013-03-16 LAB — URINALYSIS, ROUTINE W REFLEX MICROSCOPIC
BILIRUBIN URINE: NEGATIVE
GLUCOSE, UA: NEGATIVE mg/dL
HGB URINE DIPSTICK: NEGATIVE
Ketones, ur: NEGATIVE mg/dL
Nitrite: POSITIVE — AB
PROTEIN: NEGATIVE mg/dL
Specific Gravity, Urine: 1.009 (ref 1.005–1.030)
Urobilinogen, UA: 1 mg/dL (ref 0.0–1.0)
pH: 5.5 (ref 5.0–8.0)

## 2013-03-16 LAB — CBC WITH DIFFERENTIAL/PLATELET
BASOS ABS: 0 10*3/uL (ref 0.0–0.1)
BASOS PCT: 1 % (ref 0–1)
EOS ABS: 0.1 10*3/uL (ref 0.0–0.7)
EOS PCT: 1 % (ref 0–5)
HCT: 28.2 % — ABNORMAL LOW (ref 36.0–46.0)
Hemoglobin: 9.3 g/dL — ABNORMAL LOW (ref 12.0–15.0)
Lymphocytes Relative: 5 % — ABNORMAL LOW (ref 12–46)
Lymphs Abs: 0.2 10*3/uL — ABNORMAL LOW (ref 0.7–4.0)
MCH: 31.1 pg (ref 26.0–34.0)
MCHC: 33 g/dL (ref 30.0–36.0)
MCV: 94.3 fL (ref 78.0–100.0)
MONO ABS: 0.6 10*3/uL (ref 0.1–1.0)
Monocytes Relative: 14 % — ABNORMAL HIGH (ref 3–12)
Neutro Abs: 3.5 10*3/uL (ref 1.7–7.7)
Neutrophils Relative %: 78 % — ABNORMAL HIGH (ref 43–77)
PLATELETS: 74 10*3/uL — AB (ref 150–400)
RBC: 2.99 MIL/uL — ABNORMAL LOW (ref 3.87–5.11)
RDW: 15 % (ref 11.5–15.5)
WBC: 4.4 10*3/uL (ref 4.0–10.5)

## 2013-03-16 LAB — URINE MICROSCOPIC-ADD ON

## 2013-03-16 LAB — PRO B NATRIURETIC PEPTIDE: PRO B NATRI PEPTIDE: 6942 pg/mL — AB (ref 0–450)

## 2013-03-16 LAB — POCT I-STAT TROPONIN I: Troponin i, poc: 0.02 ng/mL (ref 0.00–0.08)

## 2013-03-16 LAB — GLUCOSE, CAPILLARY: Glucose-Capillary: 112 mg/dL — ABNORMAL HIGH (ref 70–99)

## 2013-03-16 LAB — TROPONIN I

## 2013-03-16 MED ORDER — MORPHINE SULFATE ER 15 MG PO TBCR
15.0000 mg | EXTENDED_RELEASE_TABLET | Freq: Two times a day (BID) | ORAL | Status: DC
  Administered 2013-03-16 – 2013-03-19 (×6): 15 mg via ORAL
  Filled 2013-03-16 (×6): qty 1

## 2013-03-16 MED ORDER — ALBUTEROL SULFATE (2.5 MG/3ML) 0.083% IN NEBU
2.5000 mg | INHALATION_SOLUTION | RESPIRATORY_TRACT | Status: DC
  Administered 2013-03-16 – 2013-03-17 (×4): 2.5 mg via RESPIRATORY_TRACT
  Filled 2013-03-16 (×9): qty 3

## 2013-03-16 MED ORDER — IPRATROPIUM BROMIDE 0.02 % IN SOLN
0.5000 mg | RESPIRATORY_TRACT | Status: DC
  Administered 2013-03-16 – 2013-03-17 (×4): 0.5 mg via RESPIRATORY_TRACT
  Filled 2013-03-16 (×4): qty 2.5

## 2013-03-16 MED ORDER — CARVEDILOL 6.25 MG PO TABS
6.2500 mg | ORAL_TABLET | Freq: Two times a day (BID) | ORAL | Status: DC
  Administered 2013-03-17 – 2013-03-19 (×4): 6.25 mg via ORAL
  Filled 2013-03-16 (×7): qty 1

## 2013-03-16 MED ORDER — ALBUTEROL SULFATE (2.5 MG/3ML) 0.083% IN NEBU
2.5000 mg | INHALATION_SOLUTION | Freq: Once | RESPIRATORY_TRACT | Status: AC
  Administered 2013-03-16: 2.5 mg via RESPIRATORY_TRACT
  Filled 2013-03-16: qty 3

## 2013-03-16 MED ORDER — PANTOPRAZOLE SODIUM 40 MG PO TBEC
40.0000 mg | DELAYED_RELEASE_TABLET | Freq: Every day | ORAL | Status: DC
  Administered 2013-03-16 – 2013-03-19 (×4): 40 mg via ORAL
  Filled 2013-03-16 (×3): qty 1

## 2013-03-16 MED ORDER — SODIUM CHLORIDE 0.9 % IJ SOLN
3.0000 mL | Freq: Two times a day (BID) | INTRAMUSCULAR | Status: DC
  Administered 2013-03-16 – 2013-03-19 (×6): 3 mL via INTRAVENOUS

## 2013-03-16 MED ORDER — IPRATROPIUM BROMIDE 0.02 % IN SOLN
0.5000 mg | Freq: Once | RESPIRATORY_TRACT | Status: AC
  Administered 2013-03-16: 0.5 mg via RESPIRATORY_TRACT
  Filled 2013-03-16: qty 2.5

## 2013-03-16 MED ORDER — SPIRONOLACTONE 25 MG PO TABS
25.0000 mg | ORAL_TABLET | Freq: Every day | ORAL | Status: DC
  Filled 2013-03-16: qty 1

## 2013-03-16 MED ORDER — CITALOPRAM HYDROBROMIDE 20 MG PO TABS
20.0000 mg | ORAL_TABLET | Freq: Every day | ORAL | Status: DC
  Administered 2013-03-17 – 2013-03-19 (×3): 20 mg via ORAL
  Filled 2013-03-16 (×4): qty 1

## 2013-03-16 MED ORDER — FUROSEMIDE 10 MG/ML IJ SOLN
80.0000 mg | Freq: Once | INTRAMUSCULAR | Status: AC
  Administered 2013-03-16: 80 mg via INTRAVENOUS
  Filled 2013-03-16: qty 8

## 2013-03-16 MED ORDER — PREDNISONE 50 MG PO TABS
60.0000 mg | ORAL_TABLET | Freq: Every day | ORAL | Status: DC
  Administered 2013-03-17 – 2013-03-19 (×3): 60 mg via ORAL
  Filled 2013-03-16 (×5): qty 1

## 2013-03-16 MED ORDER — METHYLPREDNISOLONE SODIUM SUCC 125 MG IJ SOLR
80.0000 mg | Freq: Three times a day (TID) | INTRAMUSCULAR | Status: AC
  Administered 2013-03-16 (×2): 80 mg via INTRAVENOUS
  Filled 2013-03-16: qty 1.28
  Filled 2013-03-16: qty 2
  Filled 2013-03-16: qty 1.28

## 2013-03-16 MED ORDER — HEPARIN SODIUM (PORCINE) 5000 UNIT/ML IJ SOLN
5000.0000 [IU] | Freq: Three times a day (TID) | INTRAMUSCULAR | Status: DC
  Administered 2013-03-16 – 2013-03-17 (×3): 5000 [IU] via SUBCUTANEOUS
  Filled 2013-03-16 (×12): qty 1

## 2013-03-16 MED ORDER — ENOXAPARIN SODIUM 40 MG/0.4ML ~~LOC~~ SOLN: 40.0000 mg | SUBCUTANEOUS | Status: DC

## 2013-03-16 MED ORDER — DONEPEZIL HCL 10 MG PO TABS
10.0000 mg | ORAL_TABLET | Freq: Every day | ORAL | Status: DC
  Administered 2013-03-16 – 2013-03-18 (×3): 10 mg via ORAL
  Filled 2013-03-16 (×4): qty 1

## 2013-03-16 MED ORDER — MORPHINE SULFATE ER 15 MG PO TBCR: 15.0000 mg | EXTENDED_RELEASE_TABLET | Freq: Two times a day (BID) | ORAL | Status: DC

## 2013-03-16 MED ORDER — CARVEDILOL 6.25 MG PO TABS
6.2500 mg | ORAL_TABLET | Freq: Two times a day (BID) | ORAL | Status: DC
  Filled 2013-03-16 (×2): qty 1

## 2013-03-16 MED ORDER — ISOSORBIDE MONONITRATE ER 30 MG PO TB24
30.0000 mg | ORAL_TABLET | Freq: Every day | ORAL | Status: DC
  Administered 2013-03-17 – 2013-03-19 (×3): 30 mg via ORAL
  Filled 2013-03-16 (×4): qty 1

## 2013-03-16 MED ORDER — VITAMIN D3 25 MCG (1000 UNIT) PO TABS
1000.0000 [IU] | ORAL_TABLET | Freq: Every day | ORAL | Status: DC
  Administered 2013-03-17 – 2013-03-19 (×3): 1000 [IU] via ORAL
  Filled 2013-03-16 (×4): qty 1

## 2013-03-16 MED ORDER — CIPROFLOXACIN HCL 250 MG PO TABS
250.0000 mg | ORAL_TABLET | Freq: Two times a day (BID) | ORAL | Status: DC
  Administered 2013-03-17 – 2013-03-19 (×5): 250 mg via ORAL
  Filled 2013-03-16 (×7): qty 1

## 2013-03-16 MED ORDER — ACETAMINOPHEN 325 MG PO TABS
650.0000 mg | ORAL_TABLET | Freq: Every day | ORAL | Status: DC | PRN
  Administered 2013-03-17 – 2013-03-18 (×3): 650 mg via ORAL
  Filled 2013-03-16 (×4): qty 2

## 2013-03-16 MED ORDER — ALBUTEROL SULFATE (2.5 MG/3ML) 0.083% IN NEBU
INHALATION_SOLUTION | RESPIRATORY_TRACT | Status: AC
Start: 2013-03-16 — End: 2013-03-17
  Filled 2013-03-16: qty 3

## 2013-03-16 MED ORDER — IPRATROPIUM BROMIDE 0.02 % IN SOLN
RESPIRATORY_TRACT | Status: AC
  Filled 2013-03-16: qty 2.5

## 2013-03-16 MED ORDER — SPIRONOLACTONE 25 MG PO TABS
25.0000 mg | ORAL_TABLET | Freq: Every day | ORAL | Status: DC
  Administered 2013-03-17 – 2013-03-19 (×3): 25 mg via ORAL
  Filled 2013-03-16 (×3): qty 1

## 2013-03-16 MED ORDER — DEXTROSE 5 % IV SOLN
1.0000 g | Freq: Once | INTRAVENOUS | Status: AC
  Administered 2013-03-16: 1 g via INTRAVENOUS
  Filled 2013-03-16: qty 10

## 2013-03-16 NOTE — H&P (Signed)
Date: 03/16/2013               Patient Name:  [name] MRN: [id number]  DOB: 1930/01/19 Age / Sex: 80 y.o., female   PCP: Jethro Bastos, MD         Medical Service: Internal Medicine Teaching Service         Attending Physician: Dr. Rocco Serene, MD    First Contact: Dr. Johna Roles Pager: [phone]  Second Contact: Dr. Virgina Organ Pager: [phone]       After Hours (After 5p/  First Contact Pager: [phone]  weekends / holidays): Second Contact Pager: [phone]   Chief Complaint: shortness of breath, wheezing, and leg swelling   History of Present Illness:   [name] is a 80 year old female with past medical history of combined CHF (last 2D-echo 20-25% on 02/28/12) due to ischemic cardiomyopathy, hypertension, paroxysmal atrial fibrillation, COPD on home oxygen, non-insulin Type II DM, Stage III CKD,  chronic thrombocytopenia, and chronic anemia requiring blood and iron transfusions who presents with shortness of breath, wheezing, and leg swelling.  Pt was seen today in short stay and to receive blood transfusion when she became short of breath with wheezing.  She reports being short of breath for the past month but worse in last 2 days with worsening wheezing from baseline. Per family she has had dry cough for the past 2 days. She has history of COPD and recently placed on home oxygen (2L [place]) since being placed in nursing home where she also receives breathing treatment frequently (x4 daily).  Her last exacerbation was 10/25 and was seen in the ED. She is a former heavy smoker.   She has history of combined CHF with last 2D-echo on 02/28/12 with EF 20-25%.  Her last exacerbation was last week with weight increase to 155 where she was given IM furosemide. Her baseline weight is 145. She reports being compliant with lasix 40 mg BID and with low-sodium diet. Per family for the last 2 days she has weeping from her swollen legs.   She denies fever, chills, rhinorrhea, sore throat,  body aches,  chest pain, nausea, vomiting, abdominal pain,  lightheadness, headache, or change in BM or urination.        Meds: No current facility-administered medications on file prior to encounter.   Current Outpatient Prescriptions on File Prior to Encounter  Medication Sig Dispense Refill  . carvedilol (COREG) 6.25 MG tablet Take 1 tablet (6.25 mg total) by mouth 2 (two) times daily with a meal.  60 tablet  2  . cholecalciferol (VITAMIN D) 1000 UNITS tablet Take 1,000 Units by mouth daily.      . citalopram (CELEXA) 20 MG tablet Take 20 mg by mouth daily.      . isosorbide mononitrate (IMDUR) 30 MG 24 hr tablet Take 30 mg by mouth daily.      Marland Kitchen omeprazole (PRILOSEC) 20 MG capsule Take 20 mg by mouth daily.      Marland Kitchen spironolactone (ALDACTONE) 25 MG tablet Take 1 tablet (25 mg total) by mouth daily.  30 tablet  1     Current Facility-Administered Medications  Medication Dose Route Frequency Provider Last Rate Last Dose  . albuterol (PROVENTIL) (2.5 MG/3ML) 0.083% nebulizer solution 2.5 mg  2.5 mg Nebulization Q4H Darden Palmer, MD      . carvedilol (COREG) tablet 6.25 mg  6.25 mg Oral BID WC Darden Palmer, MD      . cholecalciferol (VITAMIN D) tablet  1,000 Units  1,000 Units Oral Daily Darden Palmer, MD      . citalopram (CELEXA) tablet 20 mg  20 mg Oral Daily Darden Palmer, MD      . donepezil (ARICEPT) tablet 10 mg  10 mg Oral QHS Darden Palmer, MD      . enoxaparin (LOVENOX) injection 40 mg  40 mg Subcutaneous Q24H Darden Palmer, MD      . ipratropium (ATROVENT) nebulizer solution 0.5 mg  0.5 mg Nebulization Q4H Darden Palmer, MD      . isosorbide mononitrate (IMDUR) 24 hr tablet 30 mg  30 mg Oral Daily Darden Palmer, MD      . methylPREDNISolone sodium succinate (SOLU-MEDROL) 125 mg/2 mL injection 80 mg  80 mg Intravenous Q8H Darden Palmer, MD      . morphine (MS CONTIN) 12 hr tablet 15 mg  15 mg Oral Q12H Darden Palmer, MD      . pantoprazole (PROTONIX) EC tablet 40 mg   40 mg Oral Daily Darden Palmer, MD      . sodium chloride 0.9 % injection 3 mL  3 mL Intravenous Q12H Darden Palmer, MD      . spironolactone (ALDACTONE) tablet 25 mg  25 mg Oral Daily Darden Palmer, MD        Allergies: Allergies as of 03/16/2013 - Review Complete 03/16/2013  Allergen Reaction Noted  . Darvon [propoxyphene hcl] Nausea Only 05/25/2012  . Paroxetine Other (See Comments)    Past Medical History  Diagnosis Date  . Alzheimer's disease   . 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 Chronic kidney disease, stage III (moderate)   . Unspecified vitamin D deficiency   . Arthritis     osteo  . Hypertension   . Hyperlipidemia   . Depression   . GERD (gastroesophageal reflux disease)   . Cataract   . Exudative senile macular degeneration of retina   . Retinal neovascularization NOS   . Chronic combined systolic and diastolic heart failure   . Non-ischemic cardiomyopathy     EF 20-25% 2D 12/13  . PAF (paroxysmal atrial fibrillation)   . Pure hypercholesterolemia   . Esophageal reflux   . COPD (chronic obstructive pulmonary disease)   . Primary localized osteoarthrosis, lower leg   . Lumbago   . Vitamin D deficiency   . Macular degeneration   . Chronic pain     back  . Aortic stenosis, mild     echo 12/13  . Normal coronary arteries 10/09, 6/12  . LBBB (left bundle branch block)   . Microcytic anemia, endoscopy negative for PUD 03/01/12 02/28/2012   Past Surgical History  Procedure Laterality Date  . Knee surgery  1997  . Appendectomy  1949  . Tonsillectomy    . Esophagogastroduodenoscopy  03/01/2012    Procedure: ESOPHAGOGASTRODUODENOSCOPY (EGD);  Surgeon: Graylin Shiver, MD;  Location: [facility] ENDOSCOPY;  Service: Endoscopy;  Laterality: N/A;  . Abdominal hysterectomy  1970   Family History  Problem Relation Age of Onset  . Stroke Mother   . Heart disease Father   . Cancer Brother    History   Social History  . Marital Status: Widowed     Spouse Name: N/A    Number of Children: N/A  . Years of Education: N/A   Occupational History  . Not on file.   Social History Main Topics  . Smoking status: Former Smoker    Quit date: 05/27/2007  . Smokeless tobacco: Never Used  . Alcohol  Use: No  . Drug Use: No  . Sexual Activity: Not on file   Other Topics Concern  . Not on file   Social History Narrative  . No narrative on file    Review of Systems: Review of Systems  Constitutional: Positive for malaise/fatigue. Negative for fever, chills and diaphoresis.  HENT: Negative for congestion and sore throat.   Eyes: Negative for blurred vision.  Respiratory: Positive for cough, shortness of breath and wheezing. Negative for hemoptysis and sputum production.   Cardiovascular: Positive for leg swelling. Negative for chest pain and palpitations.  Gastrointestinal: Negative for nausea, vomiting, abdominal pain, diarrhea, constipation and blood in stool.  Genitourinary: Positive for dysuria. Negative for urgency, frequency and hematuria.       Urinary incontinence   Musculoskeletal: Positive for back pain (chronic) and falls.  Skin: Negative for rash.  Neurological: Negative for dizziness, weakness and headaches.  Psychiatric/Behavioral: Positive for depression. The patient is nervous/anxious.      Physical Exam: Blood pressure 120/51, pulse 56, temperature 98.4 F (36.9 C), temperature source Oral, resp. rate 22, height 5\' 2"  (1.575 m), weight 61.9 kg (136 lb 7.4 oz), SpO2 100.00%. Physical Exam  Constitutional: She is oriented to person, place, and time. No distress.  Pale and ill-appearing  HENT:  Head: Normocephalic and atraumatic.  Right Ear: External ear normal.  Left Ear: External ear normal.  Nose: Nose normal.  Mouth/Throat: Oropharynx is clear and moist. No oropharyngeal exudate.  undentulous   Eyes: EOM are normal. Pupils are equal, round, and reactive to light.  Pale conjunctiva   Neck: Normal range of  motion. Neck supple.  Cardiovascular: Normal rate and regular rhythm.   Pulmonary/Chest: She is in respiratory distress. She has wheezes (diffuse expiratory). She has no rales. She exhibits no tenderness.  Abdominal: Soft. Bowel sounds are normal. She exhibits no distension. There is no tenderness. There is no rebound.  Musculoskeletal: She exhibits edema (+ 3 pitting edema to abdomen).  Neurological: She is alert and oriented to person, place, and time. No cranial nerve deficit.  Skin: Skin is warm. She is not diaphoretic.  Psychiatric: She has a normal mood and affect. Her behavior is normal.     Lab results: Basic Metabolic Panel:  Recent Labs  96/06/5399/02/15 1100  NA 142  K 4.1  CL 101  CO2 28  GLUCOSE 129*  BUN 44*  CREATININE 1.44*  CALCIUM 8.5   CBC:  Recent Labs  03/16/13 1100  WBC 4.4  NEUTROABS 3.5  HGB 9.3*  HCT 28.2*  MCV 94.3  PLT 74*   BNP:  Recent Labs  03/16/13 1100  PROBNP 6942.0*   Urinalysis:  Recent Labs  03/16/13 1204  COLORURINE YELLOW  LABSPEC 1.009  PHURINE 5.5  GLUCOSEU NEGATIVE  HGBUR NEGATIVE  BILIRUBINUR NEGATIVE  KETONESUR NEGATIVE  PROTEINUR NEGATIVE  UROBILINOGEN 1.0  NITRITE POSITIVE*  LEUKOCYTESUR MODERATE*    Imaging results:  Dg Chest Port 1 View  03/16/2013   CLINICAL DATA:  Short of breath, tachycardic  EXAM: PORTABLE CHEST - 1 VIEW  COMPARISON:  Prior chest x-ray 01/06/2013  FINDINGS: Stable cardiac and mediastinal contours. Coarse calcifications noted in the transverse aorta. There is a pulmonary vascular congestion with diffuse mild interstitial prominence consistent with mild interstitial edema. Prominence of the right infrahilar region is similar compared to prior radiographs. There is mild enlargement of the main and central pulmonary arteries. No acute osseous abnormality. Surgical changes of prior partial right clavicular resection. No large  pleural effusion. No pneumothorax.  IMPRESSION: Pulmonary vascular  congestion with mild interstitial edema concerning for CHF.   Electronically Signed   By: Malachy Moan M.D.   On: 03/16/2013 11:26    Other results:  EKG: Date: 03/16/2013  Rate: 91  Rhythm: normal sinus rhythm  QRS Axis: normal  Intervals: normal  ST/T Wave abnormalities: nonspecific ST/T changes  Conduction Disutrbances:left bundle branch block  Narrative Interpretation:  Old EKG Reviewed: unchanged  No significant change from 01/06/2013   Assessment & Plan by Problem: Active Problems:   DM   HYPERTENSION   Systolic CHF, acute on chronic (worsening LV function) EF of 20-25% on echo (12/16)   Normocytic anemia   CKD (chronic kidney disease) stage 3, GFR 30-59 ml/min  Assessment:  80 year old female with past medical history of combined CHF (last 2D-echo 20-25% on 02/28/12) due to ischemic cardiomyopathy and COPD on home oxygen who presents with shortness of breath, wheezing, and leg swelling and found to have CHF exacerbation.    Plan:  Acute Exacerbation of Combined CHF Exacerbation - Pt with dyspnea, LE edema, elevated pro-BNP (6942), and chest-xray pulmonary vascular congestion with mild interstitial edema concerning consistent with CHF exacerbation. Weight gain (155) from basline 145 lb earlier last week with IM lasix injection.  Last 2D-Echo with 20-25% on 02/28/12. Pt received IV 80 mg lasix in ED.  -Monitor daily weights and I &Os -Diurese in AM after volume status assessment  -Continue carvedilol 6.25 mg daily, hold if bradycardia  -Continue isosorbide mononitrate 30 mg daily  -Continue spironolactone 25 mg daily in AM -Cycle troponins, 12-lead EKG in AM   COPD  - Pt with dyspnea, chronic wheezing, cough, and chest tightness. Pt recently put on continuous home (2L oxygen) due to worsening dyspnea. Last exacerbation was 10/25 with ED admission requiring steroid administration. No change in sputum production or consistency to suggest COPD exacerbation and no  increased oxygen requirement (on home 2L O2). -Oxygen therapy as needed to keep SpO2>92% -Breathing treatment Q4h -IV Solumderol 80 mg Q8 hr x 2, then PO prednisone 60 mg x 4 days  -Consider Spiriva as outpatient for maintenance therapy   Paroxysmal Atrial Fibrillation - currently in sinus rhythm. Pt on carvedilol for rate control,  not on AC therapy due to chronic anemia. -Monitor on telemetry  -Continue carvedilol 6.25 mg BID  Uncomplicated UTI - Pt symptomatic with UA indicative of UTI ( +LE, + nitrite). Pt received IV 1 g ceftriaxone in ED -Awaiting urine cultures  -Ciprofloxacin 250 mg BID x 2 days   Chronic Normocytic Anemia - stable H/H with no active bleeding. Pt requires frequent blood and iron transfusions. Etiology thought to be due to lower GI blood loss (internal hemorrhoids, liver cirrhosis). Pt seen by hematology and etiology due to combination of iron-deficiency and ACD. Pt not on AC therapy.   -Continue to monitor for bleeding -Continue to monitor CBC  Chronic Thrombocytopenia - stable platelet count with no active bleeding.  Etiology thought to be due to liver cirrhosis. Pt with baseline platelet count of 70-80K -Continue to monitor for bleeding -Continue to monitor CBC  Non-insulin dependent Type II DM - Pt with last A1c of 6.5 on 02/26/13, not on hypoglycemic agents. Glucose on admission of 129.  -Monitor CBG   Chronic Kidney Disease Stage 3 - Pt with Cr of 1.44 on admission with baseline Cr 1.2-3. UA without hematuria. -Continue to monitor Cr   Chronic Pain -Pt with severe back pain requiring MS  contin and norco. Due to somnolence placed in nursing home [facility]) because family could no longer take care of her.    -Continue MS contin 15 mg BID  -Acetaminophen 650 mg PRN   Mood disorder - pt with major depressive disorder, anxiety, and mild cognitive impairment. Pt also with history of substance abuse.   -Continue citalopram 20 mg daily   -Continue donepezil  10 mg daily  -Ativan PRN anxiety   Diet: Regular  DVT Ppx: SQ heparin TID Code: DNR/DNI   Dispo: Disposition is deferred at this time, awaiting improvement of current medical problems. Anticipated discharge in approximately 1-2 day(s).   The patient does have a current PCP Jethro Bastos, MD) and does need an [facility] hospital follow-up appointment after discharge.  The patient does not have transportation limitations that hinder transportation to clinic appointments.  Signed: Otis Brace, MD 03/16/2013, 4:22 PM

## 2013-03-16 NOTE — ED Notes (Signed)
Pt provided diet coke. Waiting for bed assignment. Pt's husband is at bedside.

## 2013-03-16 NOTE — ED Notes (Signed)
Pt's [facility] RN, Gavin PoundDeborah to bedside to check on patient. Make aware pt will be admitted. Reports she will notify [facility].

## 2013-03-16 NOTE — ED Notes (Signed)
Pt's son arrived at bedside. Given update.

## 2013-03-16 NOTE — ED Notes (Signed)
Admitting MDs at bedside.

## 2013-03-16 NOTE — Plan of Care (Signed)
Problem: Phase I Progression Outcomes Goal: EF % per last Echo/documented,Core Reminder form on chart Outcome: Completed/Met Date Met:  03/16/13 02/28/12 EF 20 to 25 %

## 2013-03-16 NOTE — ED Notes (Signed)
Pt breathing appears to have improved. Decreased wheezing after breathing treatment. When stimulating patient, she opens her mouth wide and begins breathing loudly. At rest, pt respirations smooth and unlabored. Pt is 100% on 2 L.

## 2013-03-16 NOTE — Progress Notes (Signed)
Pt received from Ed per stretcher. VS done and placed on telemetry monitor. Oriented to room. Pt is High Fall Risk banded and using bed alarms. Instructed not to get up without assistance to prevent fall. Accompanied by family.

## 2013-03-16 NOTE — ED Provider Notes (Addendum)
CSN: [id number]     Arrival date & time 03/16/13  1037 History   First MD Initiated Contact with Patient 03/16/13 1046     Chief Complaint  Patient presents with  . Shortness of Breath   (Consider location/radiation/quality/duration/timing/severity/associated sxs/prior Treatment) Patient is a 80 y.o. female presenting with shortness of breath. The history is provided by the nursing home. The history is limited by the condition of the patient.  Shortness of Breath  level V caveat lies to the history patient has known dementia. From nursing facility. Patient is also hard of hearing and does not have her hearing aids.   Patient was sent in from nursing facility to short stay to have a blood transfusion which was scheduled. Patient was noted to be short of breath. They felt there was inspiratory wheezing and referred over to ED for further evaluation. Patient was tachypneic did have some wheezing upon arrival here a little tachycardic patient is normally on 2 L of oxygen at all times she is on that currently and her saturations are in the 90s.  Past Medical History  Diagnosis Date  . Alzheimer's disease   . 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 Chronic kidney disease, stage III (moderate)   . Unspecified vitamin D deficiency   . Arthritis     osteo  . Hypertension   . Hyperlipidemia   . Depression   . GERD (gastroesophageal reflux disease)   . Cataract   . Exudative senile macular degeneration of retina   . Retinal neovascularization NOS   . Chronic combined systolic and diastolic heart failure   . Non-ischemic cardiomyopathy     EF 20-25% 2D 12/13  . PAF (paroxysmal atrial fibrillation)   . Pure hypercholesterolemia   . Esophageal reflux   . COPD (chronic obstructive pulmonary disease)   . Primary localized osteoarthrosis, lower leg   . Lumbago   . Vitamin D deficiency   . Macular degeneration   . Chronic pain     back  . Aortic stenosis, mild     echo 12/13  .  Normal coronary arteries 10/09, 6/12  . LBBB (left bundle branch block)   . Microcytic anemia, endoscopy negative for PUD 03/01/12 02/28/2012   Past Surgical History  Procedure Laterality Date  . Knee surgery  1997  . Appendectomy  1949  . Tonsillectomy    . Esophagogastroduodenoscopy  03/01/2012    Procedure: ESOPHAGOGASTRODUODENOSCOPY (EGD);  Surgeon: Graylin Shiver, MD;  Location: [facility] ENDOSCOPY;  Service: Endoscopy;  Laterality: N/A;  . Abdominal hysterectomy  1970   Family History  Problem Relation Age of Onset  . Stroke Mother   . Heart disease Father   . Cancer Brother    History  Substance Use Topics  . Smoking status: Former Smoker    Quit date: 05/27/2007  . Smokeless tobacco: Never Used  . Alcohol Use: No   OB History   Grav Para Term Preterm Abortions TAB SAB Ect Mult Living                 Review of Systems  Unable to perform ROS Respiratory: Positive for shortness of breath.    level V caveat applies patient has a history of dementia.  Allergies  Darvon and Paroxetine  Home Medications   Current Outpatient Rx  Name  Route  Sig  Dispense  Refill  . albuterol (PROAIR HFA) 108 (90 BASE) MCG/ACT inhaler   Inhalation   Inhale 2 puffs into the lungs every  4 (four) hours as needed.   6.7 g   3   . albuterol (PROVENTIL) (2.5 MG/3ML) 0.083% nebulizer solution   Nebulization   Take 2.5 mg by nebulization every 6 (six) hours as needed for wheezing or shortness of breath.         . carvedilol (COREG) 6.25 MG tablet   Oral   Take 1 tablet (6.25 mg total) by mouth 2 (two) times daily with a meal.   60 tablet   2   . cholecalciferol (VITAMIN D) 1000 UNITS tablet   Oral   Take 1,000 Units by mouth daily.         . citalopram (CELEXA) 20 MG tablet   Oral   Take 20 mg by mouth daily.         Marland Kitchen. donepezil (ARICEPT) 10 MG tablet   Oral   Take 10 mg by mouth at bedtime as needed.         . furosemide (LASIX) 40 MG tablet   Oral   Take 1 tablet (40  mg total) by mouth 2 (two) times daily.   60 tablet   1   . HYDROcodone-acetaminophen (NORCO/VICODIN) 5-325 MG per tablet      1 by mouth four times daily as needed DO NOT EXCEED 3GM APAP/24HR   120 tablet   0   . ipratropium-albuterol (DUONEB) 0.5-2.5 (3) MG/3ML SOLN   Nebulization   Take 3 mLs by nebulization once.         . isosorbide mononitrate (IMDUR) 30 MG 24 hr tablet   Oral   Take 30 mg by mouth daily.         Marland Kitchen. LORazepam (ATIVAN) 0.5 MG tablet      Take one tablet by mouth at bedtime; Take one tablet by mouth daily as needed   60 tablet   5   . methylPREDNISolone sodium succinate (SOLU-MEDROL) 125 mg/2 mL injection   Intravenous   Inject 125 mg into the vein once.         . morphine (MS CONTIN) 15 MG 12 hr tablet      Take one tablet by mouth twice daily. Hold for sedation   60 tablet   0   . omeprazole (PRILOSEC) 20 MG capsule   Oral   Take 20 mg by mouth daily.         . predniSONE (DELTASONE) 20 MG tablet   Oral   Take 3 tablets (60 mg total) by mouth daily.   15 tablet   0   . PSYLLIUM PO   Oral   Take 3 capsules by mouth daily.         Marland Kitchen. spironolactone (ALDACTONE) 25 MG tablet   Oral   Take 1 tablet (25 mg total) by mouth daily.   30 tablet   1    BP 111/49  Pulse 86  Temp(Src) 98.2 F (36.8 C) (Oral)  Resp 16  SpO2 100% Physical Exam  Nursing note and vitals reviewed. Constitutional: She appears well-developed and well-nourished. She appears distressed.  HENT:  Head: Normocephalic and atraumatic.  Mouth/Throat: Oropharynx is clear and moist.  Eyes: Conjunctivae and EOM are normal. Pupils are equal, round, and reactive to light.  Neck: Normal range of motion.  Cardiovascular: Normal rate, regular rhythm and normal heart sounds.   Pulmonary/Chest: She is in respiratory distress. She has wheezes.  Abdominal: Soft. Bowel sounds are normal. There is no tenderness.  Neurological: She is alert. No cranial nerve  deficit. She  exhibits normal muscle tone. Coordination normal.  Skin: Skin is warm. No rash noted.    ED Course  Procedures (including critical care time) Labs Review Labs Reviewed  CBC WITH DIFFERENTIAL - Abnormal; Notable for the following:    RBC 2.99 (*)    Hemoglobin 9.3 (*)    HCT 28.2 (*)    Platelets 74 (*)    Neutrophils Relative % 78 (*)    Lymphocytes Relative 5 (*)    Lymphs Abs 0.2 (*)    Monocytes Relative 14 (*)    All other components within normal limits  BASIC METABOLIC PANEL - Abnormal; Notable for the following:    Glucose, Bld 129 (*)    BUN 44 (*)    Creatinine, Ser 1.44 (*)    GFR calc non Af Amer 33 (*)    GFR calc Af Amer 38 (*)    All other components within normal limits  PRO B NATRIURETIC PEPTIDE - Abnormal; Notable for the following:    Pro B Natriuretic peptide (BNP) 6942.0 (*)    All other components within normal limits  URINALYSIS, ROUTINE W REFLEX MICROSCOPIC - Abnormal; Notable for the following:    APPearance CLOUDY (*)    Nitrite POSITIVE (*)    Leukocytes, UA MODERATE (*)    All other components within normal limits  URINE MICROSCOPIC-ADD ON - Abnormal; Notable for the following:    Squamous Epithelial / LPF FEW (*)    Bacteria, UA MANY (*)    All other components within normal limits  URINE CULTURE  POCT I-STAT TROPONIN I   Imaging Review Dg Chest Port 1 View  03/16/2013   CLINICAL DATA:  Short of breath, tachycardic  EXAM: PORTABLE CHEST - 1 VIEW  COMPARISON:  Prior chest x-ray 01/06/2013  FINDINGS: Stable cardiac and mediastinal contours. Coarse calcifications noted in the transverse aorta. There is a pulmonary vascular congestion with diffuse mild interstitial prominence consistent with mild interstitial edema. Prominence of the right infrahilar region is similar compared to prior radiographs. There is mild enlargement of the main and central pulmonary arteries. No acute osseous abnormality. Surgical changes of prior partial right clavicular  resection. No large pleural effusion. No pneumothorax.  IMPRESSION: Pulmonary vascular congestion with mild interstitial edema concerning for CHF.   Electronically Signed   By: Malachy Moan M.D.   On: 03/16/2013 11:26    EKG Interpretation   None      Date: 03/16/2013  Rate: 91  Rhythm: normal sinus rhythm  QRS Axis: normal  Intervals: normal  ST/T Wave abnormalities: nonspecific ST/T changes  Conduction Disutrbances:left bundle branch block  Narrative Interpretation:   Old EKG Reviewed: unchanged No significant change from 01/06/2013  MDM   1. CHF (congestive heart failure)   2. UTI (lower urinary tract infection)    The patient was sent over from short stay she was there to have a blood transfusion today. At short stay patient noted to be short of breath sent over for evaluation. They states it is important expiratory wheezing. Here patient given albuterol Atrovent wheezing resolved the patient still short of breath chest x-rays consistent with mild pulmonary edema and/or CHF. Patient's urinalysis also consistent with urinary tract infection. Patient given 1 g Rocephin for that. Patient given 80 mg of Lasix IV for the pulmonary edema.  Discussed with her main doctor Dr. Modena Jansky we will go ahead and admit her to telemetry outpatient clinics will do that. Temporary orders placed. Patient is normally  on 2 L of nasal cannula oxygen around the clock and will be continued on that. Patient sats are good. Patient's troponin was negative patient's BNP is elevated consistent with CHF. Patient's mental status is baseline.   Shelda Jakes, MD 03/16/13 1431  Shelda Jakes, MD 03/16/13 [phone]

## 2013-03-16 NOTE — Progress Notes (Signed)
Pt had HR down to 31 SB call to MD held coreg MD will put order in for parameters on when to hold coreg. Pt asymptomatic- Talking to me and alert.

## 2013-03-16 NOTE — ED Notes (Signed)
Patient brought in from short stay.   Patient was supposed to receive transfusion today.  Patient became increasing short of breath.   Patient has inspiratory and expiratory wheezing.   Patient on 2L [place] round the clock.

## 2013-03-16 NOTE — Progress Notes (Signed)
[facility] with [facility]. Patient is audibly wheezing. Respirations labored. Arrived on O2 2 L via nasal cannula. Inspiratory and expiratory wheezing auscultated bilaterally.  Expiratory rales on left. Both feet edematous, right lower leg weeping yellowish fluid. Skin is pale. Patient states that she is having trouble catching her breath. Dr. Dorothe PeaKoehler was called by Margaret Pyleenee Richards, RN and condition was reported. Dr. Dorothe PeaKoehler ordered patient to got ED immediately. Charge nurse was called by Margaret Pyleenee Richards, RN and informed of patient condition and that she is on her way down to ED. Patient notified of plan to send to ED. Verbalizes understanding. Patient sent to ED via wheelchair with NT. Vital signs stable.

## 2013-03-16 NOTE — ED Notes (Signed)
Spoke with [facility] RN, Leotis ShamesLauren. Made aware pt is going to be admitted. Pt keeps requesting pain medication. Per Pt's son pt is only to get MS Contin 15 mg at 7 am and 7pm. RN at facility reports she did receive this pain medication this morning.

## 2013-03-17 DIAGNOSIS — J449 Chronic obstructive pulmonary disease, unspecified: Secondary | ICD-10-CM

## 2013-03-17 DIAGNOSIS — N183 Chronic kidney disease, stage 3 unspecified: Secondary | ICD-10-CM

## 2013-03-17 DIAGNOSIS — I5023 Acute on chronic systolic (congestive) heart failure: Secondary | ICD-10-CM

## 2013-03-17 LAB — COMPREHENSIVE METABOLIC PANEL
ALT: 14 U/L (ref 0–35)
AST: 24 U/L (ref 0–37)
Albumin: 2.6 g/dL — ABNORMAL LOW (ref 3.5–5.2)
Alkaline Phosphatase: 72 U/L (ref 39–117)
BILIRUBIN TOTAL: 1 mg/dL (ref 0.3–1.2)
BUN: 44 mg/dL — AB (ref 6–23)
CALCIUM: 8.2 mg/dL — AB (ref 8.4–10.5)
CO2: 29 meq/L (ref 19–32)
CREATININE: 1.46 mg/dL — AB (ref 0.50–1.10)
Chloride: 99 mEq/L (ref 96–112)
GFR, EST AFRICAN AMERICAN: 38 mL/min — AB (ref >90)
GFR, EST NON AFRICAN AMERICAN: 33 mL/min — AB (ref >90)
Glucose, Bld: 237 mg/dL — ABNORMAL HIGH (ref 70–99)
Potassium: 4.1 mEq/L (ref 3.7–5.3)
Sodium: 141 mEq/L (ref 137–147)
Total Protein: 5.5 g/dL — ABNORMAL LOW (ref 6.0–8.3)

## 2013-03-17 LAB — TROPONIN I

## 2013-03-17 LAB — GLUCOSE, CAPILLARY: Glucose-Capillary: 208 mg/dL — ABNORMAL HIGH (ref 70–99)

## 2013-03-17 LAB — MRSA PCR SCREENING: MRSA by PCR: POSITIVE — AB

## 2013-03-17 LAB — CBC
HEMATOCRIT: 25.3 % — AB (ref 36.0–46.0)
Hemoglobin: 8.1 g/dL — ABNORMAL LOW (ref 12.0–15.0)
MCH: 30.6 pg (ref 26.0–34.0)
MCHC: 32 g/dL (ref 30.0–36.0)
MCV: 95.5 fL (ref 78.0–100.0)
Platelets: 64 10*3/uL — ABNORMAL LOW (ref 150–400)
RBC: 2.65 MIL/uL — ABNORMAL LOW (ref 3.87–5.11)
RDW: 14.8 % (ref 11.5–15.5)
WBC: 2 10*3/uL — ABNORMAL LOW (ref 4.0–10.5)

## 2013-03-17 MED ORDER — IPRATROPIUM-ALBUTEROL 0.5-2.5 (3) MG/3ML IN SOLN
3.0000 mL | Freq: Four times a day (QID) | RESPIRATORY_TRACT | Status: DC
  Administered 2013-03-17 (×2): 3 mL via RESPIRATORY_TRACT
  Filled 2013-03-17 (×2): qty 3

## 2013-03-17 MED ORDER — CHLORHEXIDINE GLUCONATE CLOTH 2 % EX PADS
6.0000 | MEDICATED_PAD | Freq: Every day | CUTANEOUS | Status: DC
  Administered 2013-03-17 – 2013-03-19 (×3): 6 via TOPICAL

## 2013-03-17 MED ORDER — FUROSEMIDE 40 MG PO TABS
40.0000 mg | ORAL_TABLET | Freq: Every day | ORAL | Status: DC
  Filled 2013-03-17: qty 1

## 2013-03-17 MED ORDER — LISINOPRIL 2.5 MG PO TABS
2.5000 mg | ORAL_TABLET | Freq: Every day | ORAL | Status: DC
  Administered 2013-03-17 – 2013-03-19 (×3): 2.5 mg via ORAL
  Filled 2013-03-17 (×3): qty 1

## 2013-03-17 MED ORDER — FUROSEMIDE 40 MG PO TABS
40.0000 mg | ORAL_TABLET | Freq: Two times a day (BID) | ORAL | Status: DC
  Administered 2013-03-17 (×2): 40 mg via ORAL
  Filled 2013-03-17 (×5): qty 1

## 2013-03-17 MED ORDER — IPRATROPIUM-ALBUTEROL 0.5-2.5 (3) MG/3ML IN SOLN
3.0000 mL | RESPIRATORY_TRACT | Status: DC
  Administered 2013-03-17: 3 mL via RESPIRATORY_TRACT
  Filled 2013-03-17: qty 3

## 2013-03-17 MED ORDER — MUPIROCIN 2 % EX OINT
1.0000 "application " | TOPICAL_OINTMENT | Freq: Two times a day (BID) | CUTANEOUS | Status: DC
  Administered 2013-03-17 – 2013-03-19 (×6): 1 via NASAL
  Filled 2013-03-17: qty 22

## 2013-03-17 NOTE — Progress Notes (Addendum)
Subjective: [name] was seen and examined at bedside this morning. She is in good spirits, still heard on hearing with no hearing aids in place, and eating breakfast requiring tea. She denies any chest pain, N/V/D, fever, or chills. She is coughing intermittently but wheezing seems to improve. She denies any dysuria today.   Objective: Vital signs in last 24 hours: Filed Vitals:   03/17/13 0100 03/17/13 0430 03/17/13 0815 03/17/13 0842  BP: 131/53   102/50  Pulse: 45     Temp: 98.4 F (36.9 C)     TempSrc: Oral     Resp: 20     Height:      Weight:      SpO2: 99% 100% 100% 98%   Weight change:   Intake/Output Summary (Last 24 hours) at 03/17/13 1054 Last data filed at 03/17/13 1035  Gross per 24 hour  Intake    840 ml  Output   1530 ml  Net   -690 ml   Vitals reviewed. General: resting in bed, NAD HEENT: PERRL, EOMI, conjunctival pallor, hard on hearing Cardiac: RRR Pulm: mild basilar crackles b/l Abd: soft, nontender, nondistended, BS present Ext: warm, +3 pitting edema b/l lower extremities up to abdomen, mildly improved with less "weeping" of fluid from legs today Neuro: alert and oriented X3, cranial nerves II-XII grossly intact, strength equal in bilateral upper and lower extremities Skin: pale  Lab Results: Basic Metabolic Panel:  Recent Labs Lab 03/16/13 1100 03/17/13 0420  NA 142 141  K 4.1 4.1  CL 101 99  CO2 28 29  GLUCOSE 129* 237*  BUN 44* 44*  CREATININE 1.44* 1.46*  CALCIUM 8.5 8.2*   Liver Function Tests:  Recent Labs Lab 03/17/13 0420  AST 24  ALT 14  ALKPHOS 72  BILITOT 1.0  PROT 5.5*  ALBUMIN 2.6*   CBC:  Recent Labs Lab 03/16/13 1100 03/17/13 0420  WBC 4.4 2.0*  NEUTROABS 3.5  --   HGB 9.3* 8.1*  HCT 28.2* 25.3*  MCV 94.3 95.5  PLT 74* 64*   Cardiac Enzymes:  Recent Labs Lab 03/16/13 1915 03/16/13 2320  TROPONINI <0.30 <0.30   BNP:  Recent Labs Lab 03/16/13 1100  PROBNP 6942.0*   CBG:  Recent  Labs Lab 03/16/13 1618 03/17/13 0713  GLUCAP 112* 208*   Urinalysis:  Recent Labs Lab 03/16/13 1204  COLORURINE YELLOW  LABSPEC 1.009  PHURINE 5.5  GLUCOSEU NEGATIVE  HGBUR NEGATIVE  BILIRUBINUR NEGATIVE  KETONESUR NEGATIVE  PROTEINUR NEGATIVE  UROBILINOGEN 1.0  NITRITE POSITIVE*  LEUKOCYTESUR MODERATE*   Micro Results: Recent Results (from the past 240 hour(s))  URINE CULTURE     Status: None   Collection Time    03/16/13 12:04 PM      Result Value Range Status   Specimen Description URINE, RANDOM   Final   Special Requests NONE   Final   Culture  Setup Time     Final   Value: 03/16/2013 13:53     Performed at [facility]   Colony Count     Final   Value: >=100,000 COLONIES/ML     Performed at [facility]   Culture     Final   Value: GRAM NEGATIVE RODS     Performed at [facility]   Report Status PENDING   Incomplete  MRSA PCR SCREENING     Status: Abnormal   Collection Time    03/16/13  8:20 PM  Result Value Range Status   MRSA by PCR POSITIVE (*) NEGATIVE Final   Comment:            The GeneXpert MRSA Assay (FDA     approved for NASAL specimens     only), is one component of a     comprehensive MRSA colonization     surveillance program. It is not     intended to diagnose MRSA     infection nor to guide or     monitor treatment for     MRSA infections.     RESULT CALLED TO, READ BACK BY AND VERIFIED WITH:     R.COLUMBRES RN AT 0015 BY L.PITT 03/17/13   Studies/Results: Dg Chest Port 1 View  03/16/2013   CLINICAL DATA:  Short of breath, tachycardic  EXAM: PORTABLE CHEST - 1 VIEW  COMPARISON:  Prior chest x-ray 01/06/2013  FINDINGS: Stable cardiac and mediastinal contours. Coarse calcifications noted in the transverse aorta. There is a pulmonary vascular congestion with diffuse mild interstitial prominence consistent with mild interstitial edema. Prominence of the right infrahilar region is similar compared to prior  radiographs. There is mild enlargement of the main and central pulmonary arteries. No acute osseous abnormality. Surgical changes of prior partial right clavicular resection. No large pleural effusion. No pneumothorax.  IMPRESSION: Pulmonary vascular congestion with mild interstitial edema concerning for CHF.   Electronically Signed   By: Malachy Moan M.D.   On: 03/16/2013 11:26   Medications: I have reviewed the patient's current medications. Scheduled Meds: . carvedilol  6.25 mg Oral BID WC  . Chlorhexidine Gluconate Cloth  6 each Topical Q0600  . cholecalciferol  1,000 Units Oral Daily  . ciprofloxacin  250 mg Oral BID  . citalopram  20 mg Oral Daily  . donepezil  10 mg Oral QHS  . furosemide  40 mg Oral BID  . heparin subcutaneous  5,000 Units Subcutaneous Q8H  . ipratropium-albuterol  3 mL Nebulization Q4H  . isosorbide mononitrate  30 mg Oral Daily  . lisinopril  2.5 mg Oral Daily  . morphine  15 mg Oral Q12H  . mupirocin ointment  1 application Nasal BID  . pantoprazole  40 mg Oral Daily  . predniSONE  60 mg Oral Q breakfast  . sodium chloride  3 mL Intravenous Q12H  . spironolactone  25 mg Oral Daily   Continuous Infusions:  PRN Meds:.acetaminophen Assessment/Plan: Active Problems:   HYPERTENSION   Systolic CHF, acute on chronic (worsening LV function) EF of 20-25% on echo (12/16)   Normocytic anemia   DM   CKD (chronic kidney disease) stage 3, GFR 30-59 ml/min [name] is a 80 year old female with combined CHF, CKD stage 3, and HTN admitted for CHF exacerbation.   Acute Exacerbation of Combined CHF (EF 20-25%)--unclear cause for acute decompensation but improving with diuresis. Anasarca up to abdomen with mild improvement of lower extremity edema with IV lasix yesterday. ~1.5L output past 24 hours. Noted to be 155lb's earlier this week by PCP and weight today remains the same.  Baseline weight per PCP ~145lb. On PO Lasix 40mg  qd at [facility], records unclear.  -Monitor  daily weights and I &Os  -will transition to PO lasix today to continue diuresis and likely d/c home on po lasix 40mg  qd and spironolactone with close pcp follow up -Continue carvedilol 6.25 mg daily, hold if bradycardia, and IMDUR -Continue spironolactone -trop x3 negative -will start low dose ACEi, 2.5mg  qd given underlying  ischemic cardiomyopathy  COPD--no PFTs per chart review. Chronic wheezing and has intermittent dry cough, no sputum production. Apparently on continuous home O2 (2L) since in NH for ?worsening dyspnea. No respiratory distress during hospital course, initially wheezing now much improved after diuresis and with steroid treatment.   -check pulse ox with ambulation -continue duonebz -Continue PO prednisone 60 mg x 4 more days  -Consider ADVAIR as outpatient for maintenance therapy   Paroxysmal Atrial Fibrillation--remains in sinus rhythm. Pt on carvedilol for rate control, not on AC therapy due to chronic anemia.  -Continue carvedilol 6.25 mg BID   Uncomplicated UTI--improving. Received Rocephin IV x1 dose yesterday and transitioned to PO abx today for 3 day course.  -urine cx pending, >100,000 colonies, gram negative rods  -Ciprofloxacin 250 mg BID x 2 more days  -d/c foley  Chronic Normocytic Anemia - stable, was initially transferred to [facility] for blood transfusion but not started due to respiratory status.  Hb 9.3 on admission and stable with no signs of active bleeding, thus will not proceed with any transfusion at this time. Per documentation, she requires frequent blood and iron transfusions. Etiology unknown or multifactorial with lower GI blood loss (internal hemorrhoids) vs. liver cirrhosis vs. Myelodysplastic syndrome (has refused biopsy in the past).  -Continue to monitor CBC   Chronic Thrombocytopenia--Etiology thought to be due to liver cirrhosis. Pt with baseline platelet count of 70-80K  -Continue to monitor CBC, down to 64 from 74 on admission   Chronic  Kidney Disease Stage 3--stable.  Cr of 1.44 on admission with baseline Cr 1.2-3. GFR 33.  -Continue to monitor Cr   Chronic back pain--demands her pain medications on time.  Hx of somnolence with excessive medications but tolerates BID MS contin well and occasionally Norco 5-325mg  as extra dose if needed at NH.  -Continue MS contin 15 mg BID   Mood disorder--stable, hx of major depressive disorder, anxiety, and mild cognitive impairment.  -Continue citalopram and donepezil  Diet: Regular  DVT Ppx: SQ heparin TID  Code: DNR/DNI  Dispo: Disposition is deferred at this time, awaiting improvement of current medical problems.  Anticipated discharge in approximately 1 day(s).   The patient does have a current PCP Jethro Bastos, MD) and does not need an [facility] hospital follow-up appointment after discharge.  The patient does not have transportation limitations that hinder transportation to clinic appointments.  .Services Needed at time of discharge: Y = Yes, Blank = No PT:   OT:   RN:   Equipment:   Other:     LOS: 1 day   Darden Palmer, MD 03/17/2013, 10:54 AM

## 2013-03-17 NOTE — H&P (Signed)
Internal Medicine Attending Admission Note Date: 03/17/2013  Patient name: [name] Medical record number: [id number] Date of birth: 07/10/1929 Age: 80 y.o. Gender: female  I saw and evaluated the patient. I reviewed the resident's note and I agree with the resident's findings and plan as documented in the resident's note.  [name] is an 80 year old woman with a past medical history of ischemic cardiomyopathy with a left ventricular ejection fraction of 20-25%, chronic obstructive pulmonary disease requiring home oxygen therapy, transfusion-dependent anemia, hypertension, and paroxysmal atrial fibrillation who presents to the emergency department with a two-day history of shortness of breath and wheezing above her baseline. Of note, last week her weight was 155 pounds per her report and she was given intramuscular furosemide. Her weight decreased to 136 on admission. This increase in weight occurred despite her being compliant with her Lasix at 40 mg twice a day. Over the last 2 days she's noted increasing edema in both legs now with weeping. Other than a cough productive of white phlegm and some mild wheezing she is without other complaints, specifically denying fevers, chills, body aches, and abdominal pain with the exception of some burning with urination. She was admitted to the internal medicine teaching service for further care.  Of note her BNP was 6942 which is lower than previous measurements. As previously mentioned her weight was down to 136. Her exam revealed anasarca to the belly. This morning her lung exam revealed no wheezing but bibasilar crackles. She felt much improved after the dose of IV Lasix she received yesterday.  [name] was admitted with decompensated ischemic cardiomyopathy. It is unclear why she had this decompensation. She responded well to a dose of IV lasix and is feeling much improved.  We will switch her to oral Lasix today in preparation for likely discharge to  the nursing home tomorrow. We will also start a very low dose ACE inhibitor, specifically lisinopril 2.5 mg daily, given her underlying ischemic cardiomyopathy. With regards to her COPD, we do not believe she is exacerbated and we will continue with her bronchodilators. Her other chronic medical conditions will be treated with her outpatient regimen. I anticipate she will be ready for discharge in the next 24-48 hours.

## 2013-03-17 NOTE — Progress Notes (Signed)
SATURATION QUALIFICATIONS: (This note is used to comply with regulatory documentation for home oxygen)  Patient Saturations on Room Air at Rest = 100 %  Patient Saturations on Room Air while Ambulating = 98%  Patient Saturations on0 Liters of oxygen while Ambulating 94 %  Please briefly explain why patient needs home oxygen:

## 2013-03-17 NOTE — Progress Notes (Signed)
Pt hasd episodes of HR on the upper 30's.pt asymptomatic. Foley in place and draining. Noted site where blood was drawn rt ac bleeding linens changed. Offered no compliants breathing easy and regular at 2lnc. continnued to observe

## 2013-03-18 LAB — URINE CULTURE: Colony Count: >=100000

## 2013-03-18 LAB — TYPE AND SCREEN
ABO/RH(D): A POS
Antibody Screen: NEGATIVE
Unit division: 0

## 2013-03-18 LAB — GLUCOSE, CAPILLARY: GLUCOSE-CAPILLARY: 208 mg/dL — AB (ref 70–99)

## 2013-03-18 LAB — BASIC METABOLIC PANEL
BUN: 47 mg/dL — ABNORMAL HIGH (ref 6–23)
CHLORIDE: 98 meq/L (ref 96–112)
CO2: 26 mEq/L (ref 19–32)
CREATININE: 1.46 mg/dL — AB (ref 0.50–1.10)
Calcium: 8.6 mg/dL (ref 8.4–10.5)
GFR calc Af Amer: 38 mL/min — ABNORMAL LOW (ref >90)
GFR calc non Af Amer: 33 mL/min — ABNORMAL LOW (ref >90)
GLUCOSE: 175 mg/dL — AB (ref 70–99)
Potassium: 4.3 mEq/L (ref 3.7–5.3)
Sodium: 137 mEq/L (ref 137–147)

## 2013-03-18 LAB — MAGNESIUM: MAGNESIUM: 1.8 mg/dL (ref 1.5–2.5)

## 2013-03-18 MED ORDER — FUROSEMIDE 10 MG/ML IJ SOLN
80.0000 mg | Freq: Once | INTRAMUSCULAR | Status: AC
  Administered 2013-03-18: 10:00:00 80 mg via INTRAVENOUS
  Filled 2013-03-18: qty 8

## 2013-03-18 MED ORDER — IPRATROPIUM-ALBUTEROL 0.5-2.5 (3) MG/3ML IN SOLN: 3.0000 mL | RESPIRATORY_TRACT | Status: DC | PRN

## 2013-03-18 MED ORDER — IPRATROPIUM-ALBUTEROL 0.5-2.5 (3) MG/3ML IN SOLN
3.0000 mL | Freq: Four times a day (QID) | RESPIRATORY_TRACT | Status: DC
  Administered 2013-03-18 – 2013-03-19 (×6): 3 mL via RESPIRATORY_TRACT
  Filled 2013-03-18 (×5): qty 3

## 2013-03-18 NOTE — Progress Notes (Signed)
Subjective: [name] feels as though her breathing is much better than yesterday. She tells me her swelling is somewhat worse than it is at baseline, though not worsening since her admission. No other complaints. She was about to receive breathing treatment when I was in the room.  Objective: Vital signs in last 24 hours: Filed Vitals:   03/17/13 2045 03/17/13 2223 03/18/13 0545 03/18/13 0807  BP:  132/53 152/84   Pulse:  76 74   Temp:  98.2 F (36.8 C) 96.9 F (36.1 C)   TempSrc:  Oral Oral   Resp:  18 20   Height:      Weight:   71.169 kg (156 lb 14.4 oz)   SpO2: 96% 95% 93% 100%   Weight change: 8.8 kg (19 lb 6.4 oz) Weight is up .4lbs since yesterday  Intake/Output Summary (Last 24 hours) at 03/18/13 0854 Last data filed at 03/18/13 0607  Gross per 24 hour  Intake   1060 ml  Output    438 ml  Net    622 ml   Physical Exam General: sitting on edge of bed, very pleasant, answering questions appropriately  HEENT: NCAT, PERRL,  hearing aid in place to L ear Cardiac: RRR Pulm: decreased breath sounds, CTAB Abd: soft, nontender, nondistended, BS present Ext: warm extremities bilaterally, 3+ pitting edema b/l lower extremities up to upper thighs Neuro: alert and oriented X3, cranial nerves II-XII grossly intact, moving all extremities spontaneously   Lab Results: Basic Metabolic Panel:  Recent Labs Lab 03/17/13 0420 03/18/13 0440  NA 141 137  K 4.1 4.3  CL 99 98  CO2 29 26  GLUCOSE 237* 175*  BUN 44* 47*  CREATININE 1.46* 1.46*  CALCIUM 8.2* 8.6  MG  --  1.8   Liver Function Tests:  Recent Labs Lab 03/17/13 0420  AST 24  ALT 14  ALKPHOS 72  BILITOT 1.0  PROT 5.5*  ALBUMIN 2.6*   CBC:  Recent Labs Lab 03/16/13 1100 03/17/13 0420  WBC 4.4 2.0*  NEUTROABS 3.5  --   HGB 9.3* 8.1*  HCT 28.2* 25.3*  MCV 94.3 95.5  PLT 74* 64*   Cardiac Enzymes:  Recent Labs Lab 03/16/13 1915 03/16/13 2320  TROPONINI <0.30 <0.30   BNP:  Recent  Labs Lab 03/16/13 1100  PROBNP 6942.0*   CBG:  Recent Labs Lab 03/16/13 1618 03/17/13 0713 03/18/13 0639  GLUCAP 112* 208* 208*   Urinalysis:  Recent Labs Lab 03/16/13 1204  COLORURINE YELLOW  LABSPEC 1.009  PHURINE 5.5  GLUCOSEU NEGATIVE  HGBUR NEGATIVE  BILIRUBINUR NEGATIVE  KETONESUR NEGATIVE  PROTEINUR NEGATIVE  UROBILINOGEN 1.0  NITRITE POSITIVE*  LEUKOCYTESUR MODERATE*   Micro Results: Recent Results (from the past 240 hour(s))  URINE CULTURE     Status: None   Collection Time    03/16/13 12:04 PM      Result Value Range Status   Specimen Description URINE, RANDOM   Final   Special Requests NONE   Final   Culture  Setup Time     Final   Value: 03/16/2013 13:53     Performed at [facility]   Colony Count     Final   Value: >=100,000 COLONIES/ML     Performed at [facility]   Culture     Final   Value: GRAM NEGATIVE RODS     Performed at [facility]   Report Status PENDING   Incomplete  MRSA PCR SCREENING  Status: Abnormal   Collection Time    03/16/13  8:20 PM      Result Value Range Status   MRSA by PCR POSITIVE (*) NEGATIVE Final   Comment:            The GeneXpert MRSA Assay (FDA     approved for NASAL specimens     only), is one component of a     comprehensive MRSA colonization     surveillance program. It is not     intended to diagnose MRSA     infection nor to guide or     monitor treatment for     MRSA infections.     RESULT CALLED TO, READ BACK BY AND VERIFIED WITH:     R.COLUMBRES RN AT 0015 BY L.PITT 03/17/13   Studies/Results: Dg Chest Port 1 View  03/16/2013   CLINICAL DATA:  Short of breath, tachycardic  EXAM: PORTABLE CHEST - 1 VIEW  COMPARISON:  Prior chest x-ray 01/06/2013  FINDINGS: Stable cardiac and mediastinal contours. Coarse calcifications noted in the transverse aorta. There is a pulmonary vascular congestion with diffuse mild interstitial prominence consistent with mild interstitial edema.  Prominence of the right infrahilar region is similar compared to prior radiographs. There is mild enlargement of the main and central pulmonary arteries. No acute osseous abnormality. Surgical changes of prior partial right clavicular resection. No large pleural effusion. No pneumothorax.  IMPRESSION: Pulmonary vascular congestion with mild interstitial edema concerning for CHF.   Electronically Signed   By: Malachy Moan M.D.   On: 03/16/2013 11:26   Medications: I have reviewed the patient's current medications. Scheduled Meds: . carvedilol  6.25 mg Oral BID WC  . Chlorhexidine Gluconate Cloth  6 each Topical Q0600  . cholecalciferol  1,000 Units Oral Daily  . ciprofloxacin  250 mg Oral BID  . citalopram  20 mg Oral Daily  . donepezil  10 mg Oral QHS  . furosemide  40 mg Oral BID  . heparin subcutaneous  5,000 Units Subcutaneous Q8H  . ipratropium-albuterol  3 mL Nebulization QID  . isosorbide mononitrate  30 mg Oral Daily  . lisinopril  2.5 mg Oral Daily  . morphine  15 mg Oral Q12H  . mupirocin ointment  1 application Nasal BID  . pantoprazole  40 mg Oral Daily  . predniSONE  60 mg Oral Q breakfast  . sodium chloride  3 mL Intravenous Q12H  . spironolactone  25 mg Oral Daily   Continuous Infusions:  PRN Meds:.acetaminophen, ipratropium-albuterol  Assessment/Plan: [name] is a 80 year old female with combined CHF, CKD stage 3, and HTN admitted for CHF exacerbation.   Acute Exacerbation of Combined CHF (EF 20-25%)- Improved breathing today, with somewhat improved BLE swelling. Pt ambulated on room air yesterday w/o desatting. Pt s/p lasix 80mg  IV on day of admission and now on lasix 40mg  PO BID--pt up approx 1lb today (baseline weight per PCP 145lbs, today weight 156). Dosing of lasix at her NH (lasix 40mg  PO daily?) are somewhat unclear. Will need to address this issue prior to her discharge. -daily weights -strict I/Os -give lasix 80mg  IV once today, with plan to d/c on  lasix 40mg  BID tomorrow -continue spironolactone 25mg  daily -continue lisinopril 2.5mg  daily (BP seems to be tolerating)  COPD--no PFTs per chart review. Chronic wheezing and has intermittent dry cough, no sputum production- wheezing resolved today. Apparently on continuous home O2 (2L) since in NH for ?worsening dyspnea. She did not desat  w/ ambulation on room air yesterday, so not sure if patient requires home O2. -continue duonebs -Continue PO prednisone 60 mg x 4 more days  -Consider ADVAIR as outpatient for maintenance therapy   Paroxysmal Atrial Fibrillation--remains in sinus rhythm. Pt on carvedilol for rate control, not on AC therapy due to chronic anemia.  -Continue carvedilol 6.25 mg BID   Uncomplicated UTI--Grew gram neg rods in urine. Received Rocephin IV x1 dose in ED and transitioned to PO abx today for 3 day course- last day today. -urine cx- >100,000 colonies, gram negative rods  -Ciprofloxacin 250 mg BID, today last day  Chronic Normocytic Anemia - stable. Per documentation, she requires frequent blood and iron transfusions. Etiology unknown or multifactorial with lower GI blood loss (internal hemorrhoids) vs. liver cirrhosis vs. Myelodysplastic syndrome (has refused biopsy in the past).   Chronic Thrombocytopenia--Etiology thought to be due to liver cirrhosis. Pt with baseline platelet count of 70-80K. Down to 64 from 65 on admission. Did not recheck CBC this AM.   Chronic Kidney Disease Stage 3--stable.  Cr of 1.44 on admission with baseline Cr 1.2-3. GFR 33.  -Continue to monitor Cr   Chronic back pain--demands her pain medications on time.  Hx of somnolence with excessive medications but tolerates BID MS contin well and occasionally Norco 5-325mg  as extra dose if needed at NH.  -Continue MS contin 15 mg BID   Mood disorder--stable, hx of major depressive disorder, anxiety, and mild cognitive impairment.  -Continue citalopram and donepezil  Diet: Regular   DVT Ppx:  SQ heparin TID   Code: DNR/DNI  Dispo: Discharge likely tomorrow.   The patient does have a current PCP Jethro Bastos, MD) and does not need an [facility] hospital follow-up appointment after discharge.  The patient does not have transportation limitations that hinder transportation to clinic appointments.  .Services Needed at time of discharge: Y = Yes, Blank = No PT:   OT:   RN:   Equipment:   Other:     LOS: 2 days   Windell Hummingbird, MD 03/18/2013, 8:54 AM

## 2013-03-19 DIAGNOSIS — N390 Urinary tract infection, site not specified: Secondary | ICD-10-CM | POA: Diagnosis present

## 2013-03-19 LAB — BASIC METABOLIC PANEL
BUN: 58 mg/dL — ABNORMAL HIGH (ref 6–23)
CO2: 28 mEq/L (ref 19–32)
Calcium: 8.3 mg/dL — ABNORMAL LOW (ref 8.4–10.5)
Chloride: 99 mEq/L (ref 96–112)
Creatinine, Ser: 1.65 mg/dL — ABNORMAL HIGH (ref 0.50–1.10)
GFR calc Af Amer: 33 mL/min — ABNORMAL LOW (ref >90)
GFR, EST NON AFRICAN AMERICAN: 28 mL/min — AB (ref >90)
Glucose, Bld: 171 mg/dL — ABNORMAL HIGH (ref 70–99)
POTASSIUM: 4 meq/L (ref 3.7–5.3)
Sodium: 140 mEq/L (ref 137–147)

## 2013-03-19 LAB — GLUCOSE, CAPILLARY: GLUCOSE-CAPILLARY: 170 mg/dL — AB (ref 70–99)

## 2013-03-19 MED ORDER — FUROSEMIDE 40 MG PO TABS: 40.0000 mg | ORAL_TABLET | Freq: Two times a day (BID) | ORAL | Status: AC

## 2013-03-19 MED ORDER — FUROSEMIDE 40 MG PO TABS
40.0000 mg | ORAL_TABLET | Freq: Once | ORAL | Status: AC
  Administered 2013-03-19: 14:00:00 40 mg via ORAL
  Filled 2013-03-19: qty 1

## 2013-03-19 MED ORDER — LISINOPRIL 2.5 MG PO TABS: 2.5000 mg | ORAL_TABLET | Freq: Every day | ORAL | Status: AC

## 2013-03-19 MED ORDER — PREDNISONE 20 MG PO TABS: 60.0000 mg | ORAL_TABLET | Freq: Every day | ORAL | Status: AC

## 2013-03-19 NOTE — Plan of Care (Signed)
Problem: Consults Goal: Heart Failure Patient Education (See Patient Education module for education specifics.)  Outcome: Not Applicable Date Met:  23/76/28 Pt has some dementia lives at [facility] facility and does not care for herself as in preparing meals & they dispense meds. Family is aware when they bring her food to have it low sodium.

## 2013-03-19 NOTE — Progress Notes (Signed)
Patient pulled IV out.  Anticipated discharge today. MD on call notified. No new IV placed at this time. RN will continue to monitor. Louretta ParmaAshley Herold Salguero, RN

## 2013-03-19 NOTE — Progress Notes (Signed)
Family called  To check on pt. Stated that they noticed that when they came to visit on Sunday that her 1 hearing aid on the left was missing. Called security to ask if anyone had turned in a hearing aid and security told me that they called the laundry and that no hearing aid had been found. Call back to family given phone # for risk management for follow up.

## 2013-03-19 NOTE — Discharge Instructions (Signed)
Please take lasix 40mg  twice per day and follow up with your primary care doctor within 1 week.  Please take prednisone 60mg  daily for 2 more days (starting 1/6)  Heart Failure Heart failure is a condition in which the heart has trouble pumping blood. This means your heart does not pump blood efficiently for your body to work well. In some cases of heart failure, fluid may back up into your lungs or you may have swelling (edema) in your lower legs. Heart failure is usually a long-term (chronic) condition. It is important for you to take good care of yourself and follow your caregiver's treatment plan. CAUSES  Some health conditions can cause heart failure. Those health conditions include:  High blood pressure (hypertension) causes the heart muscle to work harder than normal. When pressure in the blood vessels is high, the heart needs to pump (contract) with more force in order to circulate blood throughout the body. High blood pressure eventually causes the heart to become stiff and weak.  Coronary artery disease (CAD) is the buildup of cholesterol and fat (plaque) in the arteries of the heart. The blockage in the arteries deprives the heart muscle of oxygen and blood. This can cause chest pain and may lead to a heart attack. High blood pressure can also contribute to CAD.  Heart attack (myocardial infarction) occurs when 1 or more arteries in the heart become blocked. The loss of oxygen damages the muscle tissue of the heart. When this happens, part of the heart muscle dies. The injured tissue does not contract as well and weakens the heart's ability to pump blood.  Abnormal heart valves can cause heart failure when the heart valves do not open and close properly. This makes the heart muscle pump harder to keep the blood flowing.  Heart muscle disease (cardiomyopathy or myocarditis) is damage to the heart muscle from a variety of causes. These can include drug or alcohol abuse, infections, or  unknown reasons. These can increase the risk of heart failure.  Lung disease makes the heart work harder because the lungs do not work properly. This can cause a strain on the heart, leading it to fail.  Diabetes increases the risk of heart failure. High blood sugar contributes to high fat (lipid) levels in the blood. Diabetes can also cause slow damage to tiny blood vessels that carry important nutrients to the heart muscle. When the heart does not get enough oxygen and food, it can cause the heart to become weak and stiff. This leads to a heart that does not contract efficiently.  Other conditions can contribute to heart failure. These include abnormal heart rhythms, thyroid problems, and low blood counts (anemia). Certain unhealthy behaviors can increase the risk of heart failure. Those unhealthy behaviors include:  Being overweight.  Smoking or chewing tobacco.  Eating foods high in fat and cholesterol.  Abusing illicit drugs or alcohol.  Lacking physical activity. SYMPTOMS  Heart failure symptoms may vary and can be hard to detect. Symptoms may include:  Shortness of breath with activity, such as climbing stairs.  Persistent cough.  Swelling of the feet, ankles, legs, or abdomen.  Unexplained weight gain.  Difficulty breathing when lying flat (orthopnea).  Waking from sleep because of the need to sit up and get more air.  Rapid heartbeat.  Fatigue and loss of energy.  Feeling lightheaded, dizzy, or close to fainting.  Loss of appetite.  Nausea.  Increased urination during the night (nocturia). DIAGNOSIS  A diagnosis of heart  failure is based on your history, symptoms, physical examination, and diagnostic tests. Diagnostic tests for heart failure may include:  Echocardiography.  Electrocardiography.  Chest X-ray.  Blood tests.  Exercise stress test.  Cardiac angiography.  Radionuclide scans. TREATMENT  Treatment is aimed at managing the symptoms of  heart failure. Medicines, behavioral changes, or surgical intervention may be necessary to treat heart failure.  Medicines to help treat heart failure may include:  Angiotensin-converting enzyme (ACE) inhibitors. This type of medicine blocks the effects of a blood protein called angiotensin-converting enzyme. ACE inhibitors relax (dilate) the blood vessels and help lower blood pressure.  Angiotensin receptor blockers. This type of medicine blocks the actions of a blood protein called angiotensin. Angiotensin receptor blockers dilate the blood vessels and help lower blood pressure.  Water pills (diuretics). Diuretics cause the kidneys to remove salt and water from the blood. The extra fluid is removed through urination. This loss of extra fluid lowers the volume of blood the heart pumps.  Beta blockers. These prevent the heart from beating too fast and improve heart muscle strength.  Digitalis. This increases the force of the heartbeat.  Healthy behavior changes include:  Obtaining and maintaining a healthy weight.  Stopping smoking or chewing tobacco.  Eating heart healthy foods.  Limiting or avoiding alcohol.  Stopping illicit drug use.  Physical activity as directed by your caregiver.  Surgical treatment for heart failure may include:  A procedure to open blocked arteries, repair damaged heart valves, or remove damaged heart muscle tissue.  A pacemaker to improve heart muscle function and control certain abnormal heart rhythms.  An internal cardioverter defibrillator to treat certain serious abnormal heart rhythms.  A left ventricular assist device to assist the pumping ability of the heart. HOME CARE INSTRUCTIONS   Take your medicine as directed by your caregiver. Medicines are important in reducing the workload of your heart, slowing the progression of heart failure, and improving your symptoms.  Do not stop taking your medicine unless directed by your caregiver.  Do  not skip any dose of medicine.  Refill your prescriptions before you run out of medicine. Your medicines are needed every day.  Take over-the-counter medicine only as directed by your caregiver or pharmacist.  Engage in moderate physical activity if directed by your caregiver. Moderate physical activity can benefit some people. The elderly and people with severe heart failure should consult with a caregiver for physical activity recommendations.  Eat heart healthy foods. Food choices should be free of trans fat and low in saturated fat, cholesterol, and salt (sodium). Healthy choices include fresh or frozen fruits and vegetables, fish, lean meats, legumes, fat-free or low-fat dairy products, and whole grain or high fiber foods. Talk to a dietitian to learn more about heart healthy foods.  Limit sodium if directed by your caregiver. Sodium restriction may reduce symptoms of heart failure in some people. Talk to a dietitian to learn more about heart healthy seasonings.  Use healthy cooking methods. Healthy cooking methods include roasting, grilling, broiling, baking, poaching, steaming, or stir-frying. Talk to a dietitian to learn more about healthy cooking methods.  Limit fluids if directed by your caregiver. Fluid restriction may reduce symptoms of heart failure in some people.  Weigh yourself every day. Daily weights are important in the early recognition of excess fluid. You should weigh yourself every morning after you urinate and before you eat breakfast. Wear the same amount of clothing each time you weigh yourself. Record your daily weight. Provide your  caregiver with your weight record.  Monitor and record your blood pressure if directed by your caregiver.  Check your pulse if directed by your caregiver.  Lose weight if directed by your caregiver. Weight loss may reduce symptoms of heart failure in some people.  Stop smoking or chewing tobacco. Nicotine makes your heart work harder by  causing your blood vessels to constrict. Do not use nicotine gum or patches before talking to your caregiver.  Schedule and attend follow-up visits as directed by your caregiver. It is important to keep all your appointments.  Limit alcohol intake to no more than 1 drink per day for nonpregnant women and 2 drinks per day for men. Drinking more than that is harmful to your heart. Tell your caregiver if you drink alcohol several times a week. Talk with your caregiver about whether alcohol is safe for you. If your heart has already been damaged by alcohol or you have severe heart failure, drinking alcohol should be stopped completely.  Stop illicit drug use.  Stay up-to-date with immunizations. It is especially important to prevent respiratory infections through current pneumococcal and influenza immunizations.  Manage other health conditions such as hypertension, diabetes, thyroid disease, or abnormal heart rhythms as directed by your caregiver.  Learn to manage stress.  Plan rest periods when fatigued.  Learn strategies to manage high temperatures. If the weather is extremely hot:  Avoid vigorous physical activity.  Use air conditioning or fans or seek a cooler location.  Avoid caffeine and alcohol.  Wear loose-fitting, lightweight, and light-colored clothing.  Learn strategies to manage cold temperatures. If the weather is extremely cold:  Avoid vigorous physical activity.  Layer clothes.  Wear mittens or gloves, a hat, and a scarf when going outside.  Avoid alcohol.  Obtain ongoing education and support as needed.  Participate or seek rehabilitation as needed to maintain or improve independence and quality of life. SEEK MEDICAL CARE IF:   Your weight increases by 03 lb/1.4 kg in 1 day or 05 lb/2.3 kg in a week.  You have increasing shortness of breath that is unusual for you.  You are unable to participate in your usual physical activities.  You tire easily.  You  cough more than normal, especially with physical activity.  You have any or more swelling in areas such as your hands, feet, ankles, or abdomen.  You are unable to sleep because it is hard to breathe.  You feel like your heart is beating fast (palpitations).  You become dizzy or lightheaded upon standing up. SEEK IMMEDIATE MEDICAL CARE IF:   You have difficulty breathing.  There is a change in mental status such as decreased alertness or difficulty with concentration.  You have a pain or discomfort in your chest.  You have an episode of fainting (syncope). MAKE SURE YOU:   Understand these instructions.  Will watch your condition.  Will get help right away if you are not doing well or get worse. Document Released: 03/01/2005 Document Revised: 06/26/2012 Document Reviewed: 03/23/2012 [facility] Patient Information 2014 [place], [place].   Chronic Obstructive Pulmonary Disease Chronic obstructive pulmonary disease (COPD) is a condition in which airflow from the lungs is restricted. The lungs can never return to normal, but there are measures you can take which will improve them and make you feel better. CAUSES   Smoking.  Exposure to secondhand smoke.  Breathing in irritants such as air pollution, dust, cigarette smoke, strong odors, aerosol sprays, or paint fumes.  History of lung  infections. SYMPTOMS   Deep, persistent (chronic) cough with a large amount of thick mucus.  Wheezing.  Shortness of breath, especially with physical activity.  Feeling like you cannot get enough air.  Difficulty breathing.  Rapid breaths (tachypnea).  Gray or bluish discoloration (cyanosis) of the skin, especially in fingers, toes, or lips.  Fatigue.  Weight loss.  Swelling in legs, ankles, or feet.  Fast heartbeat (tachycardia).  Frequent lung infections.   Chest tightness. DIAGNOSIS  Initial diagnosis may be based on your history, symptoms, and physical examination.  Additional tests for COPD may include:  Chest X-ray.  Computed tomography (CT) scan.  Lung (pulmonary) function tests.  Blood tests. TREATMENT  Treatment focuses on making you comfortable (supportive care). Your caregiver may prescribe medicines (inhaled or pills) to help improve your breathing. Additional treatment options may include oxygen therapy and pulmonary rehabilitation. Treatment should also include reducing your exposure to known irritants and following a plan to stop smoking. HOME CARE INSTRUCTIONS   Take all medicines, including antibiotic medicines, as directed by your caregiver.  Use inhaled medicines as directed by your caregiver.  Avoid medicines or cough syrups that dry up your airway (antihistamines) and slow down the elimination of secretions. This decreases respiratory capacity and may lead to infections.  If you smoke, stop smoking.  Avoid exposure to smoke, chemicals, and fumes that aggravate your breathing.  Avoid contact with individuals that have a contagious illness.  Avoid extreme temperature and humidity changes.  Use humidifiers at home and at your bedside if they do not make breathing difficult.  Drink enough water and fluids to keep your urine clear or pale yellow. This loosens secretions.  Eat healthy foods. Eating smaller, more frequent meals and resting before meals may help you maintain your strength.  Ask your caregiver about the use of vitamins and mineral supplements.  Stay active. Exercise and physical activity will help maintain your ability to do things you want to do.  Balance activity with periods of rest.  Assume a position of comfort if you become short of breath.  Learn and use relaxation techniques.  Learn and use controlled breathing techniques as directed by your caregiver. Controlled breathing techniques include:  Pursed lip breathing. This breathing technique starts with breathing in (inhaling) through your nose for 1  second. Next, purse your lips as if you were going to whistle. Then breathe out (exhale) through the pursed lips for 2 seconds.  Diaphragmatic breathing. Start by putting one hand on your abdomen just above your waist. Inhale slowly through your nose. The hand on your abdomen should move out. Then exhale slowly through pursed lips. You should be able to feel the hand on your abdomen moving in as you exhale.  Learn and use controlled coughing to clear mucus from your lungs. Controlled coughing is a series of short, progressive coughs. The steps of controlled coughing are: 1. Lean your head slightly forward. 2. Breathe in deeply using diaphragmatic breathing. 3. Try to hold your breath for 3 seconds. 4. Keep your mouth slightly open while coughing twice. 5. Spit any mucus out into a tissue. 6. Rest and repeat the steps once or twice as needed.  Receive all protective vaccines your caregiver suggests, especially pneumococcal and influenza vaccines.  Learn to manage stress.  Schedule and attend all follow-up appointments as directed by your caregiver. It is important to keep all your appointments.  Participate in pulmonary rehabilitation as directed by your caregiver.  Use home oxygen as suggested.  SEEK MEDICAL CARE IF:   You are coughing up more mucus than usual.  There is a change in the color or thickness of the mucus.  Breathing is more labored than usual.  Your breathing is faster than usual.  Your skin color is more cyanotic than usual.  You are running out of the medicine you take for your breathing.  You are anxious, apprehensive, or restless.  You have a fever. SEEK IMMEDIATE MEDICAL CARE IF:   You have a rapid heart rate.  You have shortness of breath while you are resting.  You have shortness of breath that prevents you from being able to talk.  You have shortness of breath that prevents you from performing your usual physical activities.  You have chest pain  lasting longer than 5 minutes.  You have a seizure.  Your family or friends notice that you are agitated or confused. MAKE SURE YOU:   Understand these instructions.  Will watch your condition.  Will get help right away if you are not doing well or get worse. Document Released: 12/09/2004 Document Revised: 11/24/2011 Document Reviewed: 10/26/2012 [facility] Patient Information 2014 [place], [place].

## 2013-03-19 NOTE — Discharge Summary (Signed)
I saw [name] on day of discharge and assisted in the discharge planning.

## 2013-03-19 NOTE — Progress Notes (Signed)
Subjective: [name] feels as though her breathing is back at baseline and her leg swelling has improved since yesterday. I/O not properly recorded because patient is incontinent of urine and does not have foley in. Weight up 2lbs, though different scale used. Patient wants to go back to her NH today.  Objective: Vital signs in last 24 hours: Filed Vitals:   03/18/13 1741 03/18/13 2131 03/19/13 0500 03/19/13 0958  BP: 129/56 105/52 117/55 112/54  Pulse: 86 91 79   Temp:  97.5 F (36.4 C) 97.1 F (36.2 C) 97.6 F (36.4 C)  TempSrc:  Oral Oral Oral  Resp:  18 18   Height:      Weight:   71.9 kg (158 lb 8.2 oz)   SpO2:  100% 94%    Weight change: 1.2 kg (2 lb 10.3 oz) Weight up 2lbs since yesterday, though nurse noted different scale was used.   Intake/Output Summary (Last 24 hours) at 03/19/13 1114 Last data filed at 03/19/13 1010  Gross per 24 hour  Intake    900 ml  Output    400 ml  Net    500 ml   Physical Exam General: sitting up in chair, very pleasant, answering questions appropriately  HEENT: NCAT, PERRL,  Hard of hearing Cardiac: RRR Pulm: lungs CTAB, normal WOB Abd: soft, nontender, nondistended, BS present Ext: warm extremities bilaterally, 1-2+ pitting edema b/l lower extremities up to mid thigh Neuro: alert and oriented X3, cranial nerves II-XII grossly intact, moving all extremities spontaneously   Lab Results: Basic Metabolic Panel:  Recent Labs Lab 03/18/13 0440 03/19/13 0649  NA 137 140  K 4.3 4.0  CL 98 99  CO2 26 28  GLUCOSE 175* 171*  BUN 47* 58*  CREATININE 1.46* 1.65*  CALCIUM 8.6 8.3*  MG 1.8  --    Liver Function Tests:  Recent Labs Lab 03/17/13 0420  AST 24  ALT 14  ALKPHOS 72  BILITOT 1.0  PROT 5.5*  ALBUMIN 2.6*   CBC:  Recent Labs Lab 03/16/13 1100 03/17/13 0420  WBC 4.4 2.0*  NEUTROABS 3.5  --   HGB 9.3* 8.1*  HCT 28.2* 25.3*  MCV 94.3 95.5  PLT 74* 64*   Cardiac Enzymes:  Recent Labs Lab 03/16/13 1915  03/16/13 2320  TROPONINI <0.30 <0.30   BNP:  Recent Labs Lab 03/16/13 1100  PROBNP 6942.0*   CBG:  Recent Labs Lab 03/16/13 1618 03/17/13 0713 03/18/13 0639 03/19/13 0545  GLUCAP 112* 208* 208* 170*   Urinalysis:  Recent Labs Lab 03/16/13 1204  COLORURINE YELLOW  LABSPEC 1.009  PHURINE 5.5  GLUCOSEU NEGATIVE  HGBUR NEGATIVE  BILIRUBINUR NEGATIVE  KETONESUR NEGATIVE  PROTEINUR NEGATIVE  UROBILINOGEN 1.0  NITRITE POSITIVE*  LEUKOCYTESUR MODERATE*   Micro Results: Recent Results (from the past 240 hour(s))  URINE CULTURE     Status: None   Collection Time    03/16/13 12:04 PM      Result Value Range Status   Specimen Description URINE, RANDOM   Final   Special Requests NONE   Final   Culture  Setup Time     Final   Value: 03/16/2013 13:53     Performed at [facility]   Colony Count     Final   Value: >=100,000 COLONIES/ML     Performed at [facility]   Culture     Final   Value: KLEBSIELLA PNEUMONIAE     Performed at [facility]  Report Status 03/18/2013 FINAL   Final   Organism ID, Bacteria KLEBSIELLA PNEUMONIAE   Final  MRSA PCR SCREENING     Status: Abnormal   Collection Time    03/16/13  8:20 PM      Result Value Range Status   MRSA by PCR POSITIVE (*) NEGATIVE Final   Comment:            The GeneXpert MRSA Assay (FDA     approved for NASAL specimens     only), is one component of a     comprehensive MRSA colonization     surveillance program. It is not     intended to diagnose MRSA     infection nor to guide or     monitor treatment for     MRSA infections.     RESULT CALLED TO, READ BACK BY AND VERIFIED WITH:     R.COLUMBRES RN AT 0015 BY L.PITT 03/17/13   Studies/Results: No results found. Medications: I have reviewed the patient's current medications. Scheduled Meds: . carvedilol  6.25 mg Oral BID WC  . Chlorhexidine Gluconate Cloth  6 each Topical Q0600  . cholecalciferol  1,000 Units Oral Daily  .  ciprofloxacin  250 mg Oral BID  . citalopram  20 mg Oral Daily  . donepezil  10 mg Oral QHS  . heparin subcutaneous  5,000 Units Subcutaneous Q8H  . ipratropium-albuterol  3 mL Nebulization QID  . isosorbide mononitrate  30 mg Oral Daily  . lisinopril  2.5 mg Oral Daily  . morphine  15 mg Oral Q12H  . mupirocin ointment  1 application Nasal BID  . pantoprazole  40 mg Oral Daily  . predniSONE  60 mg Oral Q breakfast  . sodium chloride  3 mL Intravenous Q12H  . spironolactone  25 mg Oral Daily   Continuous Infusions:  PRN Meds:.acetaminophen, ipratropium-albuterol  Assessment/Plan: Ms. Dols is a 80 year old female with combined CHF, CKD stage 3, and HTN admitted for CHF exacerbation.   Acute Exacerbation of Combined CHF (EF 20-25%)- Breathing back at baseline today with much improved BLE edema today after receiving a second dose of lasix 80mg  IV. Patient feels ready to go home. Her Cr increased to 1.65 from 1.46 today, likely 2/2 lasix effect. Will plan to discharge back to [facility] on lasix 40mg  PO BID with close PCP f/u. -daily weights -strict I/Os -d/c on lasix 40mg  BID tomorrow -continue spironolactone 25mg  daily -continue lisinopril 2.5mg  daily (BP seems to be tolerating)  COPD--Wheezing has resolved. Apparently on continuous home O2 (2L) prn in NH for ?worsening dyspnea. Her O2 saturation was stable overnight even off of oxygen. -continue duonebs -Continue PO prednisone 60 mg x 3 more days  -Consider ADVAIR as outpatient for maintenance therapy   Paroxysmal Atrial Fibrillation--remains in sinus rhythm. Pt on carvedilol for rate control, not on AC therapy due to chronic anemia.  -Continue carvedilol 6.25 mg BID   Uncomplicated UTI-Resolved. UCx grew klebsiella, sensitive to cipro. Pt is s/p rocephin 1g in ED and a 3 day course of cipro (finished yesterday).   Chronic Normocytic Anemia - Stable. Per documentation, she requires frequent blood and iron transfusions. Etiology  unknown or multifactorial with lower GI blood loss (internal hemorrhoids) vs. liver cirrhosis vs. Myelodysplastic syndrome (has refused biopsy in the past).   Chronic Thrombocytopenia--Etiology thought to be due to liver cirrhosis. Pt with baseline platelet count of 70-80K. Down to 64 from 62 on admission. Did not recheck CBC  this AM.   Chronic Kidney Disease Stage 3--stable.  Cr of 1.44 on admission with baseline Cr 1.2-3. GFR 33.  -Continue to monitor Cr   Chronic back pain--demands her pain medications on time.  Hx of somnolence with excessive medications but tolerates BID MS contin well and occasionally Norco 5-325mg  as extra dose if needed at NH.  -Continue MS contin 15 mg BID   Mood disorder--stable, hx of major depressive disorder, anxiety, and mild cognitive impairment.  -Continue citalopram and donepezil  Diet: Regular   DVT Ppx: SQ heparin TID   Code: DNR/DNI  Dispo: Discharge to NH today.  The patient does have a current PCP Jethro Bastos, MD) and does not need an [facility] hospital follow-up appointment after discharge.  The patient does not have transportation limitations that hinder transportation to clinic appointments.  .Services Needed at time of discharge: Y = Yes, Blank = No PT:   OT:   RN:   Equipment:   Other:     LOS: 3 days   Windell Hummingbird, MD 03/19/2013, 11:14 AM

## 2013-03-19 NOTE — Progress Notes (Signed)
Pt discharged per w/c accompanied by staff from [facility] Pt d/c'd in her own wheelchair has her teeth. Son aware of calls made about Lt ear hearing aid. Son has Risk management phone #. Info packet given to attendant named Tom.

## 2013-03-19 NOTE — Progress Notes (Signed)
At intervals pt says she needs her nose thing ( her way of saying she needs her 0xygen) on RA sat is 97 % placed on 1 l n/c sats than 98 to 99 %. Says she wears it when she needs it at her  Facility. Pt dressed and ready for D/C back [facility].

## 2013-03-19 NOTE — Discharge Summary (Addendum)
Name: [name] MRN: [id number] DOB: [birth year] 80 y.o. PCP: Jethro Bastos, MD  Date of Admission: 03/16/2013 10:39 AM Date of Discharge: 03/19/2013 Attending Physician: Dr. Criselda Peaches Discharge Diagnosis:  Principal Problem:   Systolic CHF, acute on chronic (worsening LV function) EF of 20-25% on echo (12/16) Active Problems:   HYPERTENSION   Normocytic anemia   UTI (lower urinary tract infection) due to Klebsiella   CKD (chronic kidney disease) stage 3, GFR 30-59 ml/min  Discharge Medications:   Medication List         acetaminophen 325 MG tablet  Commonly known as:  TYLENOL  Take 650 mg by mouth every 6 (six) hours as needed for mild pain, fever or headache.     albuterol 108 (90 BASE) MCG/ACT inhaler  Commonly known as:  PROVENTIL HFA;VENTOLIN HFA  Inhale 2 puffs into the lungs every 6 (six) hours as needed for shortness of breath.     carvedilol 6.25 MG tablet  Commonly known as:  COREG  Take 1 tablet (6.25 mg total) by mouth 2 (two) times daily with a meal.     cholecalciferol 1000 UNITS tablet  Commonly known as:  VITAMIN D  Take 1,000 Units by mouth daily.     citalopram 20 MG tablet  Commonly known as:  CELEXA  Take 20 mg by mouth daily.     donepezil 10 MG tablet  Commonly known as:  ARICEPT  Take 10 mg by mouth at bedtime.     furosemide 40 MG tablet  Commonly known as:  LASIX  Take 1 tablet (40 mg total) by mouth 2 (two) times daily. Starting this evening (1/5)     GLUCERNA Liqd  Take 1 Can by mouth daily.     hydrocortisone 2.5 % rectal cream  Commonly known as:  ANUSOL-HC  Place 1 application rectally 3 (three) times daily.     ipratropium-albuterol 0.5-2.5 (3) MG/3ML Soln  Commonly known as:  DUONEB  Take 3 mLs by nebulization 4 (four) times daily.     isosorbide mononitrate 30 MG 24 hr tablet  Commonly known as:  IMDUR  Take 30 mg by mouth daily.     lisinopril 2.5 MG tablet  Commonly known as:  PRINIVIL,ZESTRIL  Take 1 tablet  (2.5 mg total) by mouth daily.     LORazepam 0.5 MG tablet  Commonly known as:  ATIVAN  Take 0.5 mg by mouth at bedtime.     morphine 15 MG 12 hr tablet  Commonly known as:  MS CONTIN  Take 15 mg by mouth every 12 (twelve) hours.     omeprazole 20 MG capsule  Commonly known as:  PRILOSEC  Take 20 mg by mouth daily.     predniSONE 20 MG tablet  Commonly known as:  DELTASONE  Take 3 tablets (60 mg total) by mouth daily with breakfast.  Start taking on:  03/20/2013     spironolactone 25 MG tablet  Commonly known as:  ALDACTONE  Take 1 tablet (25 mg total) by mouth daily.       Disposition and follow-up:   [name] was discharged from [facility] in stable condition.  At the hospital follow up visit please address:  CHF Exacerbation: remains fluid overloaded on exam in lower extremities but improved respiratory status and responding to diuresis. Discharged on Lasix 40mg  po bid and continuation of home Spironolactone 25mg  po qd. Please reassess fluid status and adjust diuretics as needed. Weight on  discharge 158lb, but could be inaccurate due to scale difference. Baseline ?145lb's.   COPD: improved with prednisone. Seems to have chronic wheeze at baseline but resolved this admission.  Will continue for total 5 days. May consider long acting medication, maybe Advair if she has frequent exacerbations.   CKD 3--Cr slightly raised at discharge with IV diuresis. Cr 1.65 on discharge. Repeat BMET and adjust lasix dose. Started on low dose ACEi this admission.   Anemia--Hb remained stable during admission. Hb 8.1 on 03/17/13. Follow up cbc on discharge. Gets frequent blood and iron transfusions.   HTN--started low dose Lisinopril 2.5mg  qd and continued home coreg, IMDUR, Lisinopril, and Spironolactone.  Adjusted lasix dose.    2.  Labs / imaging needed at time of follow-up: CBC and BMET.   3.  Pending labs/ test needing follow-up: Reassess volume status.    Follow-up Appointments:     Follow-up Information   Follow up with Thane EduKOEHLER,ROBERT NICHOLAS, MD. Schedule an appointment as soon as possible for a visit in 2 weeks.   Specialty:  Family Medicine   Contact information:   [address] [place] [address] [phone]      Discharge Instructions: Discharge Orders   Future Appointments Provider Department Dept Phone   03/22/2013 2:00 PM [facility] [phone]   03/23/2013 8:00 AM [facility] 7 [facility] [phone]   Future Orders Complete By Expires   Call MD for:  difficulty breathing, headache or visual disturbances  As directed    Call MD for:  persistant nausea and vomiting  As directed    Call MD for:  severe uncontrolled pain  As directed    Diet - low sodium heart healthy  As directed    Increase activity slowly  As directed      Procedures Performed:  Dg Chest Port 1 View  03/16/2013   CLINICAL DATA:  Short of breath, tachycardic  EXAM: PORTABLE CHEST - 1 VIEW  COMPARISON:  Prior chest x-ray 01/06/2013  FINDINGS: Stable cardiac and mediastinal contours. Coarse calcifications noted in the transverse aorta. There is a pulmonary vascular congestion with diffuse mild interstitial prominence consistent with mild interstitial edema. Prominence of the right infrahilar region is similar compared to prior radiographs. There is mild enlargement of the main and central pulmonary arteries. No acute osseous abnormality. Surgical changes of prior partial right clavicular resection. No large pleural effusion. No pneumothorax.  IMPRESSION: Pulmonary vascular congestion with mild interstitial edema concerning for CHF.   Electronically Signed   By: Malachy MoanHeath  McCullough M.D.   On: 03/16/2013 11:26   Admission HPI: [name] is a 80 year old female with past medical history of combined CHF (last 2D-echo 20-25% on 02/28/12) due to ischemic  cardiomyopathy, hypertension, paroxysmal atrial fibrillation, COPD on home oxygen, non-insulin Type II DM, Stage III CKD, chronic thrombocytopenia, and chronic anemia requiring blood and iron transfusions who presents with shortness of breath, wheezing, and leg swelling.  Pt was seen today in short stay and to receive blood transfusion when she became short of breath with wheezing.  She reports being short of breath for the past month but worse in last 2 days with worsening wheezing from baseline. Per family she has had dry cough for the past 2 days. She has history of COPD and recently placed on home oxygen (2L ) since being placed in nursing home where she also receives breathing treatment frequently (x4 daily).  Her last exacerbation was 10/25 and was seen in the ED. She is a former heavy smoker.  She has history of combined CHF with last 2D-echo on 02/28/12 with EF 20-25%. Her last exacerbation was last week with weight increase to 155 where she was given IM furosemide. Her baseline weight is 145. She reports being compliant with lasix 40 mg BID and with low-sodium diet. Per family for the last 2 days she has weeping from her swollen legs.  She denies fever, chills, rhinorrhea, sore throat, body aches, chest pain, nausea, vomiting, abdominal pain, lightheadness, headache, or change in BM or urination.   Hospital Course by problem list: Principal Problem:   Systolic CHF, acute on chronic (worsening LV function) EF of 20-25% on echo (12/16) Active Problems:   HYPERTENSION   Normocytic anemia   UTI (lower urinary tract infection) due to Klebsiella   CKD (chronic kidney disease) stage 3, GFR 30-59 ml/min  Acute on chronic CHF--presented with SOB and wheezing with anasarca up to abdomen on admission. Baseline weight seems to be ~145lb's but approximately 10 pounds 1 week prior to admission per pcp and remained ~10 pounds over during admission but did diurese well with IV diuretics and transition to  PO. Weight on discharge 158lb's but may be inaccurate.  Clinically, lower extremity edema mildly improved and respiratory status improved with no wheezing or dyspnea.  No desaturation noted on ambulation, with o2 sat 98% while ambulating on room air, but patient often asks for oxygen during admission and at [facility]. Will discharge with lasix 40mg  po bid along with spironolactone 25mg  daily. Volume status will need to be assessed daily and adjustment of diuresis. Also repeat BMET to monitor Cr given increase in lasix dose. Follow up with PCP. She was continued on home coreg 6.25mg  bid, IMDUR 30mg  qd, and started lisinopril 2.5mg  qd.   HTN--stable during admission with lasix, spironolactone, coreg, and IMDUR. We also started low dose ACE, lisinopril 2.5mg  this admission.   CKD stage 3--stable during admission, slight increase at discharge due to aggressive diuresis with IV lasix. Cr on discharge 1.65. Will decrease lasix back down to 40mg  po bid and continuation of spironolactone. Please repeat BMET on follow up and adjust medications as needed, keeping in mind that ACEi was started this admission.   Klebsiella Pneumoniae UTI--sensitive to ciprofloxacin. Treated initially with Rocephin IV x1 dose in ED and transitioned to PO Ciprofloxacin for total 3 day course. Symptoms have resolved, no dysuria.   Normocytic anemia--apparently requires frequent blood and iron transfusions. Possibly secondary to chronic hemorrhoidal bleeding vs. Cirrhosis vs. Myelodysplastic syndrome.  Was not transfused this admission. Hb stable with no evidence of obvious bleeding. Hb 8.1 on 03/17/13. Please closely monitor cbc and transfuse as needed per pcp and schedule. Appears to have some appointments in Epic on 1/8 and 03/23/13 for transfusions. Follow up with PCP.   Discharge Vitals:   BP 112/54  Pulse 79  Temp(Src) 97.6 F (36.4 C) (Oral)  Resp 18  Ht 5\' 2"  (1.575 m)  Wt 158 lb 8.2 oz (71.9 kg)  BMI 28.98 kg/m2  SpO2  94%  Discharge Labs:  Results for orders placed during the hospital encounter of 03/16/13 (from the past 24 hour(s))  GLUCOSE, CAPILLARY     Status: Abnormal   Collection Time    03/19/13  5:45 AM      Result Value Range   Glucose-Capillary 170 (*) 70 - 99 mg/dL  BASIC METABOLIC PANEL     Status:  Abnormal   Collection Time    03/19/13  6:49 AM      Result Value Range   Sodium 140  137 - 147 mEq/L   Potassium 4.0  3.7 - 5.3 mEq/L   Chloride 99  96 - 112 mEq/L   CO2 28  19 - 32 mEq/L   Glucose, Bld 171 (*) 70 - 99 mg/dL   BUN 58 (*) 6 - 23 mg/dL   Creatinine, Ser 4.09 (*) 0.50 - 1.10 mg/dL   Calcium 8.3 (*) 8.4 - 10.5 mg/dL   GFR calc non Af Amer 28 (*) >90 mL/min   GFR calc Af Amer 33 (*) >90 mL/min   Signed: Darden Palmer, MD 03/19/2013, 12:26 PM   Time Spent on Discharge: 40 minutes Services Ordered on Discharge: none Equipment Ordered on Discharge: none

## 2013-03-19 NOTE — Progress Notes (Signed)
[facility] Nurse aware That pt did not want to wear her dentures. Upper and lower Dentures are in a cup with lid in pt's red bag. Pt left in personal w/c but it has no legs on it. Pt also had own 02 tank. Pt wanted to wear 02 while discharging & placed on 1 L n/c prior to d/c.

## 2013-03-20 ENCOUNTER — Other Ambulatory Visit: Payer: Self-pay | Admitting: *Deleted

## 2013-03-20 MED ORDER — LORAZEPAM 0.5 MG PO TABS: 0.5000 mg | ORAL_TABLET | Freq: Every day | ORAL | Status: DC

## 2013-03-20 MED ORDER — MORPHINE SULFATE ER 15 MG PO TBCR: 15.0000 mg | EXTENDED_RELEASE_TABLET | Freq: Two times a day (BID) | ORAL | Status: DC

## 2013-03-22 ENCOUNTER — Encounter (HOSPITAL_COMMUNITY)
Admission: RE | Admit: 2013-03-22 | Discharge: 2013-03-22 | Disposition: A | Discharge status: Home / Self Care | Payer: Medicare (Managed Care) | Source: Ambulatory Visit | Attending: Family Medicine | Admitting: Family Medicine

## 2013-03-22 DIAGNOSIS — K922 Gastrointestinal hemorrhage, unspecified: Secondary | ICD-10-CM | POA: Diagnosis not present

## 2013-03-22 DIAGNOSIS — D649 Anemia, unspecified: Secondary | ICD-10-CM | POA: Diagnosis present

## 2013-03-22 DIAGNOSIS — D500 Iron deficiency anemia secondary to blood loss (chronic): Secondary | ICD-10-CM | POA: Diagnosis not present

## 2013-03-22 LAB — PREPARE RBC (CROSSMATCH)

## 2013-03-23 ENCOUNTER — Encounter (HOSPITAL_COMMUNITY)
Admission: RE | Admit: 2013-03-23 | Discharge: 2013-03-23 | Disposition: A | Discharge status: Home / Self Care | Payer: Medicare (Managed Care) | Source: Ambulatory Visit | Attending: Family Medicine | Admitting: Family Medicine

## 2013-03-23 DIAGNOSIS — K922 Gastrointestinal hemorrhage, unspecified: Secondary | ICD-10-CM | POA: Diagnosis not present

## 2013-03-23 LAB — PREPARE RBC (CROSSMATCH)

## 2013-03-23 MED ORDER — FUROSEMIDE 40 MG PO TABS
40.0000 mg | ORAL_TABLET | Freq: Once | ORAL | Status: AC
  Administered 2013-03-23: 40 mg via ORAL
  Filled 2013-03-23 (×2): qty 1

## 2013-03-23 MED ORDER — FUROSEMIDE 10 MG/ML IJ SOLN
40.0000 mg | Freq: Once | INTRAMUSCULAR | Status: DC
  Filled 2013-03-23 (×2): qty 4

## 2013-03-23 MED ORDER — CARVEDILOL 3.125 MG PO TABS
6.2500 mg | ORAL_TABLET | Freq: Two times a day (BID) | ORAL | Status: AC
  Administered 2013-03-23: 11:00:00 6.25 mg via ORAL
  Filled 2013-03-23: qty 2

## 2013-03-23 MED ORDER — CARVEDILOL 3.125 MG PO TABS: 6.2500 mg | ORAL_TABLET | Freq: Two times a day (BID) | ORAL | Status: DC

## 2013-03-23 NOTE — Progress Notes (Addendum)
Called Dr. Malon KindleKoehler's office and spoke with Chales AbrahamsMary Ann, NP about patient's condition.  Patient came to Short Stay for [facility] without receiving morning medications including lasix and coreg.  Patient's condition changed after arrival with worsening wheezing that was audible and auscultatory.  We were able to get orders to give patient morning medications.  Patient is not acute enough for emergency room visit, but it would not be safe to give patient blood transfusion in the outpatient setting.  Chales AbrahamsMary Ann, NP instructed for us to call transportation to come and get patient.  She would see patient over the weekend to assess patient.  We would attempt to transfuse again next week at next appointment.

## 2013-03-23 NOTE — Discharge Summary (Signed)
I saw [name] on day of discharge and assisted with the discharge planning.

## 2013-03-26 LAB — TYPE AND SCREEN
ABO/RH(D): A POS
Antibody Screen: NEGATIVE
Unit division: 0

## 2013-03-27 ENCOUNTER — Other Ambulatory Visit: Payer: Self-pay | Admitting: *Deleted

## 2013-03-27 MED ORDER — MORPHINE SULFATE ER 15 MG PO TBCR: 15.0000 mg | EXTENDED_RELEASE_TABLET | Freq: Two times a day (BID) | ORAL | Status: AC

## 2013-03-27 MED ORDER — LORAZEPAM 0.5 MG PO TABS: 0.5000 mg | ORAL_TABLET | Freq: Every day | ORAL | Status: AC

## 2013-03-27 NOTE — Telephone Encounter (Signed)
rx filled per protocol  

## 2013-03-28 ENCOUNTER — Other Ambulatory Visit (HOSPITAL_COMMUNITY): Payer: Self-pay | Admitting: *Deleted

## 2013-03-29 ENCOUNTER — Encounter (HOSPITAL_COMMUNITY)
Admission: RE | Admit: 2013-03-29 | Discharge: 2013-03-29 | Disposition: A | Discharge status: Home / Self Care | Payer: Medicare (Managed Care) | Source: Ambulatory Visit | Attending: Family Medicine | Admitting: Family Medicine

## 2013-03-29 DIAGNOSIS — K922 Gastrointestinal hemorrhage, unspecified: Secondary | ICD-10-CM | POA: Diagnosis not present

## 2013-03-29 LAB — PREPARE RBC (CROSSMATCH)

## 2013-03-30 ENCOUNTER — Encounter (HOSPITAL_COMMUNITY)
Admission: RE | Admit: 2013-03-30 | Discharge: 2013-03-30 | Disposition: A | Discharge status: Home / Self Care | Payer: Medicare (Managed Care) | Source: Ambulatory Visit | Attending: Family Medicine | Admitting: Family Medicine

## 2013-03-30 DIAGNOSIS — K922 Gastrointestinal hemorrhage, unspecified: Secondary | ICD-10-CM | POA: Diagnosis not present

## 2013-03-30 MED ORDER — FUROSEMIDE 10 MG/ML IJ SOLN
40.0000 mg | Freq: Once | INTRAMUSCULAR | Status: AC
  Administered 2013-03-30: 14:00:00 40 mg via INTRAVENOUS
  Filled 2013-03-30 (×3): qty 4

## 2013-03-31 LAB — TYPE AND SCREEN
ABO/RH(D): A POS
Antibody Screen: NEGATIVE
Unit division: 0

## 2013-04-15 DEATH — deceased

## 2013-06-16 NOTE — Progress Notes (Signed)
.   Patient ID: [name], female   DOB: [birth year], 80 y.o.   MRN: [id number]    Chief Complaint  Patient presents with  . Medical Managment of Chronic Issues    Routine Visit    HPI: Pt is being followed for the medical management. Pt and health care team denies issues.      Allergies  Allergen Reactions  . Darvon [Propoxyphene Hcl] Nausea Only  . Heparin Other (See Comments)    unknown  . Paroxetine Other (See Comments)    "makes me crazy" out of body experience     Medication List       This list is accurate as of: 02/14/13 11:59 PM.  Always use your most recent med list.               albuterol 108 (90 BASE) MCG/ACT inhaler  Commonly known as:  PROAIR HFA  Inhale 2 puffs into the lungs every 4 (four) hours as needed.     albuterol (2.5 MG/3ML) 0.083% nebulizer solution  Commonly known as:  PROVENTIL  Take 2.5 mg by nebulization every 6 (six) hours as needed for wheezing or shortness of breath.     carvedilol 6.25 MG tablet  Commonly known as:  COREG  Take 1 tablet (6.25 mg total) by mouth 2 (two) times daily with a meal.     cholecalciferol 1000 UNITS tablet  Commonly known as:  VITAMIN D  Take 1,000 Units by mouth daily.     citalopram 20 MG tablet  Commonly known as:  CELEXA  Take 20 mg by mouth daily.     donepezil 10 MG tablet  Commonly known as:  ARICEPT  Take 10 mg by mouth at bedtime as needed.     furosemide 40 MG tablet  Commonly known as:  LASIX  Take 1 tablet (40 mg total) by mouth 2 (two) times daily.     HYDROcodone-acetaminophen 5-325 MG per tablet  Commonly known as:  NORCO/VICODIN  1 by mouth four times daily as needed DO NOT EXCEED 3GM APAP/24HR     ipratropium-albuterol 0.5-2.5 (3) MG/3ML Soln  Commonly known as:  DUONEB  Take 3 mLs by nebulization once.     isosorbide mononitrate 30 MG 24 hr tablet  Commonly known as:  IMDUR  Take 30 mg by mouth daily.     methylPREDNISolone sodium succinate 125 mg/2 mL injection    Commonly known as:  SOLU-MEDROL  Inject 125 mg into the vein once.     morphine 15 MG 12 hr tablet  Commonly known as:  MS CONTIN  1 by mouth every 12 hours DO NOT CRUSH     omeprazole 20 MG capsule  Commonly known as:  PRILOSEC  Take 20 mg by mouth daily.     predniSONE 20 MG tablet  Commonly known as:  DELTASONE  Take 3 tablets (60 mg total) by mouth daily.     PSYLLIUM PO  Take 3 capsules by mouth daily.     spironolactone 25 MG tablet  Commonly known as:  ALDACTONE  Take 1 tablet (25 mg total) by mouth daily.         DATA REVIEWED   Laboratory Studies: Reviewed      Past Medical History  Diagnosis Date  . Alzheimer's disease   . 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 Chronic kidney disease, stage III (moderate)   . Unspecified vitamin D deficiency   . Arthritis  osteo  . Hypertension   . Hyperlipidemia   . Depression   . GERD (gastroesophageal reflux disease)   . Cataract   . Exudative senile macular degeneration of retina   . Retinal neovascularization NOS   . Chronic combined systolic and diastolic heart failure   . Non-ischemic cardiomyopathy     EF 20-25% 2D 12/13  . PAF (paroxysmal atrial fibrillation)   . Pure hypercholesterolemia   . Esophageal reflux   . COPD (chronic obstructive pulmonary disease)   . Primary localized osteoarthrosis, lower leg   . Lumbago   . Vitamin D deficiency   . Macular degeneration   . Chronic pain     back  . Aortic stenosis, mild     echo 12/13  . Normal coronary arteries 10/09, 6/12  . LBBB (left bundle branch block)   . Microcytic anemia, endoscopy negative for PUD 03/01/12 02/28/2012  . Shortness of breath   . Diabetes mellitus without complication   . Neuromuscular disorder     DIABETIC NEUROPATHY     Past Surgical History  Procedure Laterality Date  . Knee surgery  1997  . Appendectomy  1949  . Tonsillectomy    . Esophagogastroduodenoscopy  03/01/2012    Procedure:  ESOPHAGOGASTRODUODENOSCOPY (EGD);  Surgeon: Graylin ShiverSalem F Ganem, MD;  [facility];  Service: Endoscopy;  Laterality: N/A;  . Abdominal hysterectomy  1970      Review of Systems  Respiratory: Negative.   Cardiovascular: Negative.   Neurological: Negative.      Physical Exam Filed Vitals:   02/14/13 1653  BP: 125/66  Pulse: 66  Temp: 97.8 F (36.6 C)  Resp: 20   Physical Exam  Constitutional: She is oriented to person, place, and time. She appears well-developed.  Cardiovascular: Normal rate and regular rhythm.   Pulmonary/Chest: Effort normal and breath sounds normal.  Neurological: She is alert and oriented to person, place, and time.    ASSESSMENT/PLAN    Follow up:     This encounter was created in error - please disregard.

## 2013-11-09 IMAGING — CR DG CHEST 2V
1 series · 1 of 1 positions shown · non-contrast
Comparison: 09/08/2012.

CLINICAL DATA: Respiratory distress. Chest pain.

EXAM:
CHEST  2 VIEW

[w chest pa]
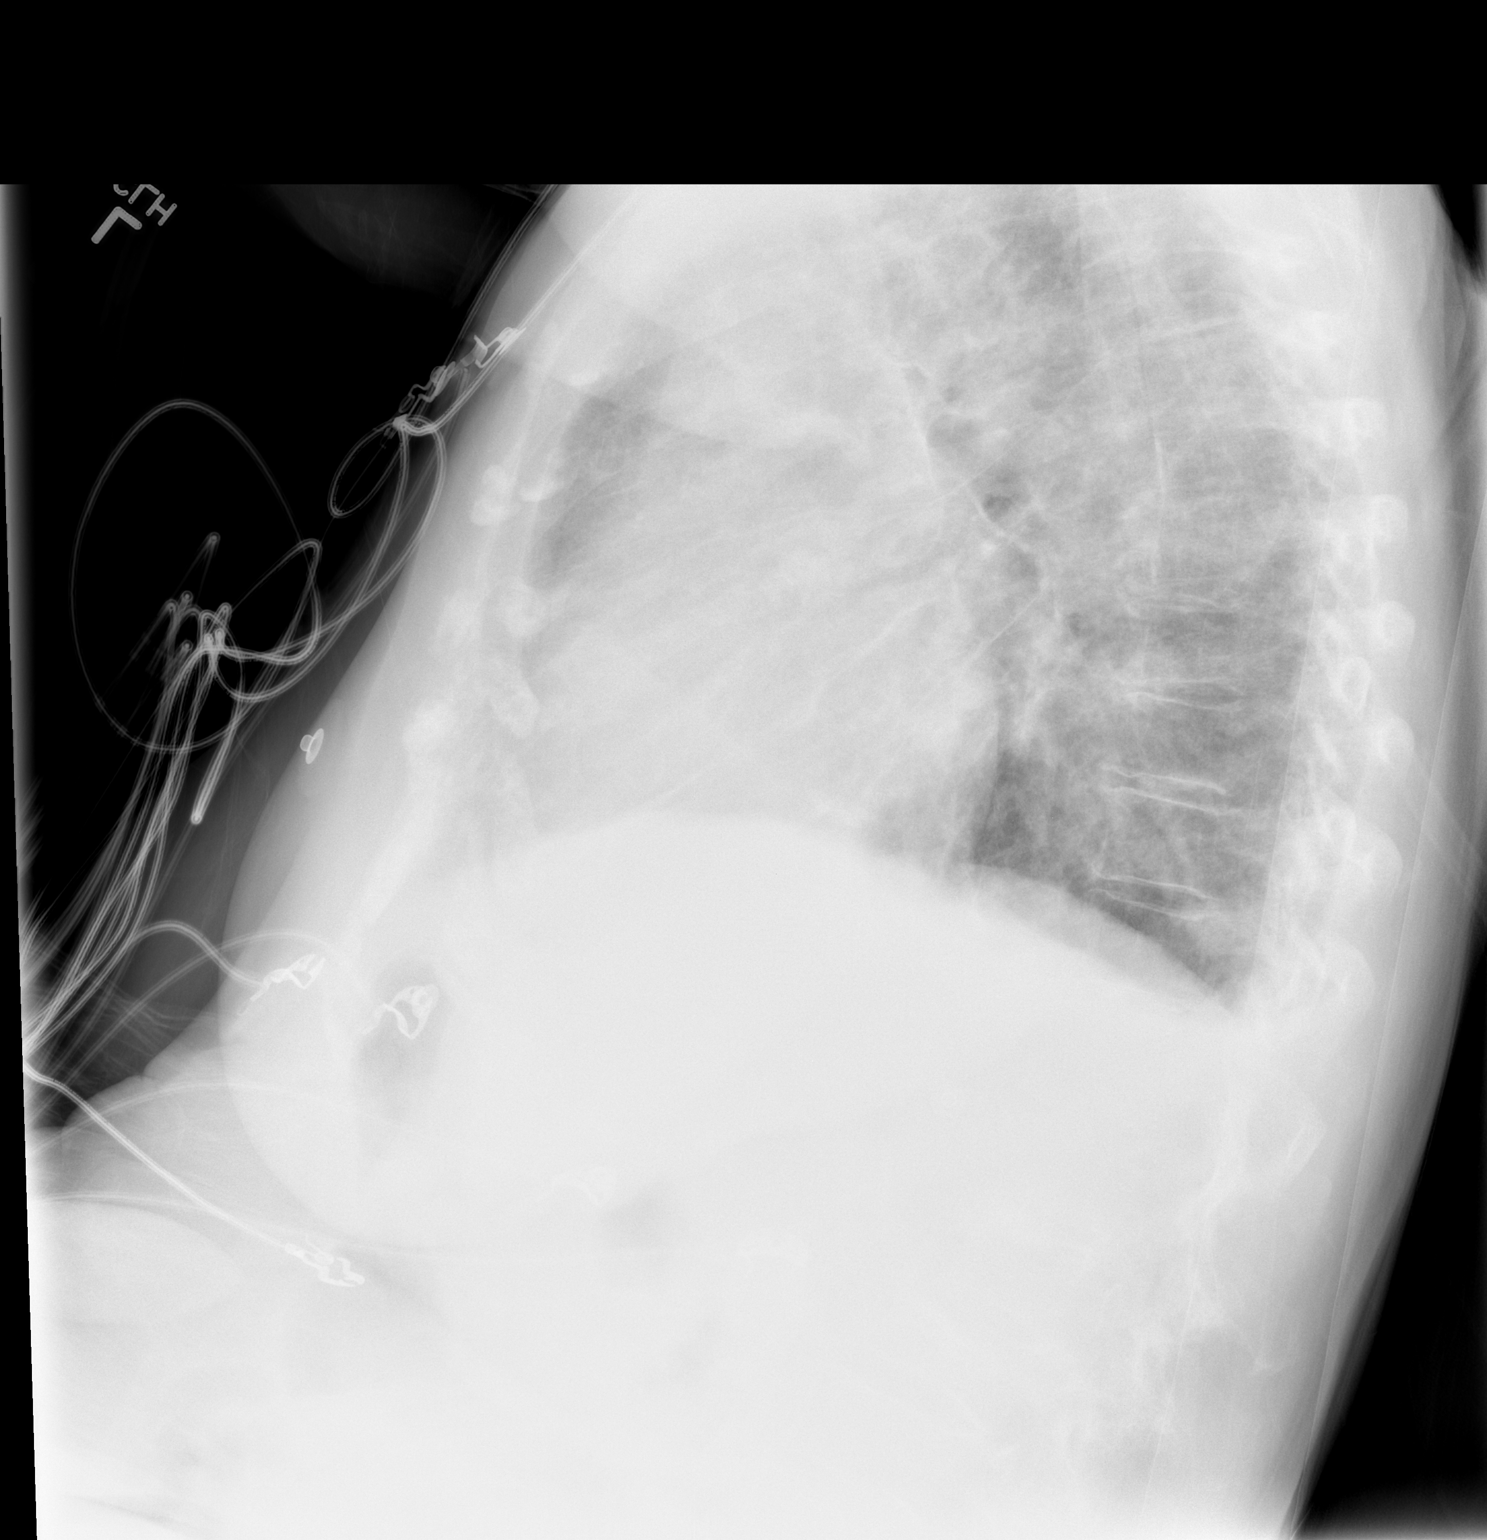

[1 of 1 positions shown; findings below may reference images not displayed]

FINDINGS: Cardiopericardial silhouette upper limits of normal for projection.
There is pulmonary vascular congestion and mild alveolar pulmonary
edema most compatible with volume overload/ CHF. Probable small
bilateral pleural or fusions on the lateral view. Aortic arch
atherosclerosis. Calcification of the tracheobronchial tree.
Monitoring leads project over the upper abdomen.
IMPRESSION: Constellation of findings compatible with mild to moderate CHF.

## 2013-11-25 IMAGING — CR DG THORACIC SPINE 3V
3 series · 3 of 3 positions shown · non-contrast
Comparison: Chest radiographs 01/06/2013 and earlier.

CLINICAL DATA: 83-year- female with falls and back pain. Initial
encounter.

EXAM:
THORACIC SPINE - 2 VIEW + SWIMMERS

[t t-spine a.p.]
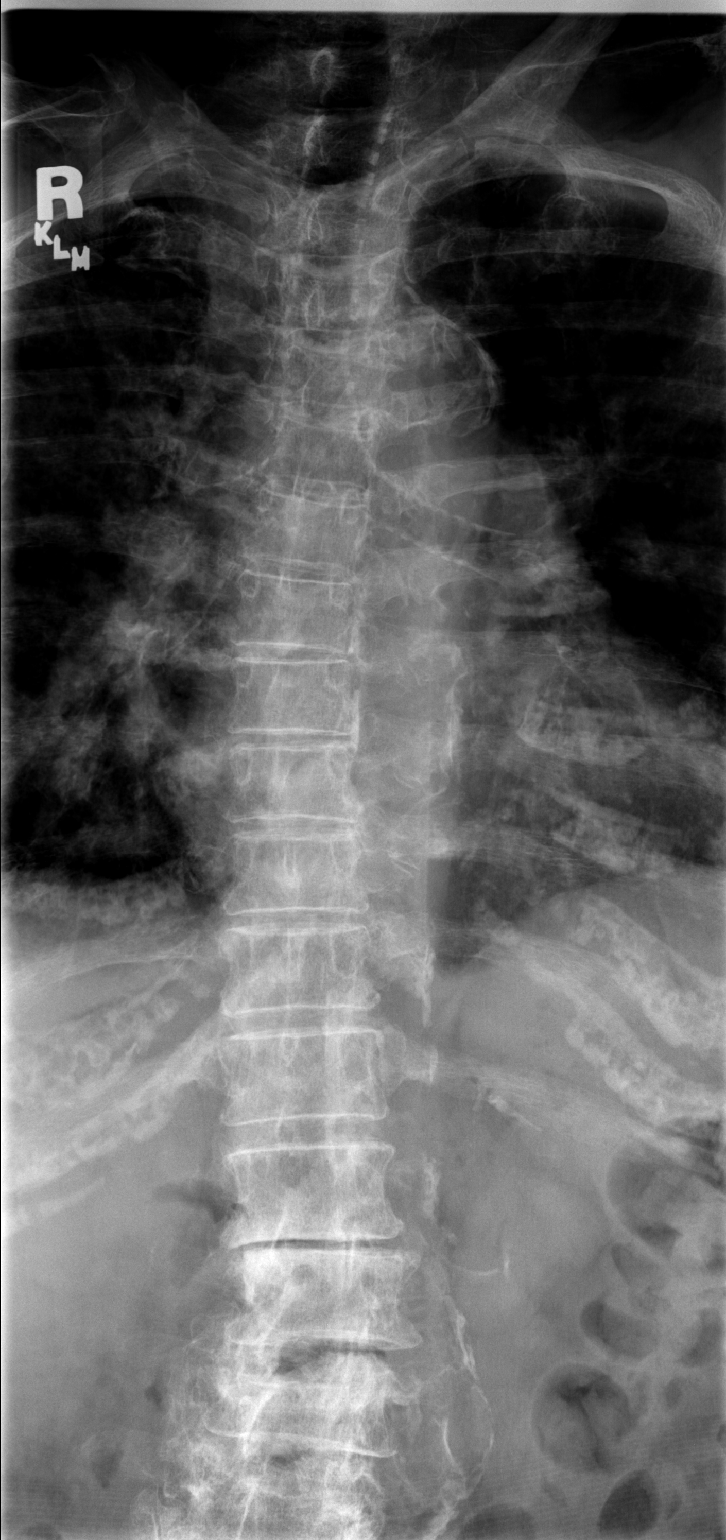

[t t-spine lat *]
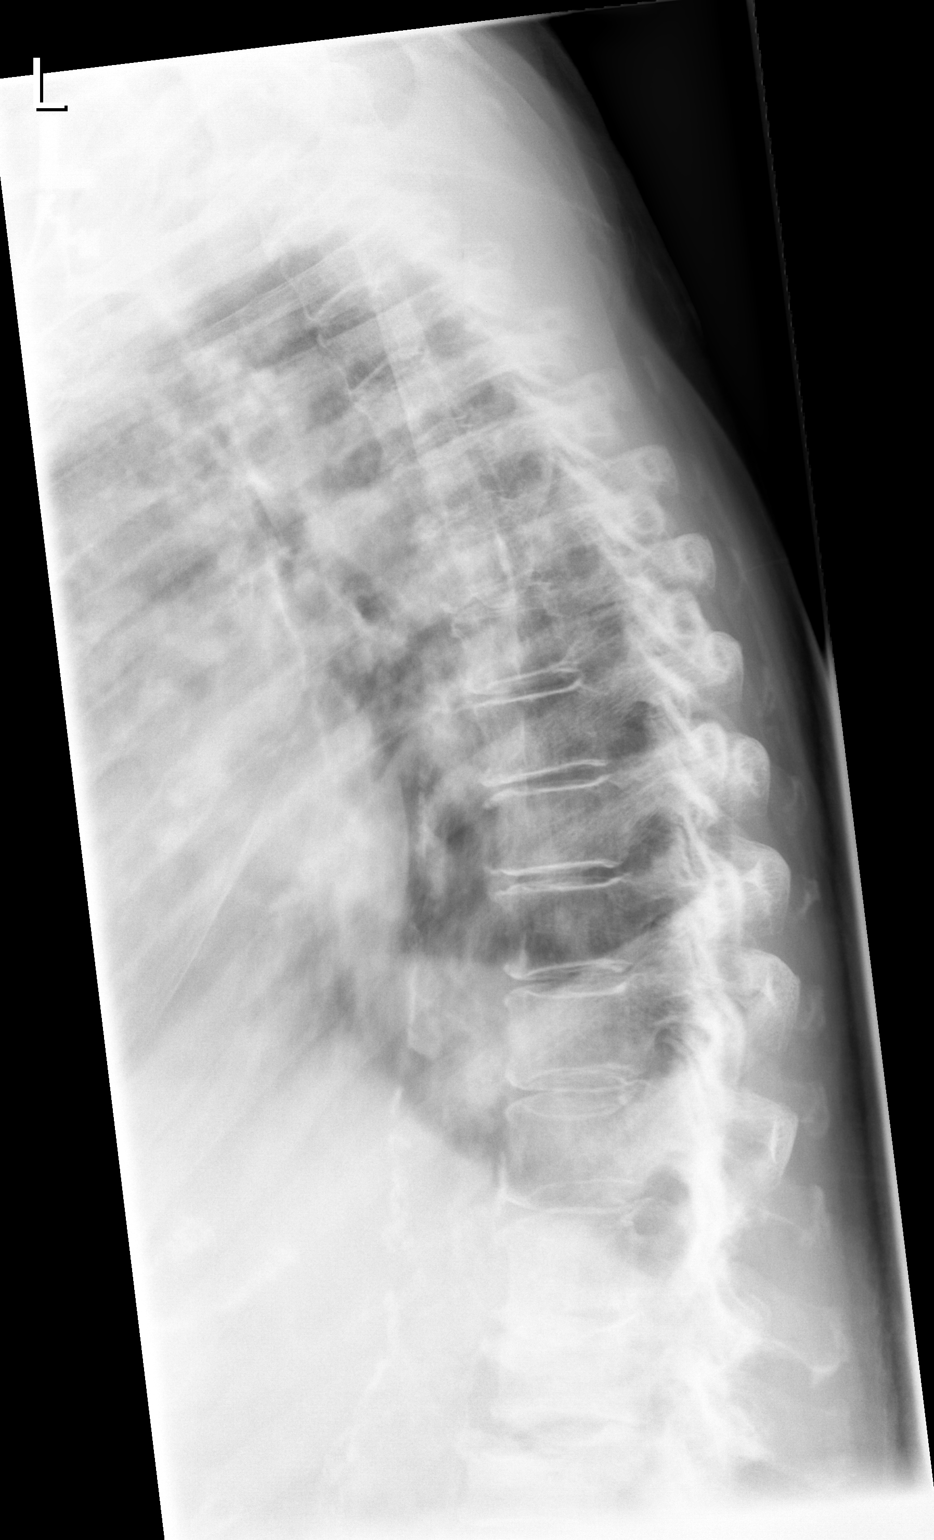

[t swimmers]
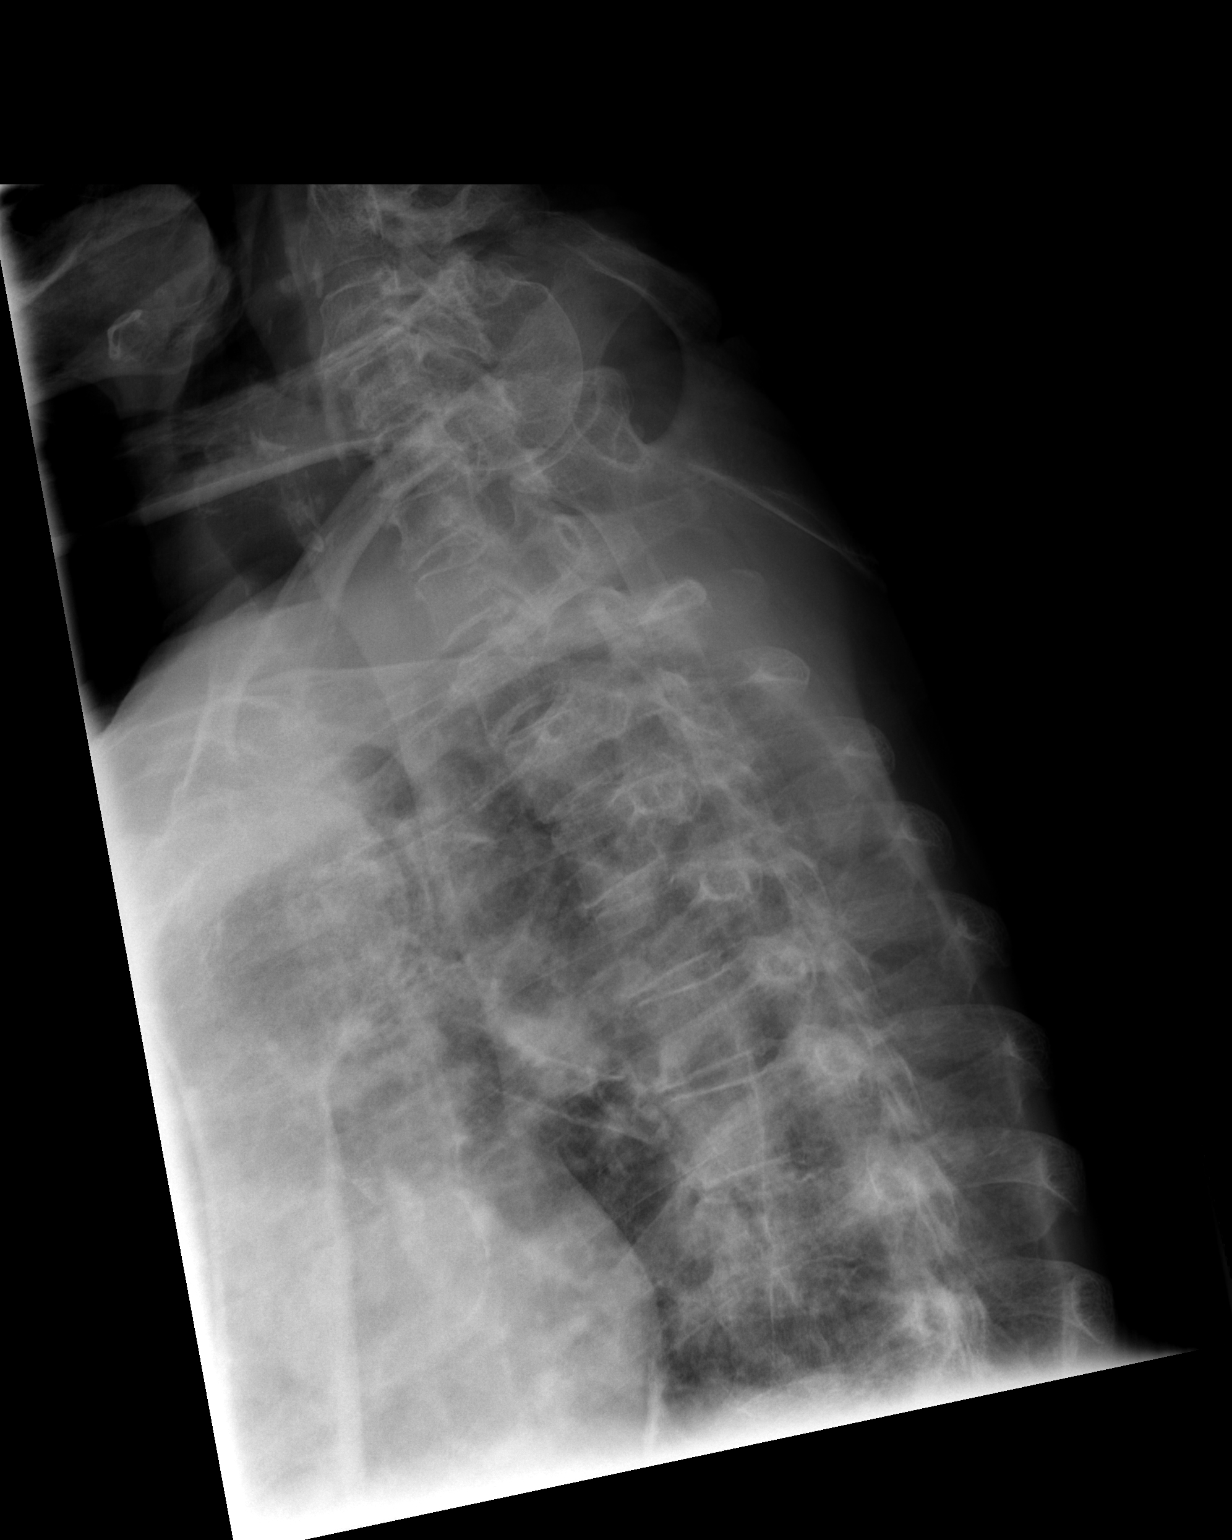

[3 of 3 positions shown; findings below may reference images not displayed]

FINDINGS: Normal thoracic segmentation. Stable bone mineralization. Thoracic
vertebral height and alignment appears stable. Cervicothoracic
junction alignment is within normal limits. There is evidence of an
L3 compression fracture on the AP view. Extensive aortic calcified
atherosclerosis.
IMPRESSION: 1. No acute fracture or listhesis identified in the thoracic spine.

2. Evidence of an L3 compression fracture. Query pain at this level.

3. Advanced thoracic calcified atherosclerosis.
